# Patient Record
Sex: Female | Born: 1970 | Hispanic: No | Smoking: Current every day smoker
Health system: Southern US, Community
[De-identification: ages and names within clinical notes are randomized; demographics above are authoritative.]

## PROBLEM LIST (undated history)

## (undated) DIAGNOSIS — E119 Type 2 diabetes mellitus without complications: Secondary | ICD-10-CM

## (undated) DIAGNOSIS — J45909 Unspecified asthma, uncomplicated: Secondary | ICD-10-CM

## (undated) DIAGNOSIS — E785 Hyperlipidemia, unspecified: Secondary | ICD-10-CM

## (undated) HISTORY — PX: FEMUR SURGERY: SHX943

## (undated) HISTORY — PX: ARTHROSCOPIC REPAIR ACL: SUR80

---

## 2013-01-02 ENCOUNTER — Other Ambulatory Visit: Payer: Self-pay | Admitting: Family Medicine

## 2013-01-02 DIAGNOSIS — Z1231 Encounter for screening mammogram for malignant neoplasm of breast: Secondary | ICD-10-CM

## 2013-01-24 ENCOUNTER — Ambulatory Visit: Payer: Self-pay

## 2013-05-06 ENCOUNTER — Encounter (HOSPITAL_COMMUNITY): Payer: Self-pay | Admitting: *Deleted

## 2013-05-06 ENCOUNTER — Emergency Department (HOSPITAL_COMMUNITY)
Admission: EM | Admit: 2013-05-06 | Discharge: 2013-05-07 | Disposition: A | Discharge status: Home / Self Care | Payer: Medicare PPO | Attending: Emergency Medicine | Admitting: Emergency Medicine

## 2013-05-06 DIAGNOSIS — K0889 Other specified disorders of teeth and supporting structures: Secondary | ICD-10-CM

## 2013-05-06 DIAGNOSIS — Z880 Allergy status to penicillin: Secondary | ICD-10-CM | POA: Insufficient documentation

## 2013-05-06 DIAGNOSIS — F17200 Nicotine dependence, unspecified, uncomplicated: Secondary | ICD-10-CM | POA: Insufficient documentation

## 2013-05-06 DIAGNOSIS — J45909 Unspecified asthma, uncomplicated: Secondary | ICD-10-CM | POA: Insufficient documentation

## 2013-05-06 DIAGNOSIS — Z79899 Other long term (current) drug therapy: Secondary | ICD-10-CM | POA: Insufficient documentation

## 2013-05-06 DIAGNOSIS — K089 Disorder of teeth and supporting structures, unspecified: Secondary | ICD-10-CM | POA: Insufficient documentation

## 2013-05-06 HISTORY — DX: Unspecified asthma, uncomplicated: J45.909

## 2013-05-06 MED ORDER — OXYCODONE-ACETAMINOPHEN 5-325 MG PO TABS
2.0000 | ORAL_TABLET | Freq: Once | ORAL | Status: AC
  Administered 2013-05-06: 2 via ORAL
  Filled 2013-05-06: qty 2

## 2013-05-06 MED ORDER — AZITHROMYCIN 250 MG PO TABS: 250.0000 mg | ORAL_TABLET | Freq: Every day | ORAL | Status: DC

## 2013-05-06 MED ORDER — DIPHENHYDRAMINE HCL 25 MG PO TABS: 25.0000 mg | ORAL_TABLET | Freq: Four times a day (QID) | ORAL | Status: DC | PRN

## 2013-05-06 MED ORDER — ONDANSETRON 4 MG PO TBDP
8.0000 mg | ORAL_TABLET | Freq: Once | ORAL | Status: AC
  Administered 2013-05-06: 8 mg via ORAL
  Filled 2013-05-06: qty 2

## 2013-05-06 MED ORDER — OXYCODONE-ACETAMINOPHEN 5-325 MG PO TABS: 2.0000 | ORAL_TABLET | Freq: Four times a day (QID) | ORAL | Status: DC | PRN

## 2013-05-06 MED ORDER — PENICILLIN V POTASSIUM 500 MG PO TABS: 500.0000 mg | ORAL_TABLET | Freq: Four times a day (QID) | ORAL | Status: DC

## 2013-05-06 MED ORDER — PROMETHAZINE HCL 25 MG PO TABS: 25.0000 mg | ORAL_TABLET | Freq: Four times a day (QID) | ORAL | Status: DC | PRN

## 2013-05-06 NOTE — ED Notes (Signed)
Abscess tooth in the lower front. ? Need for dental extraction.

## 2013-05-06 NOTE — ED Provider Notes (Signed)
History    CSN: [id number] Arrival date & time 05/06/13  1931  First MD Initiated Contact with Patient 05/06/13 2208     Chief Complaint  Patient presents with  . Dental Pain   (Consider location/radiation/quality/duration/timing/severity/associated sxs/prior Treatment) HPI Comments: Patient is a 42 year old female with history of teeth extraction who presents today with lower front tooth pain since yesterday. It is a dull ache without radiation. She states there was draining pus earlier today. She has not done anything for the pain. She called a few dentist offices, but states she cannot afford the $200 they are asking for. No fevers, chills, nausea, vomiting, abdominal pain.  Patient is a 42 y.o. female presenting with tooth pain. The history is provided by the patient. No language interpreter was used.  Dental Pain Associated symptoms: no drooling and no fever    Past Medical History  Diagnosis Date  . Asthma    Past Surgical History  Procedure Laterality Date  . Arthroscopic repair acl    . Femur surgery     No family history on file. History  Substance Use Topics  . Smoking status: Current Every Day Smoker  . Smokeless tobacco: Not on file  . Alcohol Use: No   OB History   Grav Para Term Preterm Abortions TAB SAB Ect Mult Living                 Review of Systems  Constitutional: Negative for fever and chills.  HENT: Positive for dental problem. Negative for drooling.   Gastrointestinal: Negative for nausea, vomiting and abdominal pain.  All other systems reviewed and are negative.    Allergies  Contrast media; Penicillins; and Zithromax  Home Medications   Current Outpatient Rx  Name  Route  Sig  Dispense  Refill  . albuterol (PROVENTIL HFA;VENTOLIN HFA) 108 (90 BASE) MCG/ACT inhaler   Inhalation   Inhale 2 puffs into the lungs every 6 (six) hours as needed for wheezing. For wheezing         . ibuprofen (ADVIL,MOTRIN) 200 MG tablet   Oral   Take  1,000 mg by mouth 2 (two) times daily as needed for pain. For pain         . naproxen sodium (ANAPROX) 220 MG tablet   Oral   Take 220 mg by mouth daily as needed. For pain          BP 143/87  Pulse 97  Temp(Src) 98.2 F (36.8 C) (Oral)  Resp 16  SpO2 100%  LMP 04/09/2013 Physical Exam  Nursing note and vitals reviewed. Constitutional: She is oriented to person, place, and time. She appears well-developed and well-nourished. No distress.  HENT:  Head: Normocephalic and atraumatic.  Right Ear: External ear normal.  Left Ear: External ear normal.  Nose: Nose normal.  Mouth/Throat: Uvula is midline and oropharynx is clear and moist.    No trismus, submental edema, tongue elevation Gum erythema; no drainable abscess  Eyes: Conjunctivae are normal.  Neck: Normal range of motion.  Cardiovascular: Normal rate, regular rhythm and normal heart sounds.   Pulmonary/Chest: Effort normal and breath sounds normal. No stridor. No respiratory distress. She has no wheezes. She has no rales.  Abdominal: Soft. She exhibits no distension.  Musculoskeletal: Normal range of motion.  Neurological: She is alert and oriented to person, place, and time. She has normal strength.  Skin: Skin is warm and dry. She is not diaphoretic. No erythema.  Psychiatric: She has a normal mood  and affect. Her behavior is normal.    ED Course  Procedures (including critical care time) Labs Reviewed - No data to display  No results found.  1. Pain, dental     MDM  Patient with toothache.  No gross abscess.  Exam unconcerning for Ludwig's angina or spread of infection.  Will treat with azithromycin due to pcn allergy and pain medicine.  Urged patient to follow-up with dentist.     Mora Bellman, PA-C 05/07/13 0117

## 2013-05-08 NOTE — ED Provider Notes (Signed)
Medical screening examination/treatment/procedure(s) were performed by non-physician practitioner and as supervising physician I was immediately available for consultation/collaboration.  Taiquan Campanaro R. Marialena Wollen, MD 05/08/13 1552 

## 2013-07-29 ENCOUNTER — Emergency Department (HOSPITAL_COMMUNITY)
Admission: EM | Admit: 2013-07-29 | Discharge: 2013-07-30 | Disposition: A | Discharge status: Home / Self Care | Payer: Medicare PPO | Attending: Emergency Medicine | Admitting: Emergency Medicine

## 2013-07-29 ENCOUNTER — Encounter (HOSPITAL_COMMUNITY): Payer: Self-pay | Admitting: Emergency Medicine

## 2013-07-29 DIAGNOSIS — M25551 Pain in right hip: Secondary | ICD-10-CM

## 2013-07-29 DIAGNOSIS — M545 Low back pain, unspecified: Secondary | ICD-10-CM | POA: Insufficient documentation

## 2013-07-29 DIAGNOSIS — J45909 Unspecified asthma, uncomplicated: Secondary | ICD-10-CM | POA: Insufficient documentation

## 2013-07-29 DIAGNOSIS — M25559 Pain in unspecified hip: Secondary | ICD-10-CM | POA: Insufficient documentation

## 2013-07-29 DIAGNOSIS — Z888 Allergy status to other drugs, medicaments and biological substances status: Secondary | ICD-10-CM | POA: Insufficient documentation

## 2013-07-29 DIAGNOSIS — Z880 Allergy status to penicillin: Secondary | ICD-10-CM | POA: Insufficient documentation

## 2013-07-29 DIAGNOSIS — Z79899 Other long term (current) drug therapy: Secondary | ICD-10-CM | POA: Insufficient documentation

## 2013-07-29 DIAGNOSIS — Z881 Allergy status to other antibiotic agents status: Secondary | ICD-10-CM | POA: Insufficient documentation

## 2013-07-29 DIAGNOSIS — F411 Generalized anxiety disorder: Secondary | ICD-10-CM | POA: Insufficient documentation

## 2013-07-29 DIAGNOSIS — F17200 Nicotine dependence, unspecified, uncomplicated: Secondary | ICD-10-CM | POA: Insufficient documentation

## 2013-07-29 DIAGNOSIS — E119 Type 2 diabetes mellitus without complications: Secondary | ICD-10-CM | POA: Insufficient documentation

## 2013-07-29 DIAGNOSIS — M549 Dorsalgia, unspecified: Secondary | ICD-10-CM

## 2013-07-29 HISTORY — DX: Type 2 diabetes mellitus without complications: E11.9

## 2013-07-29 MED ORDER — OXYCODONE-ACETAMINOPHEN 5-325 MG PO TABS
2.0000 | ORAL_TABLET | Freq: Once | ORAL | Status: AC
  Administered 2013-07-29: 2 via ORAL
  Filled 2013-07-29: qty 2

## 2013-07-29 NOTE — ED Provider Notes (Signed)
CSN: [id number]     Arrival date & time 07/29/13  1853 History  This chart was scribed for non-physician practitioner, Coral Ceo, PA-C working with Raeford Razor, MD by Greggory Stallion, ED scribe. This patient was seen in room TR10C/TR10C and the patient's care was started at 10:55 PM.   Chief Complaint  Patient presents with  . Back Pain  . Hip Pain   The history is provided by the patient. No language interpreter was used.    HPI Comments: Kathleen Marks is a 42 y.o. female with a PMH of DM and asthma who presents to the Emergency Department complaining of back and hip pain.  Her pain has been present for several months after a work related injury.  She also makes some comment about a right hip problem prior to this injury, "I think I broke it" in the past but is unable to elaborate.  She states that she went to physical therapy today for her back pain. When she was getting out of the car after physical therapy she felt a "pop" in her right hip.  She did not fall.  She has been able to ambulate after this.  She is worried she "dislocated or fractured" her hip.  Her pain is located in the right lower back with radiation to her right hip.  Her pain is worse with movement and ambulation.  She has been taking hydrocodone and Meloxicam with no relief at home.  She took a hydrocodone prior to arrival.  Pt denies urinary or bowel incontinence, loss of sensation, numbness, tingling, or weakness. No fevers, chills, change in appetite/activity, chest pain, SOB, abdominal pain, nausea, emesis, dysuria, hematuria, or leg edema.     Past Medical History  Diagnosis Date  . Asthma   . Diabetes mellitus without complication    Past Surgical History  Procedure Laterality Date  . Arthroscopic repair acl    . Femur surgery     No family history on file. History  Substance Use Topics  . Smoking status: Current Every Day Smoker  . Smokeless tobacco: Not on file  . Alcohol Use: No   OB History    Grav Para Term Preterm Abortions TAB SAB Ect Mult Living                 Review of Systems  Constitutional: Negative for fever, chills, activity change, appetite change and fatigue.  HENT: Negative for congestion, sore throat, rhinorrhea, neck pain and neck stiffness.   Eyes: Negative for visual disturbance.  Respiratory: Negative for cough and shortness of breath.   Cardiovascular: Negative for chest pain and leg swelling.  Gastrointestinal: Negative for nausea, vomiting and abdominal pain.  Genitourinary: Negative for dysuria and difficulty urinating.       Denies urinary or bowel incontinence.   Musculoskeletal: Positive for back pain. Negative for myalgias, joint swelling and gait problem.  Skin: Negative for rash and wound.  Neurological: Negative for dizziness, syncope, weakness, light-headedness, numbness and headaches.  All other systems reviewed and are negative.    Allergies  Contrast media; Penicillins; and Zithromax  Home Medications   Current Outpatient Rx  Name  Route  Sig  Dispense  Refill  . albuterol (PROVENTIL HFA;VENTOLIN HFA) 108 (90 BASE) MCG/ACT inhaler   Inhalation   Inhale 2 puffs into the lungs every 6 (six) hours as needed for wheezing or shortness of breath.          . diphenhydrAMINE (BENADRYL) 25 MG tablet   Oral  Take 25 mg by mouth every 6 (six) hours as needed for itching.         Marland Kitchen ibuprofen (ADVIL,MOTRIN) 200 MG tablet   Oral   Take 1,000 mg by mouth 2 (two) times daily as needed for pain.          . naproxen sodium (ANAPROX) 220 MG tablet   Oral   Take 220 mg by mouth daily as needed (pain).           BP 129/67  Pulse 93  Temp(Src) 98.1 F (36.7 C) (Oral)  Resp 18  SpO2 100%  LMP 07/25/2013  Filed Vitals:   07/29/13 1921 07/29/13 2305 07/30/13 0129  BP: 129/67 150/98 134/86  Pulse: 93 101 83  Temp: 98.1 F (36.7 C)  97 F (36.1 C)  TempSrc: Oral Oral Oral  Resp: 18 20   SpO2: 100% 100%     Physical Exam   Nursing note and vitals reviewed. Constitutional: She is oriented to person, place, and time. She appears well-developed and well-nourished. No distress.  Patient is anxious and crying   HENT:  Head: Normocephalic and atraumatic.  Right Ear: External ear normal.  Left Ear: External ear normal.  Nose: Nose normal.  Eyes: Conjunctivae and EOM are normal. Right eye exhibits no discharge. Left eye exhibits no discharge.  Neck: Normal range of motion. Neck supple. No tracheal deviation present.  Cardiovascular: Normal rate, regular rhythm, normal heart sounds and intact distal pulses.  Exam reveals no gallop and no friction rub.   No murmur heard. Dorsalis pedis pulses present and equal bilaterally  Pulmonary/Chest: Effort normal and breath sounds normal. No respiratory distress. She has no wheezes. She has no rales. She exhibits no tenderness.  Abdominal: Soft. Bowel sounds are normal. She exhibits no distension. There is no tenderness.  Musculoskeletal: Normal range of motion. She exhibits tenderness. She exhibits no edema.  Diffuse tenderness to palpation to the lower lumbar right paraspinal muscles with no lumbar spinal tenderness.  Diffuse tenderness to palpation of the right lateral hip.  No thigh, knee, or ankle tenderness to palpation on the right.  Patient able to flex and extend right knee without difficulty or limitations.  Dorsiflexion and plantarflexion intact.  No pedal edema bilaterally  Neurological: She is alert and oriented to person, place, and time.  Gross sensation intact in the lower extremities bilaterally  Skin: Skin is warm and dry. She is not diaphoretic.  No ecchymosis, edema, erythema, or wounds to the lower back or lower extremities throughout  Psychiatric: She has a normal mood and affect. Her behavior is normal.    ED Course  Procedures (including critical care time)  DIAGNOSTIC STUDIES: Oxygen Saturation is 100% on RA, normal by my interpretation.     COORDINATION OF CARE: 10:57 PM-Discussed treatment plan which includes percocet with pt at bedside and pt agreed to plan.   Labs Review Labs Reviewed - No data to display Imaging Review No results found.  DG Hip Complete Right (Final result)  Result time: 07/30/13 00:51:47    Final result by Rad Results In Interface (07/30/13 00:51:47)    Narrative:   CLINICAL DATA: Right hip and low back pain. No acute injury. Patient reports right hip injury in 2007.  EXAM: RIGHT HIP - COMPLETE 2+ VIEW  COMPARISON: None.  FINDINGS: The mineralization and alignment are normal. There is no evidence of acute fracture or dislocation. The hip joint spaces are preserved. There is no evidence of femoral head avascular  necrosis. Mild osteitis pubis and mild sacroiliac degenerative changes are present bilaterally.  IMPRESSION: No acute osseous findings. Mild osteitis pubis.   Electronically Signed By: Roxy Horseman On: 07/30/2013 00:51             DG Lumbar Spine Complete (Final result)  Result time: 07/30/13 00:50:58    Final result by Rad Results In Interface (07/30/13 00:50:58)    Narrative:   CLINICAL DATA: Low back pain on the right.  EXAM: LUMBAR SPINE - COMPLETE 4+ VIEW  COMPARISON: None.  FINDINGS: No fracture or subluxation is identified. Intervertebral disc space height is maintained. There is some facet arthropathy at L4-5 and L5-S1.  IMPRESSION: No acute or focal abnormality. Facet degenerative change lower lumbar spine.   Electronically Signed By: Drusilla Kanner M.D. On: 07/30/2013 00:50   Results for orders placed during the hospital encounter of 07/29/13  GLUCOSE, CAPILLARY      Result Value Range   Glucose-Capillary 127 (*) 70 - 99 mg/dL    MDM   1. Back pain   2. Right hip pain    Merlean Bucio is a 42 y.o. female with a PMH of DM and asthma who presents to the Emergency Department complaining of back and hip pain.  X-rays of the lumbar  spine and right hip were ordered to further evaluate.  Percocet was ordered for symptomatic relief.     Rechecks  12:45 AM = Patient appears more comfortable.  States her pain is improved but she is now nauseated.  ODT Zofran ordered.  Patient had an episode of emesis while I was in the room.  Able to sit up with ease.   1:15 AM = Patient states nausea has improved.  She states "pain medications make me sick."  She is ambulating around the room without difficulty or ataxia.  Ready for discharge.     Etiology of back and hip pain is likely chronic in nature.  X-rays were negative for fracture or malalignment.  There is no mention of previous fx or surgical hardware on x-rays.  Patient was neurovascularly intact.  She was able to ambulate without difficulty or ataxia before discharge.  Patient was prescribed Zofran and Flexeril for outpatient management.  Patient states she has enough pain medication at home.  She was instructed not to drink or drive while taking Flexeril.  Patient was instructed to return to the ED if they experience any weakness, loss of bowel or bladder function, or other concerns.  Patient will call her case manager regarding her workman's comp plan of further care.  Patient was in agreement with discharge and plan.     Final impressions: 1. Back pain 2. Right hip pain     Thomasenia Sales   I personally performed the services described in this documentation, which was scribed in my presence. The recorded information has been reviewed and is accurate.   Jillyn Ledger, PA-C 07/30/13 1515

## 2013-07-29 NOTE — ED Notes (Signed)
Pt upset requesting something for pain.  This RN informed pt that she needed to be seen first by provider before medication can be administered.  Pt visibly upset stating "You don't seem to understand.  I was outside for 2 hours and you guys let me stay in pain out there.  Now I am back here and I am still in pain and haven't been seen by anybody."  This RN attempted to explain to pt that this area was busy and the provider was trying to work as fast as she could to see pts.  "You don't seem to care.  Please go get me your supervisor."  Charge nurse made aware of pt's request.

## 2013-07-29 NOTE — ED Notes (Addendum)
Pt. Reports low back pain for 2 months and right hip pain today after receiving physical therapy , currently taking Hydrocodone and Meloxicam with no relief . Denies urinary discomfort.

## 2013-07-30 ENCOUNTER — Emergency Department (HOSPITAL_COMMUNITY): Payer: Medicare PPO

## 2013-07-30 LAB — GLUCOSE, CAPILLARY: Glucose-Capillary: 127 mg/dL — ABNORMAL HIGH (ref 70–99)

## 2013-07-30 MED ORDER — ONDANSETRON 4 MG PO TBDP: 4.0000 mg | ORAL_TABLET | Freq: Three times a day (TID) | ORAL | Status: DC | PRN

## 2013-07-30 MED ORDER — CYCLOBENZAPRINE HCL 5 MG PO TABS: 5.0000 mg | ORAL_TABLET | Freq: Three times a day (TID) | ORAL | Status: DC | PRN

## 2013-07-30 MED ORDER — ONDANSETRON 4 MG PO TBDP
4.0000 mg | ORAL_TABLET | Freq: Once | ORAL | Status: AC
  Administered 2013-07-30: 4 mg via ORAL
  Filled 2013-07-30: qty 1

## 2013-07-30 NOTE — ED Notes (Signed)
Family at bedside. 

## 2013-08-01 NOTE — ED Provider Notes (Signed)
Medical screening examination/treatment/procedure(s) were performed by non-physician practitioner and as supervising physician I was immediately available for consultation/collaboration.  Tamico Mundo, MD 08/01/13 0612 

## 2014-05-21 ENCOUNTER — Encounter (HOSPITAL_COMMUNITY): Payer: Self-pay | Admitting: Emergency Medicine

## 2014-05-21 ENCOUNTER — Emergency Department (HOSPITAL_COMMUNITY): Payer: Medicare Other

## 2014-05-21 ENCOUNTER — Emergency Department (HOSPITAL_COMMUNITY)
Admission: EM | Admit: 2014-05-21 | Discharge: 2014-05-21 | Disposition: A | Discharge status: Home / Self Care | Payer: Medicare Other | Attending: Emergency Medicine | Admitting: Emergency Medicine

## 2014-05-21 DIAGNOSIS — Z880 Allergy status to penicillin: Secondary | ICD-10-CM | POA: Insufficient documentation

## 2014-05-21 DIAGNOSIS — J069 Acute upper respiratory infection, unspecified: Secondary | ICD-10-CM | POA: Insufficient documentation

## 2014-05-21 DIAGNOSIS — F17200 Nicotine dependence, unspecified, uncomplicated: Secondary | ICD-10-CM | POA: Insufficient documentation

## 2014-05-21 DIAGNOSIS — Z791 Long term (current) use of non-steroidal anti-inflammatories (NSAID): Secondary | ICD-10-CM | POA: Insufficient documentation

## 2014-05-21 DIAGNOSIS — Z79899 Other long term (current) drug therapy: Secondary | ICD-10-CM | POA: Insufficient documentation

## 2014-05-21 DIAGNOSIS — E119 Type 2 diabetes mellitus without complications: Secondary | ICD-10-CM | POA: Insufficient documentation

## 2014-05-21 DIAGNOSIS — J45901 Unspecified asthma with (acute) exacerbation: Secondary | ICD-10-CM | POA: Insufficient documentation

## 2014-05-21 MED ORDER — IPRATROPIUM-ALBUTEROL 0.5-2.5 (3) MG/3ML IN SOLN
3.0000 mL | Freq: Once | RESPIRATORY_TRACT | Status: AC
  Administered 2014-05-21: 3 mL via RESPIRATORY_TRACT
  Filled 2014-05-21: qty 3

## 2014-05-21 MED ORDER — PREDNISONE 20 MG PO TABS
60.0000 mg | ORAL_TABLET | Freq: Once | ORAL | Status: AC
  Administered 2014-05-21: 60 mg via ORAL
  Filled 2014-05-21: qty 3

## 2014-05-21 MED ORDER — ALBUTEROL SULFATE HFA 108 (90 BASE) MCG/ACT IN AERS: 2.0000 | INHALATION_SPRAY | RESPIRATORY_TRACT | Status: DC | PRN

## 2014-05-21 MED ORDER — GUAIFENESIN-CODEINE 100-10 MG/5ML PO SOLN
10.0000 mL | Freq: Once | ORAL | Status: AC
  Administered 2014-05-21: 10 mL via ORAL
  Filled 2014-05-21: qty 10

## 2014-05-21 MED ORDER — GUAIFENESIN-CODEINE 100-10 MG/5ML PO SOLN: 10.0000 mL | Freq: Four times a day (QID) | ORAL | Status: DC | PRN

## 2014-05-21 MED ORDER — PREDNISONE 20 MG PO TABS: 40.0000 mg | ORAL_TABLET | Freq: Every day | ORAL | Status: DC

## 2014-05-21 NOTE — Discharge Instructions (Signed)
Please follow up with your primary care physician in 1-2 days. If you do not have one please [facility] the [facility] listed above. Sick medications as prescribed. Please use her albuterol inhaler 1-2 puffs every 4-6 hours for the next few days to help with cough and respiratory symptoms. Please do not drive after taking Robitussin with codeine. Please read all discharge instructions and return precautions.   Upper Respiratory Infection, Adult An upper respiratory infection (URI) is also known as the common cold. It is often caused by a type of germ (virus). Colds are easily spread (contagious). You can pass it to others by kissing, coughing, sneezing, or drinking out of the same glass. Usually, you get better in 1 or 2 weeks.  HOME CARE   Only take medicine as told by your doctor.  Use a warm mist humidifier or breathe in steam from a hot shower.  Drink enough water and fluids to keep your pee (urine) clear or pale yellow.  Get plenty of rest.  Return to work when your temperature is back to normal or as told by your doctor. You may use a face mask and wash your hands to stop your cold from spreading. GET HELP RIGHT AWAY IF:   After the first few days, you feel you are getting worse.  You have questions about your medicine.  You have chills, shortness of breath, or brown or red spit (mucus).  You have yellow or brown snot (nasal discharge) or pain in the face, especially when you bend forward.  You have a fever, puffy (swollen) neck, pain when you swallow, or white spots in the back of your throat.  You have a bad headache, ear pain, sinus pain, or chest pain.  You have a high-pitched whistling sound when you breathe in and out (wheezing).  You have a lasting cough or cough up blood.  You have sore muscles or a stiff neck. MAKE SURE YOU:   Understand these instructions.  Will watch your condition.  Will get help right away if you are not doing well or  get worse. Document Released: 04/11/2008 Document Revised: 01/16/2012 Document Reviewed: 02/28/2011 [facility] Patient Information 2015 [place], [place]. This information is not intended to replace advice given to you by your health care provider. Make sure you discuss any questions you have with your health care provider.

## 2014-05-21 NOTE — ED Provider Notes (Signed)
Medical screening examination/treatment/procedure(s) were performed by non-physician practitioner and as supervising physician I was immediately available for consultation/collaboration.   EKG Interpretation None       Jerah Esty K Demarqus Jocson-Rasch, MD 05/21/14 0600

## 2014-05-21 NOTE — ED Provider Notes (Signed)
CSN: [id number]     Arrival date & time 05/21/14  0438 History   First MD Initiated Contact with Patient 05/21/14 [phone]     Chief Complaint  Patient presents with  . Shortness of Breath     (Consider location/radiation/quality/duration/timing/severity/associated sxs/prior Treatment) HPI Comments: Patient is a 43 year old female past medical history significant for asthma, DM, tobacco abuse presenting to the emergency department for 4-5 day history of nasal congestion, rhinorrhea, productive cough with yellow sputum, shortness of breath chest tightness. She states her symptoms have been gradually worsening. Alleviating factors: none. Aggravating factors: laying down at night, exertion. Medications tried prior to arrival: Robitussin, Tussionex, Mucinex. Patient states her daughter and husband are both sick at home with similar symptoms. Denies any fevers, chills, nausea, vomiting, diarrhea, abdominal pain. PERC negative.     Patient is a 43 y.o. female presenting with shortness of breath.  Shortness of Breath Associated symptoms: wheezing   Associated symptoms: no abdominal pain, no fever and no vomiting     Past Medical History  Diagnosis Date  . Asthma   . Diabetes mellitus without complication    Past Surgical History  Procedure Laterality Date  . Arthroscopic repair acl    . Femur surgery     No family history on file. History  Substance Use Topics  . Smoking status: Current Every Day Smoker -- 0.50 packs/day    Types: Cigarettes  . Smokeless tobacco: Not on file  . Alcohol Use: No   OB History   Grav Para Term Preterm Abortions TAB SAB Ect Mult Living                 Review of Systems  Constitutional: Negative for fever and chills.  Respiratory: Positive for chest tightness, shortness of breath and wheezing.   Gastrointestinal: Negative for nausea, vomiting and abdominal pain.  All other systems reviewed and are negative.     Allergies  Contrast media;  Penicillins; Tetanus toxoids; and Zithromax  Home Medications   Prior to Admission medications   Medication Sig Start Date End Date Taking? Authorizing Provider  acetaminophen (TYLENOL) 500 MG tablet Take 500-1,000 mg by mouth every 6 (six) hours as needed.   Yes Historical Provider, MD  diphenhydrAMINE (BENADRYL) 25 MG tablet Take 25 mg by mouth every 6 (six) hours as needed for itching.   Yes Historical Provider, MD  guaiFENesin (MUCINEX) 600 MG 12 hr tablet Take 600 mg by mouth 2 (two) times daily as needed. For cold symptoms   Yes Historical Provider, MD  guaifenesin (ROBITUSSIN) 100 MG/5ML syrup Take 200 mg by mouth 3 (three) times daily as needed for cough.   Yes Historical Provider, MD  ibuprofen (ADVIL,MOTRIN) 200 MG tablet Take 600-800 mg by mouth 2 (two) times daily as needed for pain.    Yes Historical Provider, MD  albuterol (PROVENTIL HFA;VENTOLIN HFA) 108 (90 BASE) MCG/ACT inhaler Inhale 2 puffs into the lungs every 6 (six) hours as needed for wheezing or shortness of breath.     Historical Provider, MD  albuterol (PROVENTIL HFA;VENTOLIN HFA) 108 (90 BASE) MCG/ACT inhaler Inhale 2 puffs into the lungs every 4 (four) hours as needed for wheezing or shortness of breath. 05/21/14   Altan Kraai L Canden Cieslinski, PA-C  guaiFENesin-codeine 100-10 MG/5ML syrup Take 10 mLs by mouth every 6 (six) hours as needed for cough. 05/21/14   Jhonatan Lomeli L Trevionne Advani, PA-C  predniSONE (DELTASONE) 20 MG tablet Take 2 tablets (40 mg total) by mouth daily. 05/21/14   Victorino Dike  L Velicia Dejager, PA-C   BP 136/82  Pulse 96  Temp(Src) 97.9 F (36.6 C) (Oral)  Resp 20  Ht 5\' 3"  (1.6 m)  Wt 270 lb (122.471 kg)  BMI 47.84 kg/m2  SpO2 98%  LMP 04/24/2014 Physical Exam  Nursing note and vitals reviewed. Constitutional: She is oriented to person, place, and time. She appears well-developed and well-nourished. No distress.  HENT:  Head: Normocephalic and atraumatic.  Right Ear: Hearing, tympanic membrane, external  ear and ear canal normal.  Left Ear: Hearing, tympanic membrane, external ear and ear canal normal.  Nose: Rhinorrhea present.  Mouth/Throat: Uvula is midline, oropharynx is clear and moist and mucous membranes are normal. No trismus in the jaw. No uvula swelling. No oropharyngeal exudate.  Eyes: Conjunctivae are normal.  Neck: Neck supple.  Cardiovascular: Normal rate, regular rhythm and normal heart sounds.   Pulmonary/Chest: Effort normal. No respiratory distress. She has wheezes (mild expiratory). She exhibits tenderness.  Abdominal: Soft. Bowel sounds are normal. There is no tenderness.  Musculoskeletal: She exhibits no edema.  MAE x 4  Lymphadenopathy:    She has no cervical adenopathy.  Neurological: She is alert and oriented to person, place, and time.  Skin: Skin is warm and dry. She is not diaphoretic.    ED Course  Procedures (including critical care time) Medications  ipratropium-albuterol (DUONEB) 0.5-2.5 (3) MG/3ML nebulizer solution 3 mL (3 mLs Nebulization Given 05/21/14 0524)  predniSONE (DELTASONE) tablet 60 mg (60 mg Oral Given 05/21/14 0524)  guaiFENesin-codeine 100-10 MG/5ML solution 10 mL (10 mLs Oral Given 05/21/14 0524)    Labs Review Labs Reviewed - No data to display  Imaging Review Dg Chest 2 View  05/21/2014   CLINICAL DATA:  Shortness of breath  EXAM: CHEST  2 VIEW  COMPARISON:  None.  FINDINGS: Normal heart size and mediastinal contours. No acute infiltrate or edema. No effusion or pneumothorax. No acute osseous findings.  IMPRESSION: No active cardiopulmonary disease.   Electronically Signed   By: Tiburcio PeaJonathan  Watts M.D.   On: 05/21/2014 05:31     EKG Interpretation None      MDM   Final diagnoses:  URI (upper respiratory infection)    Filed Vitals:   05/21/14 0444  BP: 136/82  Pulse: 96  Temp: 97.9 F (36.6 C)  Resp: 20     Afebrile, NAD, non-toxic appearing, AAOx4.  Pt CXR negative for acute infiltrate. Patients symptoms are consistent  with URI, likely viral etiology. Discussed that antibiotics are not indicated for viral infections. Mild expiratory wheeze improved with DuoNeb administration. Pt will be discharged with symptomatic treatment.  Verbalizes understanding and is agreeable with plan. Pt is hemodynamically stable & in NAD prior to dc. Patient d/w with Dr. Nicanor AlconPalumbo, agrees with plan.     Jeannetta EllisJennifer L Gladine Plude, PA-C 05/21/14 [phone]

## 2014-05-21 NOTE — ED Notes (Signed)
Pt states that she has had cold sx x 4-5 days; pt states that she has a history of asthma; pt states that she has become more short of breath over the last 4-5 days; pt reports productive cough with yellow colored sputum; no cough in triage; pt states the shortness of breath is worse with exertion

## 2014-09-08 IMAGING — CR DG CHEST 2V
2 series · 2 of 2 positions shown · non-contrast
Comparison: None.

CLINICAL DATA: Shortness of breath

EXAM:
CHEST  2 VIEW

[w chest pa]
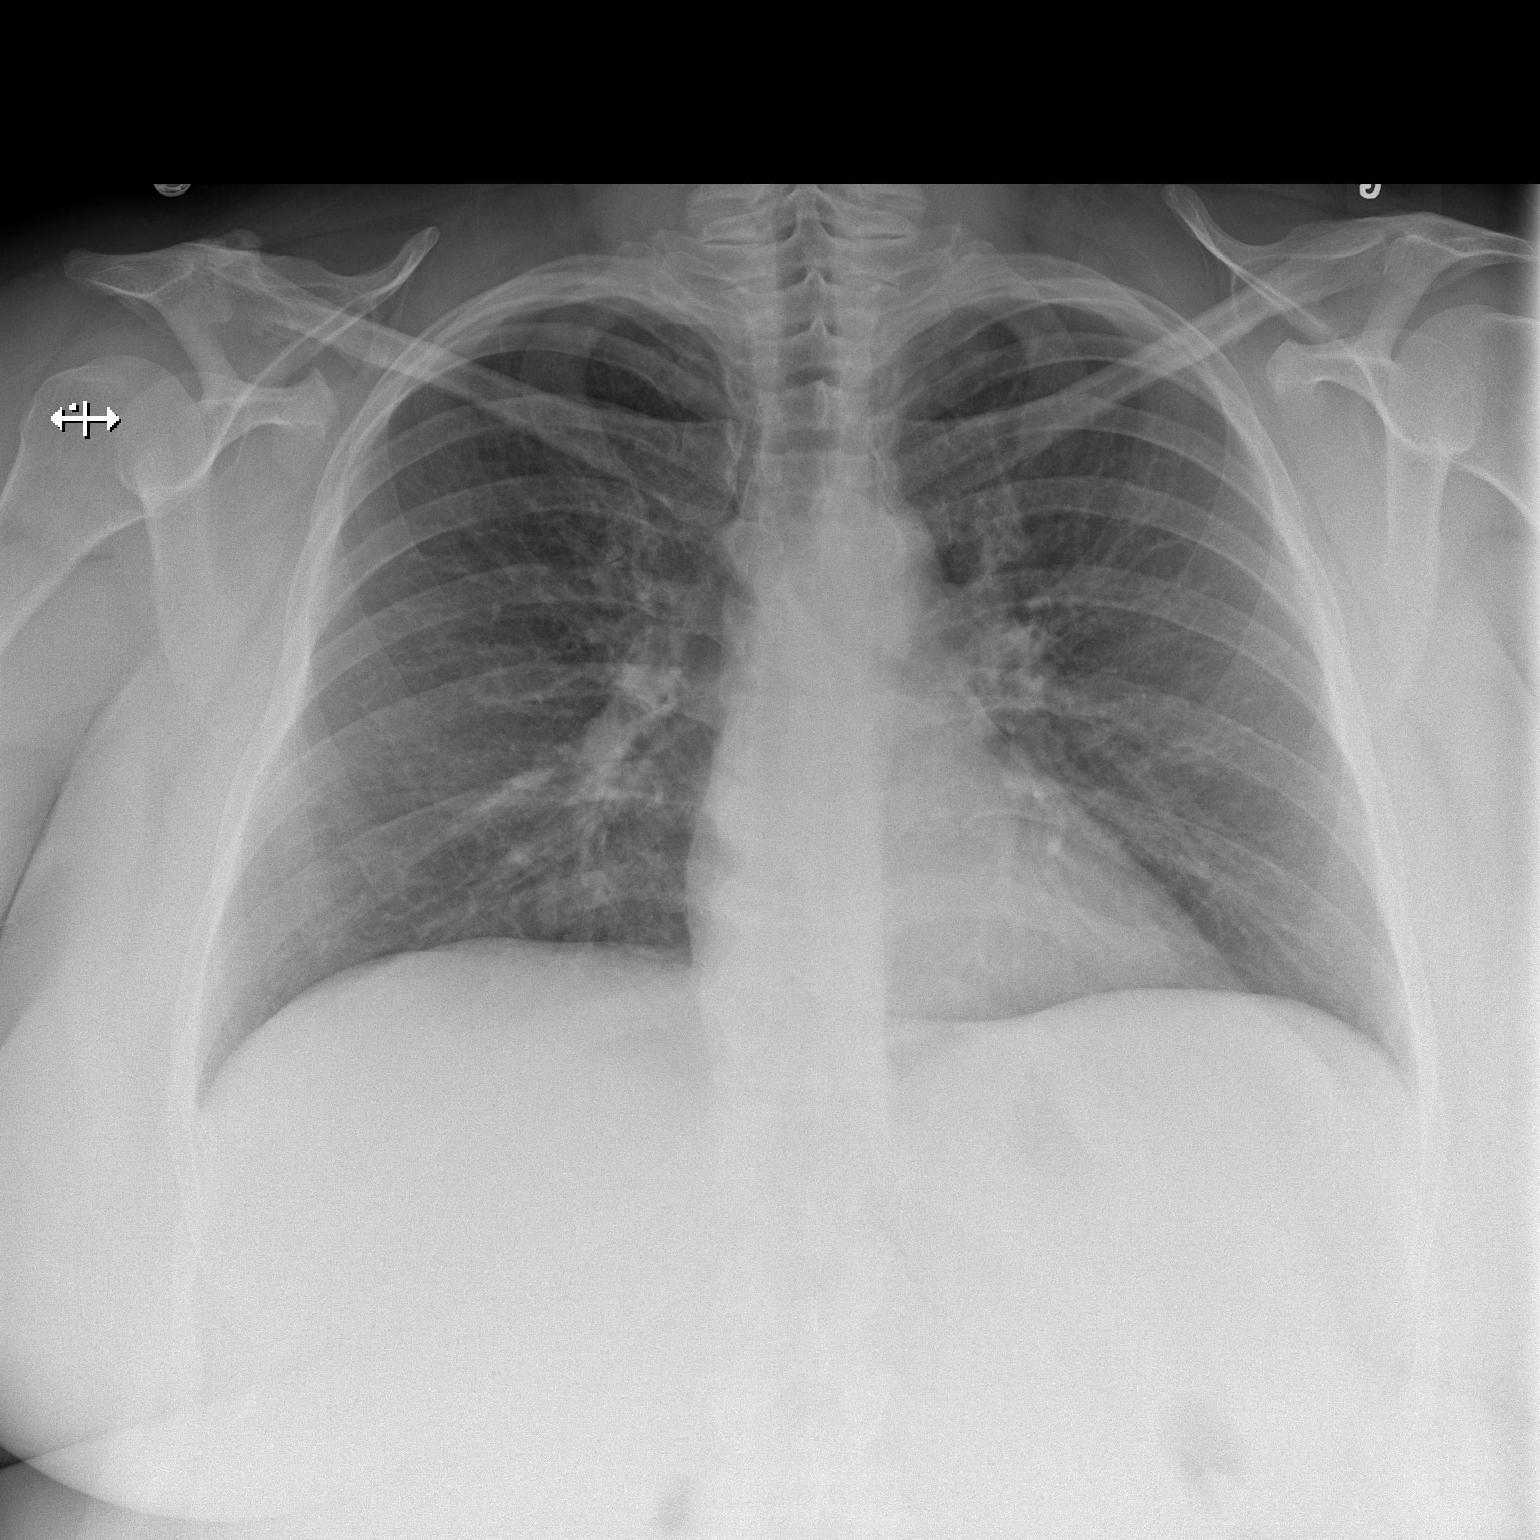

[w chest lat]
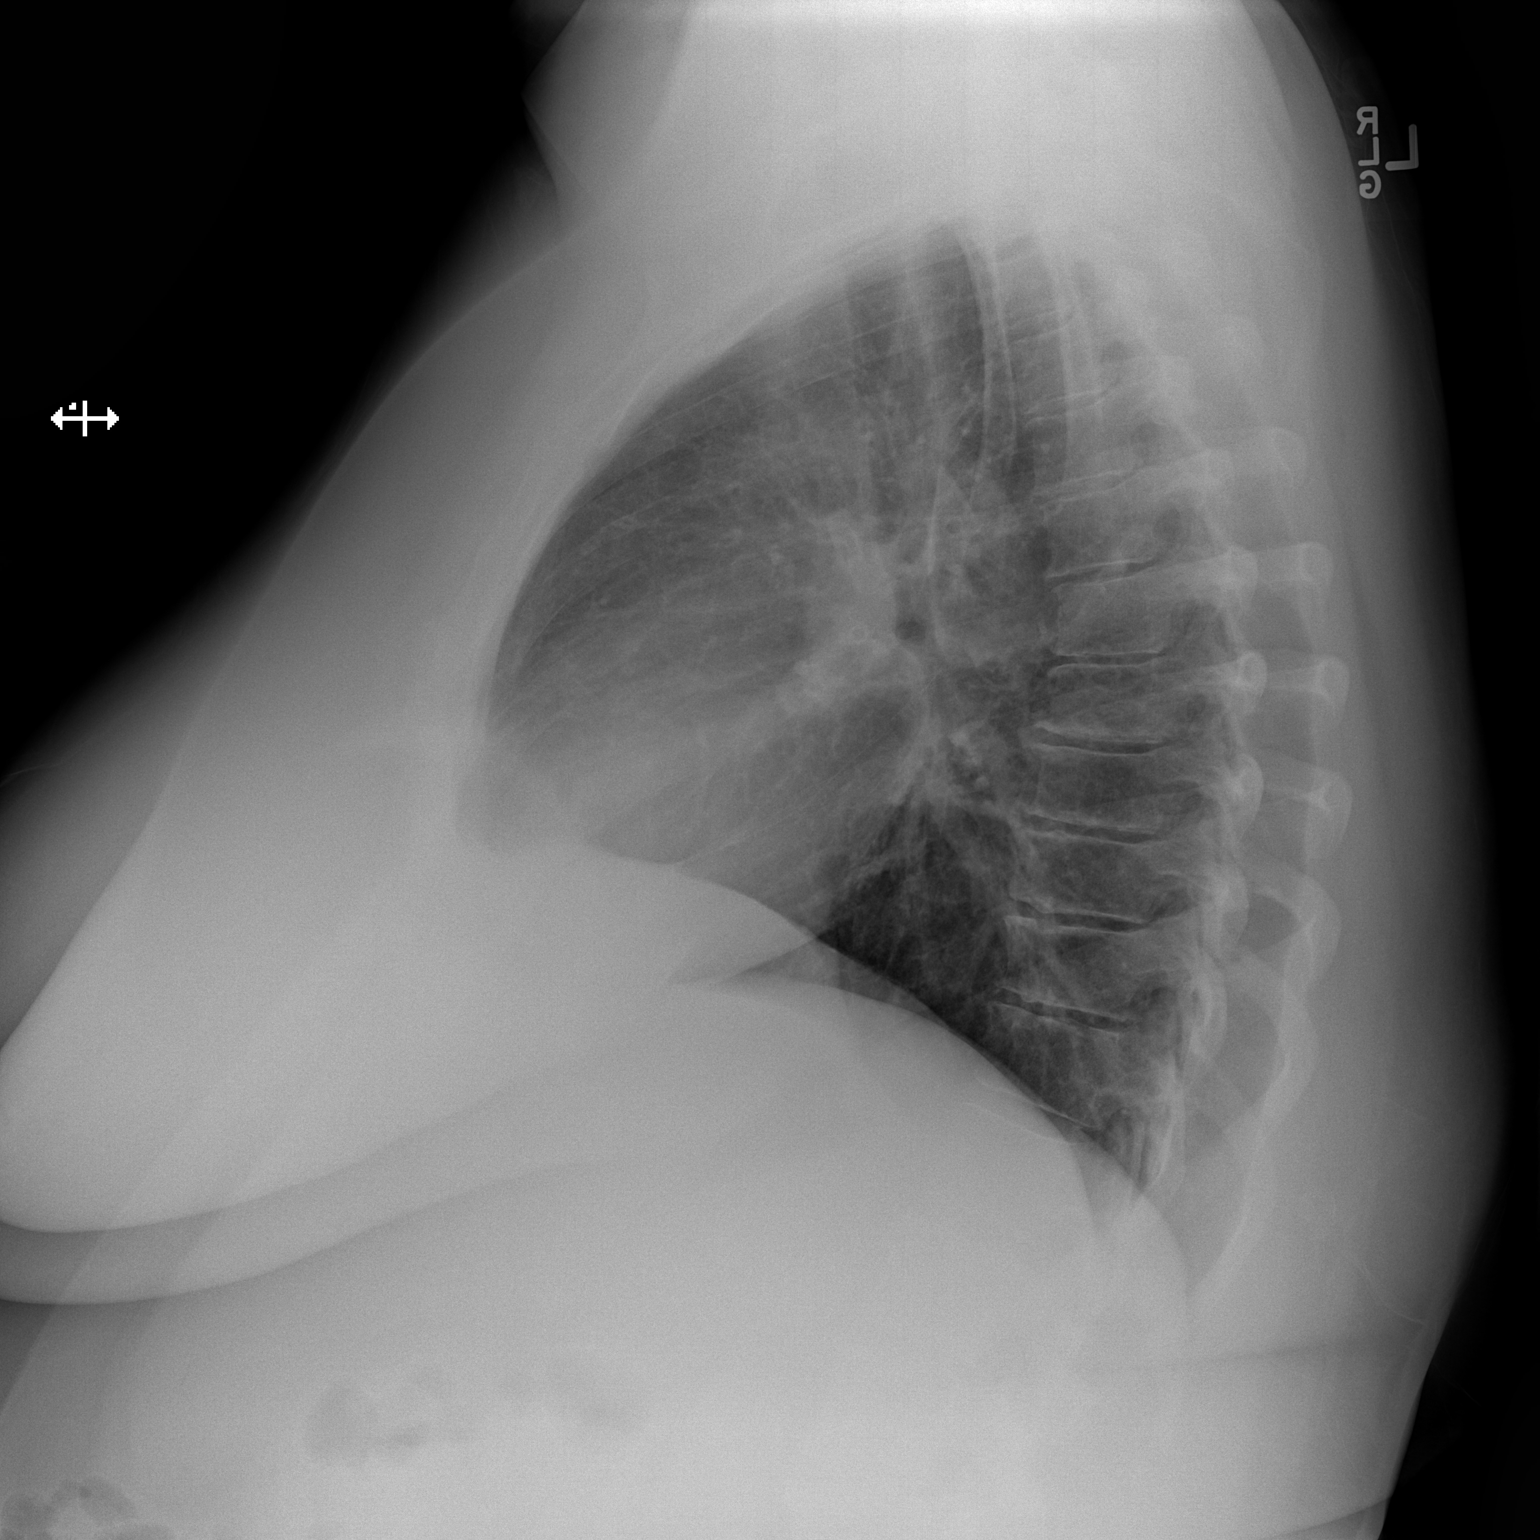

[2 of 2 positions shown; findings below may reference images not displayed]

FINDINGS: Normal heart size and mediastinal contours. No acute infiltrate or
edema. No effusion or pneumothorax. No acute osseous findings.
IMPRESSION: No active cardiopulmonary disease.

## 2014-10-25 ENCOUNTER — Emergency Department (HOSPITAL_COMMUNITY)
Admission: EM | Admit: 2014-10-25 | Discharge: 2014-10-25 | Disposition: A | Discharge status: Home / Self Care | Payer: Medicare Other | Attending: Emergency Medicine | Admitting: Emergency Medicine

## 2014-10-25 ENCOUNTER — Encounter (HOSPITAL_COMMUNITY): Payer: Self-pay | Admitting: *Deleted

## 2014-10-25 DIAGNOSIS — Z720 Tobacco use: Secondary | ICD-10-CM | POA: Diagnosis not present

## 2014-10-25 DIAGNOSIS — E1165 Type 2 diabetes mellitus with hyperglycemia: Secondary | ICD-10-CM | POA: Insufficient documentation

## 2014-10-25 DIAGNOSIS — J45909 Unspecified asthma, uncomplicated: Secondary | ICD-10-CM | POA: Diagnosis not present

## 2014-10-25 DIAGNOSIS — Z880 Allergy status to penicillin: Secondary | ICD-10-CM | POA: Insufficient documentation

## 2014-10-25 DIAGNOSIS — M549 Dorsalgia, unspecified: Secondary | ICD-10-CM | POA: Diagnosis not present

## 2014-10-25 DIAGNOSIS — Z79899 Other long term (current) drug therapy: Secondary | ICD-10-CM | POA: Diagnosis not present

## 2014-10-25 DIAGNOSIS — R200 Anesthesia of skin: Secondary | ICD-10-CM | POA: Insufficient documentation

## 2014-10-25 DIAGNOSIS — Z7952 Long term (current) use of systemic steroids: Secondary | ICD-10-CM | POA: Diagnosis not present

## 2014-10-25 DIAGNOSIS — R739 Hyperglycemia, unspecified: Secondary | ICD-10-CM

## 2014-10-25 DIAGNOSIS — R202 Paresthesia of skin: Secondary | ICD-10-CM

## 2014-10-25 LAB — CBC WITH DIFFERENTIAL/PLATELET
BASOS ABS: 0 10*3/uL (ref 0.0–0.1)
BASOS PCT: 0 % (ref 0–1)
EOS PCT: 2 % (ref 0–5)
Eosinophils Absolute: 0.2 10*3/uL (ref 0.0–0.7)
HCT: 40.2 % (ref 36.0–46.0)
Hemoglobin: 13.7 g/dL (ref 12.0–15.0)
LYMPHS PCT: 34 % (ref 12–46)
Lymphs Abs: 3.3 10*3/uL (ref 0.7–4.0)
MCH: 28.8 pg (ref 26.0–34.0)
MCHC: 34.1 g/dL (ref 30.0–36.0)
MCV: 84.5 fL (ref 78.0–100.0)
Monocytes Absolute: 0.5 10*3/uL (ref 0.1–1.0)
Monocytes Relative: 5 % (ref 3–12)
Neutro Abs: 5.7 10*3/uL (ref 1.7–7.7)
Neutrophils Relative %: 59 % (ref 43–77)
PLATELETS: 252 10*3/uL (ref 150–400)
RBC: 4.76 MIL/uL (ref 3.87–5.11)
RDW: 14.3 % (ref 11.5–15.5)
WBC: 9.7 10*3/uL (ref 4.0–10.5)

## 2014-10-25 LAB — BASIC METABOLIC PANEL
Anion gap: 14 (ref 5–15)
BUN: 10 mg/dL (ref 6–23)
CO2: 21 mEq/L (ref 19–32)
Calcium: 9.1 mg/dL (ref 8.4–10.5)
Chloride: 100 mEq/L (ref 96–112)
Creatinine, Ser: 0.56 mg/dL (ref 0.50–1.10)
GFR calc non Af Amer: >90 mL/min (ref >90)
Glucose, Bld: 274 mg/dL — ABNORMAL HIGH (ref 70–99)
Potassium: 4.8 mEq/L (ref 3.7–5.3)
SODIUM: 135 meq/L — AB (ref 137–147)

## 2014-10-25 NOTE — ED Notes (Addendum)
Pt also c/o intermittent numbness to L arm. No injury, sts numbness occurs randomly. Has been going on for a week.

## 2014-10-25 NOTE — ED Provider Notes (Signed)
CSN: [id number]     Arrival date & time 10/25/14  1620 History   First MD Initiated Contact with Patient 10/25/14 1741     Chief Complaint  Patient presents with  . Numbness     (Consider location/radiation/quality/duration/timing/severity/associated sxs/prior Treatment) HPI Comments: Patient with history of diabetes on metformin presents with complaint of bilateral thigh numbness 4 days and intermittent left arm numbness. Patient describes bilateral thigh numbness as though they are asleep. This has been constant. At times she will have a burning sensation in the same area. She does not have sensation problems in her feet but states they feel tight. She was going to wait to go see her PCP on Monday, but today the front of her abdomen had decreased sensation. No L arm numbness but this has been coming and going. She reports absolutely no extremity weakness or difficulty walking. She has right lower back pain which she states is chronic and unchanged in both character and severity. Patient denies warning symptoms of back pain including: fecal incontinence, urinary retention or overflow incontinence, night sweats, waking from sleep with back pain, unexplained fevers or weight loss, h/o cancer, IVDU, recent trauma. No eye pain or vision disturbance. No fevers or neck pain. No chest pain or shortness of breath. No abdominal pain. No diarrhea or urinary symptoms. Patient states that she does not check her sugars daily but has them checked 'every 3 months'. The onset of this condition was acute. The course is constant. Aggravating factors: none. Alleviating factors: none.     The history is provided by the patient.    Past Medical History  Diagnosis Date  . Asthma   . Diabetes mellitus without complication    Past Surgical History  Procedure Laterality Date  . Arthroscopic repair acl    . Femur surgery     No family history on file. History  Substance Use Topics  . Smoking status: Current  Every Day Smoker -- 0.50 packs/day    Types: Cigarettes  . Smokeless tobacco: Not on file  . Alcohol Use: No   OB History    No data available     Review of Systems  Constitutional: Negative for fever and unexpected weight change.  HENT: Negative for congestion, dental problem, rhinorrhea, sinus pressure and sore throat.   Eyes: Negative for photophobia, discharge, redness and visual disturbance.  Respiratory: Negative for cough and shortness of breath.   Cardiovascular: Negative for chest pain.  Gastrointestinal: Negative for nausea, vomiting, abdominal pain, diarrhea and constipation.       Negative for fecal incontinence.   Genitourinary: Negative for dysuria, hematuria, flank pain, vaginal bleeding, vaginal discharge and pelvic pain.       Negative for urinary incontinence or retention.  Musculoskeletal: Positive for back pain (chronic, unchanged, 'right quarter panel'). Negative for myalgias, gait problem, neck pain and neck stiffness.  Skin: Negative for rash.  Neurological: Positive for numbness. Negative for syncope, speech difficulty, weakness, light-headedness and headaches.       Denies saddle paresthesias.  Psychiatric/Behavioral: Negative for confusion.    Allergies  Contrast media; Penicillins; Tetanus toxoids; and Zithromax  Home Medications   Prior to Admission medications   Medication Sig Start Date End Date Taking? Authorizing Provider  acetaminophen (TYLENOL) 500 MG tablet Take 500-1,000 mg by mouth every 6 (six) hours as needed.    Historical Provider, MD  albuterol (PROVENTIL HFA;VENTOLIN HFA) 108 (90 BASE) MCG/ACT inhaler Inhale 2 puffs into the lungs every 6 (six) hours as  needed for wheezing or shortness of breath.     Historical Provider, MD  albuterol (PROVENTIL HFA;VENTOLIN HFA) 108 (90 BASE) MCG/ACT inhaler Inhale 2 puffs into the lungs every 4 (four) hours as needed for wheezing or shortness of breath. 05/21/14   Jennifer L Piepenbrink, PA-C   diphenhydrAMINE (BENADRYL) 25 MG tablet Take 25 mg by mouth every 6 (six) hours as needed for itching.    Historical Provider, MD  guaiFENesin (MUCINEX) 600 MG 12 hr tablet Take 600 mg by mouth 2 (two) times daily as needed. For cold symptoms    Historical Provider, MD  guaifenesin (ROBITUSSIN) 100 MG/5ML syrup Take 200 mg by mouth 3 (three) times daily as needed for cough.    Historical Provider, MD  guaiFENesin-codeine 100-10 MG/5ML syrup Take 10 mLs by mouth every 6 (six) hours as needed for cough. 05/21/14   Jennifer L Piepenbrink, PA-C  ibuprofen (ADVIL,MOTRIN) 200 MG tablet Take 600-800 mg by mouth 2 (two) times daily as needed for pain.     Historical Provider, MD  predniSONE (DELTASONE) 20 MG tablet Take 2 tablets (40 mg total) by mouth daily. 05/21/14   Jennifer L Piepenbrink, PA-C   BP 117/73 mmHg  Pulse 91  Temp(Src) 97.8 F (36.6 C) (Oral)  Resp 16  SpO2 95%  LMP 10/07/2014   Physical Exam  Constitutional: She appears well-developed and well-nourished.  HENT:  Head: Normocephalic and atraumatic.  Mouth/Throat: Oropharynx is clear and moist.  Eyes: Conjunctivae are normal. Right eye exhibits no discharge. Left eye exhibits no discharge.  Neck: Normal range of motion. Neck supple.  Cardiovascular: Normal rate, regular rhythm and normal heart sounds.   Pulmonary/Chest: Effort normal and breath sounds normal. No respiratory distress. She has no wheezes. She has no rales.  Abdominal: Soft. There is no tenderness. There is no rebound, no guarding and no CVA tenderness.  Musculoskeletal: Normal range of motion.  No step-off noted with palpation of spine.   Neurological: She is alert. She has normal strength and normal reflexes. She displays no atrophy and no tremor. A sensory deficit (states decreased sensation lateral thighs, intact sharp sensation) is present. No cranial nerve deficit. She exhibits normal muscle tone. She displays a negative Romberg sign. She displays no seizure  activity. Coordination and gait normal. GCS eye subscore is 4. GCS verbal subscore is 5. GCS motor subscore is 6.  Normal proprioception. No hyperreflexia. No sensation deficits bilateral hands, feet, shins/calves.   Skin: Skin is warm and dry. No rash noted.  Psychiatric: She has a normal mood and affect.  Nursing note and vitals reviewed.   ED Course  Procedures (including critical care time) Labs Review Labs Reviewed  BASIC METABOLIC PANEL - Abnormal; Notable for the following:    Sodium 135 (*)    Glucose, Bld 274 (*)    All other components within normal limits  CBC WITH DIFFERENTIAL    Imaging Review No results found.   EKG Interpretation None      6:05 PM Patient seen and examined. Work-up initiated.   Vital signs reviewed and are as follows: BP 117/73 mmHg  Pulse 91  Temp(Src) 97.8 F (36.6 C) (Oral)  Resp 16  SpO2 95%  LMP 10/07/2014  8:21 PM Patient discussed previously with Dr. Effie ShyWentz.   No indication for imaging at this time. No red flag s/s of spinal cord compromise. No dermatomal distribution. No motor involvement or reflex problems.   At this point feel that patient is appropriate for outpatient workup.  She has PCP. Neuro referral given.   We discussed that if her symptoms progress, continue to spread, if she develops weakness, urinary retention, fecal incontinence, or other concerns she is to return to the emergency department for reevaluation.      MDM   Final diagnoses:  Paresthesias  Hyperglycemia   Patient with numb patches on her thighs only, no numbness distally. She denies any tight fitting clothing which could cause lateral cutaneous nerve issue. She does not have any red flag signs and symptoms of back pain. Her current back pain is chronic and unchanged. Currently no upper extremity symptoms. No headaches. No signs of meningitis. No head injury. No hyperreflexia or hyporeflexia. Do not suspect Guillain-Barr syndrome. Do not suspect  spinal cord compromise. Doubt MS but cannot entirely rule out. No visual symptoms. At this time, pt is safe for discharge to home. We discussed appropriate signs which should cause her to seek reevaluation including development of any type of weakness. Patient seems reliable to return.    Renne Crigler, PA-C 10/25/14 2026  Flint Melter, MD 10/25/14 [phone]

## 2014-10-25 NOTE — Discharge Instructions (Signed)
Please read and follow all provided instructions.  Your diagnoses today include:  1. Paresthesias   2. Hyperglycemia     Tests performed today include:  Vital signs - see below for your results today  Blood counts and electrolytes - high blood sugar  Medications prescribed:   None  Take any prescribed medications only as directed.  Home care instructions:   Follow any educational materials contained in this packet  Please rest, use ice or heat on your back for the next several days  Do not lift, push, pull anything more than 10 pounds for the next week  Follow-up instructions: Please follow-up with your primary care provider and the neurology referral listed in the next 1 week for further evaluation of your symptoms.   Return instructions:  SEEK IMMEDIATE MEDICAL ATTENTION IF YOU HAVE:  New numbness, tingling, weakness, or problem with the use of your arms or legs  Severe back pain not relieved with medications  Loss control of your bowels or bladder  Increasing pain in any areas of the body (such as chest or abdominal pain)  Shortness of breath, dizziness, or fainting.   Worsening nausea (feeling sick to your stomach), vomiting, fever, or sweats  Any other emergent concerns regarding your health   Additional Information:  Your vital signs today were: BP 115/66 mmHg   Pulse 70   Temp(Src) 97.8 F (36.6 C) (Oral)   Resp 16   SpO2 98%   LMP 10/07/2014 If your blood pressure (BP) was elevated above 135/85 this visit, please have this repeated by your doctor within one month. --------------

## 2014-10-25 NOTE — ED Notes (Addendum)
Pt reports numbness to bilateral hips radiating down only the outside of her legs. Sts she feels pressure but not the actual touch. Sts her legs are starting to feel heavy. Denies loss of bowel or bladder control. Started 3 days ago, gradually worsening. Has burning in legs when walking and feet feel tight like they are swelling but no noticeable swelling.

## 2023-05-15 ENCOUNTER — Other Ambulatory Visit: Payer: Self-pay

## 2023-05-15 ENCOUNTER — Emergency Department (HOSPITAL_BASED_OUTPATIENT_CLINIC_OR_DEPARTMENT_OTHER): Payer: Medicare HMO

## 2023-05-15 ENCOUNTER — Encounter (HOSPITAL_BASED_OUTPATIENT_CLINIC_OR_DEPARTMENT_OTHER): Payer: Self-pay | Admitting: Emergency Medicine

## 2023-05-15 ENCOUNTER — Observation Stay (HOSPITAL_BASED_OUTPATIENT_CLINIC_OR_DEPARTMENT_OTHER)
Admission: EM | Admit: 2023-05-15 | Discharge: 2023-05-16 | Discharge status: Against Medical Advice | Payer: Medicare HMO | Source: Home / Self Care | Attending: Emergency Medicine | Admitting: Emergency Medicine

## 2023-05-15 DIAGNOSIS — I10 Essential (primary) hypertension: Secondary | ICD-10-CM | POA: Insufficient documentation

## 2023-05-15 DIAGNOSIS — Z91148 Patient's other noncompliance with medication regimen for other reason: Secondary | ICD-10-CM | POA: Diagnosis not present

## 2023-05-15 DIAGNOSIS — Z91041 Radiographic dye allergy status: Secondary | ICD-10-CM | POA: Diagnosis not present

## 2023-05-15 DIAGNOSIS — I5021 Acute systolic (congestive) heart failure: Secondary | ICD-10-CM | POA: Diagnosis not present

## 2023-05-15 DIAGNOSIS — I214 Non-ST elevation (NSTEMI) myocardial infarction: Secondary | ICD-10-CM | POA: Diagnosis not present

## 2023-05-15 DIAGNOSIS — I25119 Atherosclerotic heart disease of native coronary artery with unspecified angina pectoris: Secondary | ICD-10-CM | POA: Insufficient documentation

## 2023-05-15 DIAGNOSIS — Z7984 Long term (current) use of oral hypoglycemic drugs: Secondary | ICD-10-CM | POA: Insufficient documentation

## 2023-05-15 DIAGNOSIS — Z881 Allergy status to other antibiotic agents status: Secondary | ICD-10-CM | POA: Diagnosis not present

## 2023-05-15 DIAGNOSIS — E785 Hyperlipidemia, unspecified: Secondary | ICD-10-CM

## 2023-05-15 DIAGNOSIS — T383X6A Underdosing of insulin and oral hypoglycemic [antidiabetic] drugs, initial encounter: Secondary | ICD-10-CM | POA: Diagnosis not present

## 2023-05-15 DIAGNOSIS — L03211 Cellulitis of face: Secondary | ICD-10-CM | POA: Diagnosis present

## 2023-05-15 DIAGNOSIS — Z880 Allergy status to penicillin: Secondary | ICD-10-CM | POA: Diagnosis not present

## 2023-05-15 DIAGNOSIS — R079 Chest pain, unspecified: Principal | ICD-10-CM | POA: Insufficient documentation

## 2023-05-15 DIAGNOSIS — Z79899 Other long term (current) drug therapy: Secondary | ICD-10-CM | POA: Insufficient documentation

## 2023-05-15 DIAGNOSIS — I2510 Atherosclerotic heart disease of native coronary artery without angina pectoris: Secondary | ICD-10-CM

## 2023-05-15 DIAGNOSIS — J45909 Unspecified asthma, uncomplicated: Secondary | ICD-10-CM | POA: Insufficient documentation

## 2023-05-15 DIAGNOSIS — I4892 Unspecified atrial flutter: Secondary | ICD-10-CM | POA: Insufficient documentation

## 2023-05-15 DIAGNOSIS — I209 Angina pectoris, unspecified: Secondary | ICD-10-CM

## 2023-05-15 DIAGNOSIS — Z91128 Patient's intentional underdosing of medication regimen for other reason: Secondary | ICD-10-CM | POA: Diagnosis not present

## 2023-05-15 DIAGNOSIS — Z9102 Food additives allergy status: Secondary | ICD-10-CM | POA: Diagnosis not present

## 2023-05-15 DIAGNOSIS — E119 Type 2 diabetes mellitus without complications: Secondary | ICD-10-CM | POA: Insufficient documentation

## 2023-05-15 DIAGNOSIS — E1165 Type 2 diabetes mellitus with hyperglycemia: Secondary | ICD-10-CM | POA: Diagnosis not present

## 2023-05-15 DIAGNOSIS — I110 Hypertensive heart disease with heart failure: Secondary | ICD-10-CM | POA: Diagnosis not present

## 2023-05-15 DIAGNOSIS — I4891 Unspecified atrial fibrillation: Secondary | ICD-10-CM | POA: Insufficient documentation

## 2023-05-15 DIAGNOSIS — E871 Hypo-osmolality and hyponatremia: Secondary | ICD-10-CM | POA: Insufficient documentation

## 2023-05-15 DIAGNOSIS — F17210 Nicotine dependence, cigarettes, uncomplicated: Secondary | ICD-10-CM | POA: Insufficient documentation

## 2023-05-15 DIAGNOSIS — Z888 Allergy status to other drugs, medicaments and biological substances status: Secondary | ICD-10-CM | POA: Diagnosis not present

## 2023-05-15 DIAGNOSIS — I252 Old myocardial infarction: Secondary | ICD-10-CM | POA: Diagnosis not present

## 2023-05-15 DIAGNOSIS — E08649 Diabetes mellitus due to underlying condition with hypoglycemia without coma: Secondary | ICD-10-CM

## 2023-05-15 DIAGNOSIS — E669 Obesity, unspecified: Secondary | ICD-10-CM | POA: Diagnosis not present

## 2023-05-15 DIAGNOSIS — Z66 Do not resuscitate: Secondary | ICD-10-CM | POA: Diagnosis not present

## 2023-05-15 DIAGNOSIS — Z720 Tobacco use: Secondary | ICD-10-CM

## 2023-05-15 HISTORY — DX: Hyperlipidemia, unspecified: E78.5

## 2023-05-15 LAB — RAPID URINE DRUG SCREEN, HOSP PERFORMED
Amphetamines: NOT DETECTED
Barbiturates: NOT DETECTED
Benzodiazepines: NOT DETECTED
Cocaine: NOT DETECTED
Opiates: NOT DETECTED
Tetrahydrocannabinol: POSITIVE — AB

## 2023-05-15 LAB — CBC
HCT: 45.2 % (ref 36.0–46.0)
Hemoglobin: 15.8 g/dL — ABNORMAL HIGH (ref 12.0–15.0)
MCH: 28.9 pg (ref 26.0–34.0)
MCHC: 35 g/dL (ref 30.0–36.0)
MCV: 82.6 fL (ref 80.0–100.0)
Platelets: 280 10*3/uL (ref 150–400)
RBC: 5.47 MIL/uL — ABNORMAL HIGH (ref 3.87–5.11)
RDW: 13.2 % (ref 11.5–15.5)
WBC: 11.7 10*3/uL — ABNORMAL HIGH (ref 4.0–10.5)
nRBC: 0 % (ref 0.0–0.2)

## 2023-05-15 LAB — BASIC METABOLIC PANEL
Anion gap: 13 (ref 5–15)
BUN: 9 mg/dL (ref 6–20)
CO2: 21 mmol/L — ABNORMAL LOW (ref 22–32)
Calcium: 9 mg/dL (ref 8.9–10.3)
Chloride: 96 mmol/L — ABNORMAL LOW (ref 98–111)
Creatinine, Ser: 0.81 mg/dL (ref 0.44–1.00)
GFR, Estimated: >60 mL/min (ref >60)
Glucose, Bld: 357 mg/dL — ABNORMAL HIGH (ref 70–99)
Potassium: 3.6 mmol/L (ref 3.5–5.1)
Sodium: 130 mmol/L — ABNORMAL LOW (ref 135–145)

## 2023-05-15 LAB — PREGNANCY, URINE: Preg Test, Ur: NEGATIVE

## 2023-05-15 LAB — TROPONIN I (HIGH SENSITIVITY): Troponin I (High Sensitivity): 16 ng/L (ref <18)

## 2023-05-15 MED ORDER — NITROGLYCERIN 0.4 MG SL SUBL
0.4000 mg | SUBLINGUAL_TABLET | SUBLINGUAL | Status: DC | PRN
  Administered 2023-05-15: 0.4 mg via SUBLINGUAL
  Filled 2023-05-15: qty 1

## 2023-05-15 MED ORDER — HEPARIN (PORCINE) 25000 UT/250ML-% IV SOLN
1350.0000 [IU]/h | INTRAVENOUS | Status: DC
  Administered 2023-05-15: 1200 [IU]/h via INTRAVENOUS
  Filled 2023-05-15: qty 250

## 2023-05-15 MED ORDER — ACETAMINOPHEN 325 MG PO TABS
650.0000 mg | ORAL_TABLET | Freq: Once | ORAL | Status: AC
  Administered 2023-05-15: 650 mg via ORAL
  Filled 2023-05-15: qty 2

## 2023-05-15 MED ORDER — HEPARIN BOLUS VIA INFUSION
4000.0000 [IU] | Freq: Once | INTRAVENOUS | Status: AC
  Administered 2023-05-15: 4000 [IU] via INTRAVENOUS

## 2023-05-15 MED ORDER — NITROGLYCERIN 2 % TD OINT
1.0000 [in_us] | TOPICAL_OINTMENT | Freq: Once | TRANSDERMAL | Status: AC
  Administered 2023-05-15: 1 [in_us] via TOPICAL
  Filled 2023-05-15: qty 1

## 2023-05-15 NOTE — Progress Notes (Signed)
ANTICOAGULATION CONSULT NOTE - Initial Consult  Pharmacy Consult for heparin Indication: atrial fibrillation  Allergies  Allergen Reactions   Contrast Media [Iodinated Contrast Media] Shortness Of Breath   Penicillins Hives   Tetanus Toxoids Other (See Comments)    Pain/knots in arms   Zithromax [Azithromycin] Nausea And Vomiting    Patient Measurements: Height: 5\' 3"  (160 cm) Weight: 90.7 kg (200 lb) IBW/kg (Calculated) : 52.4 Heparin Dosing Weight: 75kg  Vital Signs: Temp: 97.8 F (36.6 C) (07/08 2040) Temp Source: Oral (07/08 2040) BP: 129/84 (07/08 2145) Pulse Rate: 105 (07/08 2200)  Labs: Recent Labs    05/15/23 2058  HGB 15.8*  HCT 45.2  PLT 280  CREATININE 0.81  TROPONINIHS 16    Estimated Creatinine Clearance: 88.8 mL/min (by C-G formula based on SCr of 0.81 mg/dL).   Medical History: Past Medical History:  Diagnosis Date   Asthma    Diabetes mellitus without complication (HCC)    Hyperlipemia     Assessment: 52yo female c/o left-sided CP radiating to LUE and jaw associated w/ SOB, nausea, lightheadedness, and dizziness, reports that it feels similar to previous cardiac events, initial troponin negative but found to be in Afib w/ RVR >> to start heparin.  Goal of Therapy:  Heparin level 0.3-0.7 units/ml Monitor platelets by anticoagulation protocol: Yes   Plan:  Heparin 4000 units IV bolus x1 followed by infusion at 1200 units/hr. Monitor heparin levels and CBC.  Vernard Gambles, PharmD, BCPS  05/15/2023,10:54 PM

## 2023-05-15 NOTE — ED Notes (Signed)
ED TO INPATIENT HANDOFF REPORT  ED Nurse Name and Phone #: Dominga Ferry NR-Paramedic [phone]  S Name/Age/Gender Kathleen Marks 52 y.o. female Room/Bed: MHT14/MHT14  Code Status   Code Status: Not on file  Home/SNF/Other Home Patient oriented to: self, place, time, and situation Is this baseline? 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, collapsed 2 years ago and had a new stent placed, reports it "feels the same as when the stent collapsed", c/o left arm and left facial numbness, reports left sided CP w radiation to jaw, ShoB, nausea, lightheadedness, and dizziness, started a week ago    Allergies Allergies  Allergen Reactions   Contrast Media [Iodinated Contrast Media] Shortness Of Breath   Penicillins Hives   Tetanus Toxoids Other (See Comments)    Pain/knots in arms   Zithromax [Azithromycin] Nausea And Vomiting    Level of Care/Admitting Diagnosis ED Disposition     ED Disposition  Admit   Condition  --   Comment  Hospital Area: [facility] [[id number]]  Level of Care: Telemetry Cardiac [103]  Interfacility transfer: Yes  May place patient in observation at [facility] or [facility] if equivalent level of care is available:: No  Covid Evaluation: Asymptomatic - no recent exposure (last 10 days) testing not required  Diagnosis: New onset a-fib [facility]) [[id number]]  Admitting Physician: Magnus Ivan [[id number]]  Attending Physician: Magnus Ivan [[id number]]          B Medical/Surgery History Past Medical History:  Diagnosis Date   Asthma    Diabetes mellitus without complication (HCC)    Hyperlipemia    Past Surgical History:  Procedure Laterality Date   ARTHROSCOPIC REPAIR ACL     FEMUR SURGERY       A IV Location/Drains/Wounds Patient Lines/Drains/Airways Status     Active Line/Drains/Airways     Name  Placement date Placement time Site Days   Peripheral IV 05/15/23 18 G 1.16" Right Antecubital 05/15/23  2054  Antecubital  less than 1   Peripheral IV 05/15/23 20 G 1.16" Anterior;Left Forearm 05/15/23  2050  Forearm  less than 1            Intake/Output Last 24 hours No intake or output data in the 24 hours ending 05/15/23 2320  Labs/Imaging Results for orders placed or performed during the hospital encounter of 05/15/23 (from the past 48 hour(s))  Basic metabolic panel     Status: Abnormal   Collection Time: 05/15/23  8:58 PM  Result Value Ref Range   Sodium 130 (L) 135 - 145 mmol/L   Potassium 3.6 3.5 - 5.1 mmol/L   Chloride 96 (L) 98 - 111 mmol/L   CO2 21 (L) 22 - 32 mmol/L   Glucose, Bld 357 (H) 70 - 99 mg/dL    Comment: Glucose reference range applies only to samples taken after fasting for at least 8 hours.   BUN 9 6 - 20 mg/dL   Creatinine, Ser 5.28 0.44 - 1.00 mg/dL   Calcium 9.0 8.9 - 41.3 mg/dL   GFR, Estimated >24 >40 mL/min    Comment: (NOTE) Calculated using the CKD-EPI Creatinine Equation (2021)    Anion gap 13 5 - 15    Comment: Performed at [facility], [address] [place], [place] [address]  CBC     Status: Abnormal   Collection Time: 05/15/23  8:58 PM  Result Value Ref Range   WBC 11.7 (H) 4.0 - 10.5 K/uL   RBC 5.47 (H) 3.87 - 5.11 MIL/uL   Hemoglobin 15.8 (H) 12.0 - 15.0 g/dL   HCT 09.8 11.9 - 14.7 %   MCV 82.6 80.0 - 100.0 fL   MCH 28.9 26.0 - 34.0 pg   MCHC 35.0 30.0 - 36.0 g/dL   RDW 82.9 56.2 - 13.0 %   Platelets 280 150 - 400 K/uL   nRBC 0.0 0.0 - 0.2 %    Comment: Performed at [facility], [address] [place], [place] [address]  Troponin I (High Sensitivity)     Status: None   Collection Time: 05/15/23  8:58 PM  Result Value Ref Range   Troponin I (High Sensitivity) 16 <18 ng/L    Comment: (NOTE) Elevated high sensitivity troponin I (hsTnI) values and significant  changes across serial measurements may  suggest ACS but many other  chronic and acute conditions are known to elevate hsTnI results.  Refer to the "Links" section for chest pain algorithms and additional  guidance. Performed at [facility], [address] [place], [place] [address]   Pregnancy, urine     Status: None   Collection Time: 05/15/23  9:57 PM  Result Value Ref Range   Preg Test, Ur NEGATIVE NEGATIVE    Comment:        THE SENSITIVITY OF THIS METHODOLOGY IS >25 mIU/mL. Performed at [facility], [address], [place], [place] [address]    DG Chest [place] 1 View  Result Date: 05/15/2023 CLINICAL DATA:  Shortness of breath. EXAM: PORTABLE CHEST 1 VIEW COMPARISON:  March 01, 2019 FINDINGS: The heart size and mediastinal contours are within normal limits. A coronary artery stent is noted. Both lungs are clear. The visualized skeletal structures are unremarkable. IMPRESSION: No active disease. Electronically Signed   By: Aram Candela M.D.   On: 05/15/2023 21:13    Pending Labs Unresulted Labs (From admission, onward)     Start     Ordered   05/15/23 2313  Urine rapid drug screen (hosp performed)  Once,   STAT        05/15/23 2312            Vitals/Pain Today's Vitals   05/15/23 2230 05/15/23 2245 05/15/23 2300 05/15/23 2315  BP: 103/73 107/77 117/70 114/74  Pulse: (!) 110 (!) 102 (!) 102 100  Resp: 17 17 (!) 21 20  Temp:      TempSrc:      SpO2: 98% 98% 98% 99%  Weight:      Height:      PainSc:        Isolation Precautions No active isolations  Medications Medications  nitroGLYCERIN (NITROSTAT) SL tablet 0.4 mg (0.4 mg Sublingual Given 05/15/23 2218)  heparin ADULT infusion 100 units/mL (25000 units/221mL) (1,200 Units/hr Intravenous New Bag/Given 05/15/23 2309)  nitroGLYCERIN (NITROGLYN) 2 % ointment 1 inch (1 inch Topical Given 05/15/23 2218)  heparin bolus via infusion 4,000 Units (4,000 Units Intravenous Bolus from Bag 05/15/23 2309)    Mobility walks     Focused  Assessments Cardiac Assessment Handoff:  Cardiac Rhythm: Sinus tachycardia No results found for: "CKTOTAL", "CKMB", "CKMBINDEX", "TROPONINI" No results found for: "DDIMER" Does the Patient currently have chest pain? Yes    R Recommendations: See Admitting Provider Note  Report given to:   Additional Notes:

## 2023-05-15 NOTE — ED Notes (Signed)
Called [facility] for transport and spoke with Marijean Niemann

## 2023-05-15 NOTE — ED Provider Notes (Signed)
[facility] Provider Note   CSN: [id number] Arrival date & time: 05/15/23  2026     History  Chief Complaint  Patient presents with   Chest Pain    [place] Screen is a 52 y.o. female.  HPI     52 year old female comes in with chief complaint of chest pain. Patient has history of CAD, hypertension, diabetes, dyslipidemia.  Patient has history of STEMI and subsequently has had failed stent.  She continues to smoke 1 pack a day, and used to use cocaine which she stopped 2 years back.  Patient comes in with chief complaint of chest discomfort that has been present for the last week.  Chest pain is intermittent, unprovoked.  She has noted associated left arm, left neck pain and on occasion has had tingling sensation in her extremity as well.  Her symptoms are described as squeezing pain, similar to her previous failed stent pain. Patient also admits to associated shortness of breath, nausea and diaphoresis.  When she arrived to the ER, she was noted to have heart rate in the 170s.  EKG shows A-fib.  Patient denies any history of A-fib.  States that she has not experienced any palpitations recently.  On exam, patient noted to have unequal radial pulse, right side is palpable, left side is 2+.  Patient denies any right upper extremity pain or skin discoloration.  Home Medications Prior to Admission medications   Medication Sig Start Date End Date Taking? Authorizing Provider  acetaminophen (TYLENOL) 500 MG tablet Take 500-1,000 mg by mouth every 6 (six) hours as needed.    [provider]  albuterol (PROVENTIL HFA;VENTOLIN HFA) 108 (90 BASE) MCG/ACT inhaler Inhale 2 puffs into the lungs every 4 (four) hours as needed for wheezing or shortness of breath. 05/21/14   Piepenbrink, Victorino Dike, PA-C  diphenhydrAMINE (BENADRYL) 25 MG tablet Take 25 mg by mouth every 6 (six) hours as needed for itching.    [provider]   guaiFENesin (MUCINEX) 600 MG 12 hr tablet Take 600 mg by mouth 2 (two) times daily as needed (cold symptoms). For cold symptoms    [provider]  guaifenesin (ROBITUSSIN) 100 MG/5ML syrup Take 200 mg by mouth 3 (three) times daily as needed for cough.    [provider]  guaiFENesin-codeine 100-10 MG/5ML syrup Take 10 mLs by mouth every 6 (six) hours as needed for cough. 05/21/14   Piepenbrink, Victorino Dike, PA-C  ibuprofen (ADVIL,MOTRIN) 200 MG tablet Take 800-1,200 mg by mouth 2 (two) times daily as needed for moderate pain (pain).     [provider]  metFORMIN (GLUCOPHAGE) 500 MG tablet Take 500 mg by mouth 2 (two) times daily with a meal.    [provider]  predniSONE (DELTASONE) 20 MG tablet Take 2 tablets (40 mg total) by mouth daily. Patient not taking: Reported on 10/25/2014 05/21/14   Piepenbrink, Victorino Dike, PA-C  pseudoephedrine-guaifenesin (TUSSIN PE) 30-100 MG/5ML SYRP Take 5 mLs by mouth every 4 (four) hours as needed (cough).    [provider]      Allergies    Contrast media [iodinated contrast media], Penicillins, Tetanus toxoids, and Zithromax [azithromycin]    Review of Systems   Review of Systems  All other systems reviewed and are negative.   Physical Exam Updated Vital Signs BP 114/74   Pulse 100   Temp 97.8 F (36.6 C) (Oral)   Resp 20   Ht 5\' 3"  (1.6 m)   Wt  90.7 kg   LMP 05/08/2023 (Approximate)   SpO2 99%   BMI 35.43 kg/m  Physical Exam Vitals and nursing note reviewed.  Constitutional:      Appearance: She is well-developed.  HENT:     Head: Atraumatic.  Cardiovascular:     Rate and Rhythm: Tachycardia present.     Pulses:          Radial pulses are 1+ on the right side and 2+ on the left side.       Dorsalis pedis pulses are 2+ on the right side and 2+ on the left side.       Posterior tibial pulses are 2+ on the right side and 2+ on the left side.     Heart sounds: Normal heart sounds.  Pulmonary:      Effort: Pulmonary effort is normal.     Breath sounds: Normal breath sounds.  Musculoskeletal:     Cervical back: Neck supple.  Skin:    General: Skin is warm and dry.  Neurological:     Mental Status: She is alert and oriented to person, place, and time.     ED Results / Procedures / Treatments   Labs (all labs ordered are listed, but only abnormal results are displayed) Labs Reviewed  BASIC METABOLIC PANEL - Abnormal; Notable for the following components:      Result Value   Sodium 130 (*)    Chloride 96 (*)    CO2 21 (*)    Glucose, Bld 357 (*)    All other components within normal limits  CBC - Abnormal; Notable for the following components:   WBC 11.7 (*)    RBC 5.47 (*)    Hemoglobin 15.8 (*)    All other components within normal limits  PREGNANCY, URINE  RAPID URINE DRUG SCREEN, HOSP PERFORMED  TROPONIN I (HIGH SENSITIVITY)  TROPONIN I (HIGH SENSITIVITY)    EKG EKG Interpretation Date/Time:  Monday May 15 2023 20:44:53 EDT Ventricular Rate:  184 PR Interval:    QRS Duration:  79 QT Interval:  278 QTC Calculation: 487 R Axis:   6  Text Interpretation: Atrial fibrillation with rapid V-rate Ventricular tachycardia, unsustained Inferior infarct, old No old tracing to compare Confirmed by Derwood Kaplan [id number]) on 05/15/2023 8:52:43 PM  ED ECG REPORT   Date: 05/15/2023  Rate: 105  Rhythm: sinus tachycardia  QRS Axis: normal  Intervals: normal  ST/T Wave abnormalities: nonspecific ST changes  Conduction Disutrbances:none  Narrative Interpretation:   Old EKG Reviewed: changes noted  I have personally reviewed the EKG tracing and agree with the computerized printout as noted.    Radiology DG Chest Port 1 View  Result Date: 05/15/2023 CLINICAL DATA:  Shortness of breath. EXAM: PORTABLE CHEST 1 VIEW COMPARISON:  March 01, 2019 FINDINGS: The heart size and mediastinal contours are within normal limits. A coronary artery stent is noted. Both lungs are clear.  The visualized skeletal structures are unremarkable. IMPRESSION: No active disease. Electronically Signed   By: Aram Candela M.D.   On: 05/15/2023 21:13    Procedures Procedures    Medications Ordered in ED Medications  nitroGLYCERIN (NITROSTAT) SL tablet 0.4 mg (0.4 mg Sublingual Given 05/15/23 2218)  heparin ADULT infusion 100 units/mL (25000 units/256mL) (1,200 Units/hr Intravenous New Bag/Given 05/15/23 2309)  nitroGLYCERIN (NITROGLYN) 2 % ointment 1 inch (1 inch Topical Given 05/15/23 2218)  heparin bolus via infusion 4,000 Units (4,000 Units Intravenous Bolus from Bag 05/15/23 2309)    ED Course/  Medical Decision Making/ A&P                             Medical Decision Making Amount and/or Complexity of Data Reviewed Labs: ordered. Radiology: ordered.  Risk Prescription drug management. Decision regarding hospitalization.   This patient presents to the ED with chief complaint(s) of chest pain with pertinent past medical history of CAD, diabetes, tobacco use disorder, cocaine use disorder .The complaint involves an extensive differential diagnosis and also carries with it a high risk of complications and morbidity.    Patient arrived with tachycardia and what appears to be new onset A-fib.  Subsequently she converted to sinus tachycardia. She does have nonspecific ST elevations. She also has unequal radial pulse, left is better than right, but she has no evidence of critical limb ischemia and denies any back pain and patient is not hypertensive.  Additionally she has severe allergic reaction to contrast.  The differential diagnosis includes :  ACS syndrome Demand ischemia secondary to A-fib Symptomatic A-fib Aortic dissection CHF exacerbation Valvular disorder Myocarditis Pericarditis Endocarditis Pericardial effusion / tamponade Pneumonia Pleural effusion / Pulmonary edema PE Pneumothorax Musculoskeletal pain PUD / Gastritis / Esophagitis Esophageal  spasm  Overall, lower suspicion for dissection. Patient might have thrombosis of her right upper extremity, but she has no critical limb ischemia and she has severe allergic reaction to contrast, therefore we will defer the management of that to admitting service or ultimately vascular surgery.  This finding has been discussed with the patient as well.  The initial plan is to get basic labs, chest x-ray.  Additional history obtained: Records reviewed Care Everywhere/External Records  Independent labs interpretation:  The following labs were independently interpreted: Patient's initial high sensitive troponin and basic labs are normal.  Creatinine is also normal.  Independent visualization and interpretation of imaging: - I independently visualized the following imaging with scope of interpretation limited to determining acute life threatening conditions related to emergency care: X-ray of the chest, which revealed no evidence of pneumothorax  Treatment and Reassessment: On reassessment, patient's symptoms continue to be mild in terms of chest pain. She remains in sinus tachycardia.  Consultation: - Consulted or discussed management/test interpretation with external professional: Cardiology team.  Cardiology fellow indicated that they will see the patient.   Final Clinical Impression(s) / ED Diagnoses Final diagnoses:  Atrial fib/flutter, transient (HCC)  Angina pectoris [facility])    Rx / DC Orders ED Discharge Orders     None         Derwood Kaplan, MD 05/15/23 2321

## 2023-05-15 NOTE — ED Notes (Addendum)
Patient states she feels like her stent is collapsing and her left arm is hurting. She feels like something is squeezing her heart, which is how her last STEMI felt. EDP made aware.

## 2023-05-15 NOTE — ED Notes (Signed)
Patient stated she was having a difficult time breathing. O2 sats registering at 94% on room air. Patient states she feels like its an asthma attack without the asthma. Patient placed on 2lpm via [place].

## 2023-05-15 NOTE — ED Triage Notes (Signed)
Pt reports hx of MI w stent placement in 2019, collapsed 2 years ago and had a new stent placed, reports it "feels the same as when the stent collapsed", c/o left arm and left facial numbness, reports left sided CP w radiation to jaw, ShoB, nausea, lightheadedness, and dizziness, started a week ago

## 2023-05-15 NOTE — ED Notes (Signed)
Patient transported to X-ray 

## 2023-05-16 ENCOUNTER — Other Ambulatory Visit: Payer: Self-pay

## 2023-05-16 ENCOUNTER — Observation Stay (HOSPITAL_BASED_OUTPATIENT_CLINIC_OR_DEPARTMENT_OTHER): Payer: Medicare HMO

## 2023-05-16 ENCOUNTER — Encounter (HOSPITAL_COMMUNITY): Payer: Self-pay | Admitting: Internal Medicine

## 2023-05-16 ENCOUNTER — Other Ambulatory Visit (HOSPITAL_COMMUNITY): Payer: Self-pay

## 2023-05-16 DIAGNOSIS — I4891 Unspecified atrial fibrillation: Secondary | ICD-10-CM | POA: Diagnosis not present

## 2023-05-16 DIAGNOSIS — Z91148 Patient's other noncompliance with medication regimen for other reason: Secondary | ICD-10-CM

## 2023-05-16 DIAGNOSIS — E08649 Diabetes mellitus due to underlying condition with hypoglycemia without coma: Secondary | ICD-10-CM

## 2023-05-16 DIAGNOSIS — I498 Other specified cardiac arrhythmias: Secondary | ICD-10-CM

## 2023-05-16 DIAGNOSIS — E782 Mixed hyperlipidemia: Secondary | ICD-10-CM

## 2023-05-16 DIAGNOSIS — Z720 Tobacco use: Secondary | ICD-10-CM | POA: Diagnosis not present

## 2023-05-16 DIAGNOSIS — I214 Non-ST elevation (NSTEMI) myocardial infarction: Secondary | ICD-10-CM

## 2023-05-16 DIAGNOSIS — I10 Essential (primary) hypertension: Secondary | ICD-10-CM | POA: Diagnosis not present

## 2023-05-16 DIAGNOSIS — I2510 Atherosclerotic heart disease of native coronary artery without angina pectoris: Secondary | ICD-10-CM | POA: Diagnosis not present

## 2023-05-16 DIAGNOSIS — E119 Type 2 diabetes mellitus without complications: Secondary | ICD-10-CM

## 2023-05-16 DIAGNOSIS — Z9861 Coronary angioplasty status: Secondary | ICD-10-CM

## 2023-05-16 DIAGNOSIS — I259 Chronic ischemic heart disease, unspecified: Secondary | ICD-10-CM | POA: Diagnosis not present

## 2023-05-16 DIAGNOSIS — R079 Chest pain, unspecified: Secondary | ICD-10-CM | POA: Diagnosis not present

## 2023-05-16 DIAGNOSIS — E785 Hyperlipidemia, unspecified: Secondary | ICD-10-CM

## 2023-05-16 DIAGNOSIS — E871 Hypo-osmolality and hyponatremia: Secondary | ICD-10-CM

## 2023-05-16 DIAGNOSIS — J45909 Unspecified asthma, uncomplicated: Secondary | ICD-10-CM

## 2023-05-16 LAB — CBC
HCT: 44.3 % (ref 36.0–46.0)
Hemoglobin: 15.2 g/dL — ABNORMAL HIGH (ref 12.0–15.0)
MCH: 28.7 pg (ref 26.0–34.0)
MCHC: 34.3 g/dL (ref 30.0–36.0)
MCV: 83.7 fL (ref 80.0–100.0)
Platelets: 255 10*3/uL (ref 150–400)
RBC: 5.29 MIL/uL — ABNORMAL HIGH (ref 3.87–5.11)
RDW: 13.4 % (ref 11.5–15.5)
WBC: 13.4 10*3/uL — ABNORMAL HIGH (ref 4.0–10.5)
nRBC: 0 % (ref 0.0–0.2)

## 2023-05-16 LAB — BASIC METABOLIC PANEL
Anion gap: 11 (ref 5–15)
BUN: 11 mg/dL (ref 6–20)
CO2: 20 mmol/L — ABNORMAL LOW (ref 22–32)
Calcium: 8.6 mg/dL — ABNORMAL LOW (ref 8.9–10.3)
Chloride: 101 mmol/L (ref 98–111)
Creatinine, Ser: 0.78 mg/dL (ref 0.44–1.00)
GFR, Estimated: >60 mL/min (ref >60)
Glucose, Bld: 485 mg/dL — ABNORMAL HIGH (ref 70–99)
Potassium: 4.7 mmol/L (ref 3.5–5.1)
Sodium: 132 mmol/L — ABNORMAL LOW (ref 135–145)

## 2023-05-16 LAB — HIV ANTIBODY (ROUTINE TESTING W REFLEX): HIV Screen 4th Generation wRfx: NONREACTIVE

## 2023-05-16 LAB — ECHOCARDIOGRAM COMPLETE
AR max vel: 2.19 cm2
AV Area VTI: 2.28 cm2
AV Area mean vel: 2.09 cm2
AV Mean grad: 4 mmHg
AV Peak grad: 6 mmHg
Ao pk vel: 1.22 m/s
Area-P 1/2: 5.42 cm2
Height: 63 in
S' Lateral: 3.6 cm
Weight: 3156.99 oz

## 2023-05-16 LAB — LIPID PANEL
Cholesterol: 273 mg/dL — ABNORMAL HIGH (ref 0–200)
HDL: 32 mg/dL — ABNORMAL LOW (ref >40)
LDL Cholesterol: UNDETERMINED mg/dL (ref 0–99)
Total CHOL/HDL Ratio: 8.5 RATIO
Triglycerides: 645 mg/dL — ABNORMAL HIGH (ref <150)
VLDL: UNDETERMINED mg/dL (ref 0–40)

## 2023-05-16 LAB — LDL CHOLESTEROL, DIRECT: Direct LDL: 165 mg/dL — ABNORMAL HIGH (ref 0–99)

## 2023-05-16 LAB — TROPONIN I (HIGH SENSITIVITY)
Troponin I (High Sensitivity): 80 ng/L — ABNORMAL HIGH (ref <18)
Troponin I (High Sensitivity): 766 ng/L (ref <18)

## 2023-05-16 LAB — HEPARIN LEVEL (UNFRACTIONATED): Heparin Unfractionated: 0.21 IU/mL — ABNORMAL LOW (ref 0.30–0.70)

## 2023-05-16 LAB — GLUCOSE, CAPILLARY
Glucose-Capillary: 434 mg/dL — ABNORMAL HIGH (ref 70–99)
Glucose-Capillary: 512 mg/dL (ref 70–99)

## 2023-05-16 LAB — TSH: TSH: 0.774 u[IU]/mL (ref 0.350–4.500)

## 2023-05-16 LAB — MRSA NEXT GEN BY PCR, NASAL: MRSA by PCR Next Gen: DETECTED — AB

## 2023-05-16 MED ORDER — ROSUVASTATIN CALCIUM 20 MG PO TABS
40.0000 mg | ORAL_TABLET | Freq: Every day | ORAL | Status: DC
  Administered 2023-05-16: 40 mg via ORAL
  Filled 2023-05-16: qty 2

## 2023-05-16 MED ORDER — PREDNISONE 20 MG PO TABS
50.0000 mg | ORAL_TABLET | Freq: Four times a day (QID) | ORAL | Status: DC
  Administered 2023-05-16 (×2): 50 mg via ORAL
  Filled 2023-05-16 (×2): qty 1

## 2023-05-16 MED ORDER — DIPHENHYDRAMINE HCL 25 MG PO CAPS: 50.0000 mg | ORAL_CAPSULE | Freq: Once | ORAL | Status: DC

## 2023-05-16 MED ORDER — METOPROLOL TARTRATE 25 MG PO TABS
25.0000 mg | ORAL_TABLET | Freq: Two times a day (BID) | ORAL | Status: DC
  Administered 2023-05-16: 25 mg via ORAL
  Filled 2023-05-16: qty 1

## 2023-05-16 MED ORDER — DIPHENHYDRAMINE HCL 50 MG/ML IJ SOLN: 50.0000 mg | Freq: Once | INTRAMUSCULAR | Status: DC

## 2023-05-16 MED ORDER — LEVALBUTEROL HCL 0.63 MG/3ML IN NEBU: 0.6300 mg | INHALATION_SOLUTION | Freq: Four times a day (QID) | RESPIRATORY_TRACT | Status: DC | PRN

## 2023-05-16 MED ORDER — SODIUM CHLORIDE 0.9 % IV SOLN: INTRAVENOUS | Status: DC

## 2023-05-16 MED ORDER — ACETAMINOPHEN 325 MG PO TABS: 650.0000 mg | ORAL_TABLET | Freq: Four times a day (QID) | ORAL | Status: DC | PRN

## 2023-05-16 MED ORDER — ASPIRIN 81 MG PO TBEC
81.0000 mg | DELAYED_RELEASE_TABLET | Freq: Every day | ORAL | Status: DC
  Filled 2023-05-16: qty 1

## 2023-05-16 MED ORDER — INSULIN ASPART 100 UNIT/ML IJ SOLN: 0.0000 [IU] | INTRAMUSCULAR | Status: DC

## 2023-05-16 MED ORDER — CHLORHEXIDINE GLUCONATE CLOTH 2 % EX PADS
6.0000 | MEDICATED_PAD | Freq: Every day | CUTANEOUS | Status: DC
  Administered 2023-05-16: 6 via TOPICAL

## 2023-05-16 MED ORDER — HEPARIN BOLUS VIA INFUSION
1000.0000 [IU] | Freq: Once | INTRAVENOUS | Status: AC
  Administered 2023-05-16: 1000 [IU] via INTRAVENOUS
  Filled 2023-05-16: qty 1000

## 2023-05-16 MED ORDER — ASPIRIN 81 MG PO CHEW
CHEWABLE_TABLET | ORAL | Status: AC
  Filled 2023-05-16: qty 4

## 2023-05-16 MED ORDER — MUPIROCIN 2 % EX OINT
1.0000 | TOPICAL_OINTMENT | Freq: Two times a day (BID) | CUTANEOUS | Status: DC
  Administered 2023-05-16 (×2): 1 via NASAL
  Filled 2023-05-16: qty 22

## 2023-05-16 MED ORDER — ACETAMINOPHEN 650 MG RE SUPP: 650.0000 mg | Freq: Four times a day (QID) | RECTAL | Status: DC | PRN

## 2023-05-16 MED ORDER — ASPIRIN 81 MG PO CHEW
324.0000 mg | CHEWABLE_TABLET | Freq: Once | ORAL | Status: AC
  Administered 2023-05-16: 324 mg via ORAL

## 2023-05-16 MED ORDER — PERFLUTREN LIPID MICROSPHERE
1.0000 mL | INTRAVENOUS | Status: AC | PRN
  Administered 2023-05-16: 2 mL via INTRAVENOUS

## 2023-05-16 MED ORDER — NICOTINE 14 MG/24HR TD PT24
14.0000 mg | MEDICATED_PATCH | Freq: Every day | TRANSDERMAL | Status: DC
  Filled 2023-05-16 (×2): qty 1

## 2023-05-16 NOTE — Progress Notes (Signed)
ANTICOAGULATION CONSULT NOTE - Follow Up Consult  Pharmacy Consult for heparin Indication: atrial fibrillation  Allergies  Allergen Reactions   Contrast Media [Iodinated Contrast Media] Shortness Of Breath   Penicillins Hives   Tetanus Toxoids Other (See Comments)    Pain/knots in arms   Zithromax [Azithromycin] Nausea And Vomiting    Patient Measurements: Height: 5\' 3"  (160 cm) Weight: 89.5 kg (197 lb 5 oz) IBW/kg (Calculated) : 52.4 Heparin Dosing Weight: 72.7 kg   Vital Signs: Temp: 97.9 F (36.6 C) (07/09 0407) Temp Source: Oral (07/09 0407) BP: 114/77 (07/09 0407) Pulse Rate: 101 (07/09 0407)  Labs: Recent Labs    05/15/23 2058 05/15/23 2313 05/16/23 0622  HGB 15.8*  --  15.2*  HCT 45.2  --  44.3  PLT 280  --  255  HEPARINUNFRC  --   --  0.21*  CREATININE 0.81  --  0.78  TROPONINIHS 16 80*  --     Estimated Creatinine Clearance: 89.3 mL/min (by C-G formula based on SCr of 0.78 mg/dL).   Medications:  Scheduled:   aspirin EC  81 mg Oral Daily   Chlorhexidine Gluconate Cloth  6 each Topical Q0600   diphenhydrAMINE  50 mg Oral Once   Or   diphenhydrAMINE  50 mg Intravenous Once   insulin aspart  0-9 Units Subcutaneous Q4H   metoprolol tartrate  25 mg Oral BID   mupirocin ointment  1 Application Nasal BID   nicotine  14 mg Transdermal Daily   predniSONE  50 mg Oral Q6H   rosuvastatin  40 mg Oral Daily   Infusions:   sodium chloride 125 mL/hr at 05/16/23 0521   heparin 1,200 Units/hr (05/16/23 0521)    Assessment: 52 yof c/o left-sided CP radiating to LUE and jaw associated w/ SOB, nausea, lightheadedness, and dizziness. Patient reports that it feels similar to previous cardiac events. Initial troponin negative, but did peak at 80. Was found to be in Afib with RVR, which is a new diagnosis for this patient. Pharmacy consulted to dose heparin for Afib.  Heparin level was subtherapeutic at 0.21. Hgb and platelets stable. No bleeding noted.   Goal of  Therapy:  Heparin level 0.3-0.7 units/ml Monitor platelets by anticoagulation protocol: Yes  Plan:  Give 1000 units bolus x 1 Increase heparin infusion to 1350 units/hr Obtain heparin level at 1500. Follow-up plans for cath lab procedure. Continue to monitor H&H and platelets  Lennie Muckle, PharmD PGY1 Acute Care Resident 05/16/2023 7:59 AM

## 2023-05-16 NOTE — Progress Notes (Signed)
DAILY PROGRESS NOTE   Patient Name: Kathleen Marks Date of Encounter: 05/16/2023 Cardiologist: None  Chief Complaint   No chest pain  Patient Profile   Kathleen Marks is a 52 y.o. female with a hx of CAD s/p PCI (OM PCI 2019 in the setting of NSTEMI, re-stented due to ISR in 2021), HTN, HLD, DM, obesity active smoker and marihuana use who is being seen 05/16/2023 for the evaluation of chest pain at the request of IM.   Subjective   Was reported to be in afib with RVR, but now sinus rhythm. I personally reviewed her admission EKG -this appears to be a sinus tachycardia or possible atrial tachycardia, but is very regular and unlikely to be afib. Troponin bumped to 80 - I have ordered additional troponins. Poorly controlled diabetes with glucose of 512 mg/dL. She refuses insulin and is paranoid about medications. Lipids are abysmal with TG 645, TC 373, HDL 32. Direct LDL is pending.  Echo performed today shows a significant LAD territory wall motion abnormality - LVEF 30-35% - there is sluggish flow at the apex, but no thrombus.  This is new compared to an echo at [facility] in 2021, which showed LVEF 53%. She had a myoview in 02/2022 which showed inferior wall attenuation and normal LVEF 64%.     Objective   Vitals:   05/15/23 2315 05/16/23 0023 05/16/23 0407 05/16/23 0827  BP: 114/74 117/76 114/77 119/88  Pulse: 100 94 (!) 101 88  Resp: 20 20 20 14   Temp:  98.5 F (36.9 C) 97.9 F (36.6 C) 98 F (36.7 C)  TempSrc:  Oral Oral Oral  SpO2: 99%  98% 94%  Weight:  89.5 kg    Height:  5\' 3"  (1.6 m)      Intake/Output Summary (Last 24 hours) at 05/16/2023 0944 Last data filed at 05/16/2023 0831 Gross per 24 hour  Intake 492.46 ml  Output 1200 ml  Net -707.54 ml   Filed Weights   05/15/23 2042 05/16/23 0023  Weight: 90.7 kg 89.5 kg    Physical Exam   General appearance: alert, no distress, and moderately obese Neck: no carotid bruit, no JVD, and thyroid not enlarged,  symmetric, no tenderness/mass/nodules Lungs: clear to auscultation bilaterally Heart: regular rate and rhythm Abdomen: soft, non-tender; bowel sounds normal; no masses,  no organomegaly Extremities: extremities normal, atraumatic, no cyanosis or edema Pulses: 2+ and symmetric Skin: Skin color, texture, turgor normal. No rashes or lesions Neurologic: Mental status: Awake and oriented Psych: Tearful, paranoid, delusional  Inpatient Medications    Scheduled Meds:  aspirin EC  81 mg Oral Daily   Chlorhexidine Gluconate Cloth  6 each Topical Q0600   diphenhydrAMINE  50 mg Oral Once   Or   diphenhydrAMINE  50 mg Intravenous Once   insulin aspart  0-9 Units Subcutaneous Q4H   metoprolol tartrate  25 mg Oral BID   mupirocin ointment  1 Application Nasal BID   nicotine  14 mg Transdermal Daily   predniSONE  50 mg Oral Q6H   rosuvastatin  40 mg Oral Daily    Continuous Infusions:  sodium chloride 125 mL/hr at 05/16/23 0521   heparin 1,350 Units/hr (05/16/23 0808)    PRN Meds: acetaminophen **OR** acetaminophen, levalbuterol, nitroGLYCERIN, perflutren lipid microspheres (DEFINITY) IV suspension   Labs   Results for orders placed or performed during the hospital encounter of 05/15/23 (from the past 48 hour(s))  Basic metabolic panel     Status: Abnormal  Collection Time: 05/15/23  8:58 PM  Result Value Ref Range   Sodium 130 (L) 135 - 145 mmol/L   Potassium 3.6 3.5 - 5.1 mmol/L   Chloride 96 (L) 98 - 111 mmol/L   CO2 21 (L) 22 - 32 mmol/L   Glucose, Bld 357 (H) 70 - 99 mg/dL    Comment: Glucose reference range applies only to samples taken after fasting for at least 8 hours.   BUN 9 6 - 20 mg/dL   Creatinine, Ser 1.61 0.44 - 1.00 mg/dL   Calcium 9.0 8.9 - 09.6 mg/dL   GFR, Estimated >04 >54 mL/min    Comment: (NOTE) Calculated using the CKD-EPI Creatinine Equation (2021)    Anion gap 13 5 - 15    Comment: Performed at [facility], [address] [place], [place] [address]  CBC     Status: Abnormal   Collection Time: 05/15/23  8:58 PM  Result Value Ref Range   WBC 11.7 (H) 4.0 - 10.5 K/uL   RBC 5.47 (H) 3.87 - 5.11 MIL/uL   Hemoglobin 15.8 (H) 12.0 - 15.0 g/dL   HCT 91.4 78.2 - 95.6 %   MCV 82.6 80.0 - 100.0 fL   MCH 28.9 26.0 - 34.0 pg   MCHC 35.0 30.0 - 36.0 g/dL   RDW 21.3 08.6 - 57.8 %   Platelets 280 150 - 400 K/uL   nRBC 0.0 0.0 - 0.2 %    Comment: Performed at [facility], [address] [place], [place] [address]  Troponin I (High Sensitivity)     Status: None   Collection Time: 05/15/23  8:58 PM  Result Value Ref Range   Troponin I (High Sensitivity) 16 <18 ng/L    Comment: (NOTE) Elevated high sensitivity troponin I (hsTnI) values and significant  changes across serial measurements may suggest ACS but many other  chronic and acute conditions are known to elevate hsTnI results.  Refer to the "Links" section for chest pain algorithms and additional  guidance. Performed at [facility], [address] [place], [place] [address]   Pregnancy, urine     Status: None   Collection Time: 05/15/23  9:57 PM  Result Value Ref Range   Preg Test, Ur NEGATIVE NEGATIVE    Comment:        THE SENSITIVITY OF THIS METHODOLOGY IS >25 mIU/mL. Performed at [facility], [address] [place], [place] [address]   Troponin I (High Sensitivity)     Status: Abnormal   Collection Time: 05/15/23 11:13 PM  Result Value Ref Range   Troponin I (High Sensitivity) 80 (H) <18 ng/L    Comment: DELTA CHECK NOTED READ BACK AND VERIFIED WITH EMILEE SHAFFER RN AT 0011 ON 05/16/23 BY [email or web address] (NOTE) Elevated high sensitivity troponin I (hsTnI) values and significant  changes across serial measurements may suggest ACS but many other  chronic and acute conditions are known to elevate hsTnI results.  Refer to the "Links" section for chest pain algorithms and additional  guidance. Performed at [facility],  [address] [place], [place] [address]   Urine rapid drug screen (hosp performed)     Status: Abnormal   Collection Time: 05/15/23 11:15 PM  Result Value Ref Range   Opiates NONE DETECTED NONE DETECTED   Cocaine NONE DETECTED NONE DETECTED   Benzodiazepines NONE DETECTED NONE DETECTED   Amphetamines NONE DETECTED NONE DETECTED  Tetrahydrocannabinol POSITIVE (A) NONE DETECTED   Barbiturates NONE DETECTED NONE DETECTED    Comment: (NOTE) DRUG SCREEN FOR MEDICAL PURPOSES ONLY.  IF CONFIRMATION IS NEEDED FOR ANY PURPOSE, NOTIFY LAB WITHIN 5 DAYS.  LOWEST DETECTABLE LIMITS FOR URINE DRUG SCREEN Drug Class                     Cutoff (ng/mL) Amphetamine and metabolites    1000 Barbiturate and metabolites    200 Benzodiazepine                 200 Opiates and metabolites        300 Cocaine and metabolites        300 THC                            50 Performed at [facility], [address] [place], [place] [address]   MRSA Next Gen by PCR, Nasal     Status: Abnormal   Collection Time: 05/16/23 12:28 AM   Specimen: Nasal Mucosa; Nasal Swab  Result Value Ref Range   MRSA by PCR Next Gen DETECTED (A) NOT DETECTED    Comment: RESULT CALLED TO, READ BACK BY AND VERIFIED WITH: A COOPER,RN@[id number]  05/16/23 MK (NOTE) The GeneXpert MRSA Assay (FDA approved for NASAL specimens only), is one component of a comprehensive MRSA colonization surveillance program. It is not intended to diagnose MRSA infection nor to guide or monitor treatment for MRSA infections. Test performance is not FDA approved in patients less than 34 years old. Performed at [facility], [address], [place], [place] [address]   Glucose, capillary     Status: Abnormal   Collection Time: 05/16/23  4:08 AM  Result Value Ref Range   Glucose-Capillary 434 (H) 70 - 99 mg/dL    Comment: Glucose reference range applies only to samples taken after fasting for at least 8 hours.   Comment 1 Notify RN     Comment 2 Document in Chart   Heparin level (unfractionated)     Status: Abnormal   Collection Time: 05/16/23  6:22 AM  Result Value Ref Range   Heparin Unfractionated 0.21 (L) 0.30 - 0.70 IU/mL    Comment: (NOTE) The clinical reportable range upper limit is being lowered to >1.10 to align with the FDA approved guidance for the current laboratory assay.  If heparin results are below expected values, and patient dosage has  been confirmed, suggest follow up testing of antithrombin III levels. Performed at [facility], [address] [place], [place] [address]   CBC     Status: Abnormal   Collection Time: 05/16/23  6:22 AM  Result Value Ref Range   WBC 13.4 (H) 4.0 - 10.5 K/uL   RBC 5.29 (H) 3.87 - 5.11 MIL/uL   Hemoglobin 15.2 (H) 12.0 - 15.0 g/dL   HCT 01.6 01.0 - 93.2 %   MCV 83.7 80.0 - 100.0 fL   MCH 28.7 26.0 - 34.0 pg   MCHC 34.3 30.0 - 36.0 g/dL   RDW 35.5 73.2 - 20.2 %   Platelets 255 150 - 400 K/uL   nRBC 0.0 0.0 - 0.2 %    Comment: Performed at [facility], [address], [place], [place] [address]  Basic metabolic panel     Status: Abnormal   Collection Time: 05/16/23  6:22 AM  Result Value Ref Range  Sodium 132 (L) 135 - 145 mmol/L   Potassium 4.7 3.5 - 5.1 mmol/L   Chloride 101 98 - 111 mmol/L   CO2 20 (L) 22 - 32 mmol/L   Glucose, Bld 485 (H) 70 - 99 mg/dL    Comment: Glucose reference range applies only to samples taken after fasting for at least 8 hours.   BUN 11 6 - 20 mg/dL   Creatinine, Ser 4.09 0.44 - 1.00 mg/dL   Calcium 8.6 (L) 8.9 - 10.3 mg/dL   GFR, Estimated >81 >19 mL/min    Comment: (NOTE) Calculated using the CKD-EPI Creatinine Equation (2021)    Anion gap 11 5 - 15    Comment: Performed at [facility], [address] [place], [place] [address]  Lipid panel     Status: Abnormal   Collection Time: 05/16/23  6:22 AM  Result Value Ref Range   Cholesterol 273 (H) 0 - 200 mg/dL   Triglycerides 956 (H) <150 mg/dL    HDL 32 (L) >21 mg/dL   Total CHOL/HDL Ratio 8.5 RATIO   VLDL UNABLE TO CALCULATE IF TRIGLYCERIDE OVER 400 mg/dL 0 - 40 mg/dL   LDL Cholesterol UNABLE TO CALCULATE IF TRIGLYCERIDE OVER 400 mg/dL 0 - 99 mg/dL    Comment:        Total Cholesterol/HDL:CHD Risk Coronary Heart Disease Risk Table                     Men   Women  1/2 Average Risk   3.4   3.3  Average Risk       5.0   4.4  2 X Average Risk   9.6   7.1  3 X Average Risk  23.4   11.0        Use the calculated Patient Ratio above and the CHD Risk Table to determine the patient's CHD Risk.        ATP III CLASSIFICATION (LDL):  <100     mg/dL   Optimal  308-657  mg/dL   Near or Above                    Optimal  130-159  mg/dL   Borderline  846-962  mg/dL   High  >952     mg/dL   Very High Performed at [facility], [address], [place], [place] [address]   TSH     Status: None   Collection Time: 05/16/23  6:22 AM  Result Value Ref Range   TSH 0.774 0.350 - 4.500 uIU/mL    Comment: Performed by a 3rd Generation assay with a functional sensitivity of <=0.01 uIU/mL. Performed at [facility], [address], [place], [place] [address]   LDL cholesterol, direct     Status: None (Preliminary result)   Collection Time: 05/16/23  6:22 AM  Result Value Ref Range   Direct LDL PENDING 0 - 99 mg/dL  Glucose, capillary     Status: Abnormal   Collection Time: 05/16/23  8:24 AM  Result Value Ref Range   Glucose-Capillary 512 (HH) 70 - 99 mg/dL    Comment: Glucose reference range applies only to samples taken after fasting for at least 8 hours.   Comment 1 Notify RN     ECG   Sinus tachycardia with anteroseptal ST elevation - Personally Reviewed  Telemetry   Sinus tachycardia - Personally Reviewed  Radiology    DG Chest Port 1  View  Result Date: 05/15/2023 CLINICAL DATA:  Shortness of breath. EXAM: PORTABLE CHEST 1 VIEW COMPARISON:  March 01, 2019 FINDINGS: The heart size and mediastinal contours are within  normal limits. A coronary artery stent is noted. Both lungs are clear. The visualized skeletal structures are unremarkable. IMPRESSION: No active disease. Electronically Signed   By: Aram Candela M.D.   On: 05/15/2023 21:13    Cardiac Studies   N/A  Assessment   Principal Problem:   Chest pain Active Problems:   New onset a-fib (HCC)   CAD S/P percutaneous coronary angioplasty   Hyponatremia   Asthma, chronic   Type 2 diabetes mellitus (HCC)   Hyperlipidemia   Tobacco use   Plan   Echo shows new LAD territory WMA which is suggestive of acute infarct. She is not having chest pain. EKG review does not suggest afib, rather sinus tach - repeat EKG shows 1-2 mm anterior ST elevation - she is on heparin. She is currently refusing cath - says she has to be discharged to take her son who is home by himself to the dentist tomorrow - she refuses insulin. She has paranoid ideas about stents and is really a poor candidate from a compliance standpoint for cath, however, it is unlikely her LV will recover without intervention. There is apical swirling which is a nidus for development of LV thrombus. She may ultimately wish to leave, but that would be AGAINST MEDICAL ADVICE. D/w hospital medicine. If medical therapy is pursued, would use combination of ASA/Plavix, but not Eliquis as I don't believe she had afib.   Time Spent Directly with Patient:  I have spent a total of 45 minutes with the patient reviewing hospital notes, telemetry, EKGs, labs and examining the patient as well as establishing an assessment and plan that was discussed personally with the patient.  > 50% of time was spent in direct patient care.  Length of Stay:  LOS: 0 days   [name], MD, [facility], FACP  [place]  [facility]  Medical Director of the Advanced Lipid Disorders &  Cardiovascular Risk Reduction Clinic Diplomate of the American Board of Clinical Lipidology Attending Cardiologist  Direct Dial:  [phone]  Fax: [phone]  Website:  [email or web address] 05/16/2023, 9:44 AM

## 2023-05-16 NOTE — Progress Notes (Signed)
Pt left AMA.  ? ?AMA form signed.  ?

## 2023-05-16 NOTE — TOC Benefit Eligibility Note (Signed)
Pharmacy Patient Advocate Encounter  Insurance verification completed.    The patient is insured through [employer] Medicare Part D  Ran test claim for Eliquis 5 mg and the current 30 day co-pay is $0.00.   This test claim was processed through [facility]- copay amounts may vary at other pharmacies due to pharmacy/plan contracts, or as the patient moves through the different stages of their insurance plan.    Nai Dasch, CPHT Pharmacy Patient Advocate Specialist [facility] Patient Advocate Team Direct Number: [phone]  Fax: [phone] 

## 2023-05-16 NOTE — Discharge Summary (Signed)
Physician Discharge Summary  Sharonn Kilmer ZOX:[id number] DOB: May 30, 1971 DOA: 05/15/2023  PCP: Default, Provider, MD  Admit date: 05/15/2023 Discharge date: 05/16/2023  Admitted From: Home Disposition:  unkown  Discharge Condition: Guarded  CODE STATUS:Full     Brief/Interim Summary: Kathleen Marks is a 52 y.o. female with medical history significant of CAD status post PCI/history of STEMI, asthma, type 2 diabetes, hyperlipidemia, tobacco use presented to ED with left-sided chest pain.  On arrival to the ED, she was noted to be in A-fib with RVR with rate in the 180s.  Subsequently converted to sinus tachycardia without any interventions and repeat EKG showing ST elevations in inferior and anterior leads.  Patient had unequal radial pulses (left greater than right) but no evidence of critical limb ischemia and no back pain.  She was not hypertensive.  Due to lower suspicion for dissection and severe contrast allergy, CTA was not done.  Labs showing WBC 11.7, hemoglobin 15.8, sodium 130, bicarb 21, anion gap 13, glucose 357, creatinine 0.8, troponin 16> 80, UDS positive for THC but no stimulant drugs.  Chest x-ray showing no active disease.  Medications administered in the ED include Tylenol, Nitropaste, and sublingual nitroglycerin.  She was also started on heparin drip.  Cardiology consulted by ED physician.  Hospitalist called for admission.  Unfortunately despite elevated troponin, chest pain consistent with cardiac etiology and profoundly elevated hyperglycemia greater than 500 this morning patient continues to refuse further care, insulin, imaging or procedure.  Cardiology recommended further inpatient hospitalization given abnormal echocardiogram suggestive of acute infarct given wall motion abnormality as well as abnormal EKG with anterior ST elevations.  Multiple members of the team attempted to explain to patient the risks of leaving the hospital AGAINST MEDICAL ADVICE however the patient  continued to state she needed to take care of her son who has MR.  She was currently on the phone with her significant other who was apparently unable to take care of the child while patient remained in the hospital.  Regardless patient left AMA we recommended return to the hospital as well as resolving possible for ongoing medical care given her emergency condition and profoundly uncontrolled symptoms.  Discharge Diagnoses:  Principal Problem:   Chest pain Active Problems:   CAD S/P percutaneous coronary angioplasty   Hyponatremia   Asthma, chronic   Type 2 diabetes mellitus (HCC)   Hyperlipidemia   Tobacco use   Non-ST elevation (NSTEMI) myocardial infarction (HCC)   Diabetes mellitus due to underlying condition, uncontrolled, with hypoglycemia without coma (HCC)   Non compliance w medication regimen   Consultations: Cardiology, Dr. Rennis Golden  Procedures/Studies: ECHOCARDIOGRAM COMPLETE  Result Date: 05/16/2023    ECHOCARDIOGRAM REPORT   Patient Name:   Kathleen Marks Date of Exam: 05/16/2023 Medical Rec #:  [id number]         Height:       63.0 in Accession #:    [id number]        Weight:       197.3 lb Date of Birth:  1971/04/21         BSA:          1.923 m Patient Age:    52 years          BP:           119/88 mmHg Patient Gender: F                 HR:  89 bpm. Exam Location:  Inpatient Procedure: 2D Echo, Cardiac Doppler, Color Doppler and Intracardiac            Opacification Agent Indications:    Arrhythmia  History:        Patient has no prior history of Echocardiogram examinations.                 Previous Myocardial Infarction, Arrythmias:Atrial Fibrillation;                 Risk Factors:Diabetes.  Sonographer:    Darlys Gales Referring Phys: [id number] VASUNDHRA RATHORE IMPRESSIONS  1. There is no left ventricular thrombus (Definity contrast was used), but there is apical swirling/slow flow. Left ventricular ejection fraction, by estimation, is 30 to 35%. The left ventricle has  moderately decreased function. The left ventricle demonstrates regional wall motion abnormalities (see scoring diagram/findings for description). Left ventricular diastolic parameters are consistent with Grade I diastolic dysfunction (impaired relaxation). There is mild dyskinesis of the left ventricular, apical anteroseptal wall and anterior wall. There is severe hypokinesis of the left ventricular, mid-apical anteroseptal wall and anterior wall.  2. Right ventricular systolic function is normal. The right ventricular size is normal. Tricuspid regurgitation signal is inadequate for assessing PA pressure.  3. The mitral valve is normal in structure. No evidence of mitral valve regurgitation.  4. The aortic valve is tricuspid. Aortic valve regurgitation is not visualized.  5. The inferior vena cava is normal in size with greater than 50% respiratory variability, suggesting right atrial pressure of 3 mmHg. FINDINGS  Left Ventricle: There is no left ventricular thrombus (Definity contrast was used), but there is apical swirling/slow flow. Left ventricular ejection fraction, by estimation, is 30 to 35%. The left ventricle has moderately decreased function. The left ventricle demonstrates regional wall motion abnormalities. Mild dyskinesis of the left ventricular, apical anteroseptal wall and anterior wall. Severe hypokinesis of the left ventricular, mid-apical anteroseptal wall and anterior wall. Definity contrast agent was given IV to delineate the left ventricular endocardial borders. The left ventricular internal cavity size was normal in size. There is no left ventricular hypertrophy. Left ventricular diastolic parameters are consistent with Grade I diastolic dysfunction (impaired relaxation). Normal left ventricular filling pressure.  LV Wall Scoring: The apical septal segment, apical anterior segment, and apex are dyskinetic. The mid anteroseptal segment, apical lateral segment, mid anterior segment, and apical  inferior segment are hypokinetic. The antero-lateral wall, inferior wall, posterior wall, basal anteroseptal segment, mid inferoseptal segment, basal anterior segment, and basal inferoseptal segment are normal. Right Ventricle: The right ventricular size is normal. No increase in right ventricular wall thickness. Right ventricular systolic function is normal. Tricuspid regurgitation signal is inadequate for assessing PA pressure. Left Atrium: Left atrial size was normal in size. Right Atrium: Right atrial size was normal in size. Pericardium: There is no evidence of pericardial effusion. Mitral Valve: The mitral valve is normal in structure. No evidence of mitral valve regurgitation. Tricuspid Valve: The tricuspid valve is normal in structure. Tricuspid valve regurgitation is not demonstrated. Aortic Valve: The aortic valve is tricuspid. Aortic valve regurgitation is not visualized. Aortic valve mean gradient measures 4.0 mmHg. Aortic valve peak gradient measures 6.0 mmHg. Aortic valve area, by VTI measures 2.28 cm. Pulmonic Valve: The pulmonic valve was not well visualized. Pulmonic valve regurgitation is not visualized. No evidence of pulmonic stenosis. Aorta: The aortic root is normal in size and structure. Venous: The inferior vena cava is normal in size with greater than 50%  respiratory variability, suggesting right atrial pressure of 3 mmHg. IAS/Shunts: No atrial level shunt detected by color flow Doppler.  LEFT VENTRICLE PLAX 2D LVIDd:         4.80 cm LVIDs:         3.60 cm LV PW:         0.90 cm LV IVS:        1.20 cm LVOT diam:     1.80 cm LV SV:         61 LV SV Index:   32 LVOT Area:     2.54 cm  LEFT ATRIUM             Index        RIGHT ATRIUM          Index LA Vol (A2C):   26.0 ml 13.52 ml/m  RA Area:     9.85 cm LA Vol (A4C):   50.8 ml 26.42 ml/m  RA Volume:   21.30 ml 11.08 ml/m LA Biplane Vol: 36.9 ml 19.19 ml/m  AORTIC VALVE AV Area (Vmax):    2.19 cm AV Area (Vmean):   2.09 cm AV Area  (VTI):     2.28 cm AV Vmax:           122.00 cm/s AV Vmean:          96.100 cm/s AV VTI:            0.266 m AV Peak Grad:      6.0 mmHg AV Mean Grad:      4.0 mmHg LVOT Vmax:         105.00 cm/s LVOT Vmean:        79.000 cm/s LVOT VTI:          0.238 m LVOT/AV VTI ratio: 0.89  AORTA Ao Root diam: 2.70 cm MITRAL VALVE MV Area (PHT): 5.42 cm     SHUNTS MV Decel Time: 140 msec     Systemic VTI:  0.24 m MV E velocity: 60.80 cm/s   Systemic Diam: 1.80 cm MV A velocity: 116.00 cm/s MV E/A ratio:  0.52 Mihai Croitoru MD Electronically signed by Thurmon Fair MD Signature Date/Time: 05/16/2023/10:00:44 AM    Final    DG Chest Port 1 View  Result Date: 05/15/2023 CLINICAL DATA:  Shortness of breath. EXAM: PORTABLE CHEST 1 VIEW COMPARISON:  March 01, 2019 FINDINGS: The heart size and mediastinal contours are within normal limits. A coronary artery stent is noted. Both lungs are clear. The visualized skeletal structures are unremarkable. IMPRESSION: No active disease. Electronically Signed   By: Aram Candela M.D.   On: 05/15/2023 21:13     Subjective: No acute issues or events overnight patient states her chest pain is not resolved but improving otherwise denies nausea vomiting headache fevers or chills   Discharge Exam: Vitals:   05/16/23 1057 05/16/23 1100  BP: 124/77 124/77  Pulse: 80 78  Resp: 18 12  Temp: 97.9 F (36.6 C)   SpO2: 97% 96%   Vitals:   05/16/23 0407 05/16/23 0827 05/16/23 1057 05/16/23 1100  BP: 114/77 119/88 124/77 124/77  Pulse: (!) 101 88 80 78  Resp: 20 14 18 12   Temp: 97.9 F (36.6 C) 98 F (36.7 C) 97.9 F (36.6 C)   TempSrc: Oral Oral Oral   SpO2: 98% 94% 97% 96%  Weight:      Height:        General: Pt is alert, awake, not in acute distress  Cardiovascular: RRR, S1/S2 +, no rubs, no gallops Respiratory: CTA bilaterally, no wheezing, no rhonchi Abdominal: Soft, NT, ND, bowel sounds + Extremities: no edema, no cyanosis    The results of significant  diagnostics from this hospitalization (including imaging, microbiology, ancillary and laboratory) are listed below for reference.     Microbiology: Recent Results (from the past 240 hour(s))  MRSA Next Gen by PCR, Nasal     Status: Abnormal   Collection Time: 05/16/23 12:28 AM   Specimen: Nasal Mucosa; Nasal Swab  Result Value Ref Range Status   MRSA by PCR Next Gen DETECTED (A) NOT DETECTED Final    Comment: RESULT CALLED TO, READ BACK BY AND VERIFIED WITH: A COOPER,RN@0231  05/16/23 MK (NOTE) The GeneXpert MRSA Assay (FDA approved for NASAL specimens only), is one component of a comprehensive MRSA colonization surveillance program. It is not intended to diagnose MRSA infection nor to guide or monitor treatment for MRSA infections. Test performance is not FDA approved in patients less than 47 years old. Performed at [facility], [address] [place], [place] [address]      Labs: BNP (last 3 results) No results for input(s): "BNP" in the last 8760 hours. Basic Metabolic Panel: Recent Labs  Lab 05/15/23 2058 05/16/23 0622  NA 130* 132*  K 3.6 4.7  CL 96* 101  CO2 21* 20*  GLUCOSE 357* 485*  BUN 9 11  CREATININE 0.81 0.78  CALCIUM 9.0 8.6*   CBC: Recent Labs  Lab 05/15/23 2058 05/16/23 0622  WBC 11.7* 13.4*  HGB 15.8* 15.2*  HCT 45.2 44.3  MCV 82.6 83.7  PLT 280 255   CBG: Recent Labs  Lab 05/16/23 0408 05/16/23 0824  GLUCAP 434* 512*   D-Dimer No results for input(s): "DDIMER" in the last 72 hours. Hgb A1c No results for input(s): "HGBA1C" in the last 72 hours. Lipid Profile Recent Labs    05/16/23 0622  CHOL 273*  HDL 32*  LDLCALC UNABLE TO CALCULATE IF TRIGLYCERIDE OVER 400 mg/dL  TRIG 478*  CHOLHDL 8.5  LDLDIRECT 165*   Thyroid function studies Recent Labs    05/16/23 0622  TSH 0.774   Sepsis Labs Recent Labs  Lab 05/15/23 2058 05/16/23 0622  WBC 11.7* 13.4*   Microbiology Recent Results (from the past 240 hour(s))   MRSA Next Gen by PCR, Nasal     Status: Abnormal   Collection Time: 05/16/23 12:28 AM   Specimen: Nasal Mucosa; Nasal Swab  Result Value Ref Range Status   MRSA by PCR Next Gen DETECTED (A) NOT DETECTED Final    Comment: RESULT CALLED TO, READ BACK BY AND VERIFIED WITH: A COOPER,RN@0231  05/16/23 MK (NOTE) The GeneXpert MRSA Assay (FDA approved for NASAL specimens only), is one component of a comprehensive MRSA colonization surveillance program. It is not intended to diagnose MRSA infection nor to guide or monitor treatment for MRSA infections. Test performance is not FDA approved in patients less than 6 years old. Performed at [facility], [address], [place], [place] [address]      Time coordinating discharge: Over 30 minutes  SIGNED:   Azucena Fallen, DO [facility] 05/16/2023, 3:57 PM Pager   If 7PM-7AM, please contact night-coverage [email or web address]

## 2023-05-16 NOTE — H&P (Signed)
History and Physical    Kathleen Marks ZOX:[id number] DOB: [birth year] DOA: 05/15/2023  PCP: Default, Provider, MD  Patient coming from: [facility] ED  Chief Complaint: Chest pain  HPI: Kathleen Marks is a 52 y.o. female with medical history significant of CAD status post PCI/history of STEMI, asthma, type 2 diabetes, hyperlipidemia, tobacco use presented to ED with left-sided chest pain.  On arrival to the ED, she was noted to be in A-fib with RVR with rate in the 180s.  Subsequently converted to sinus tachycardia without any interventions and repeat EKG showing ST elevations in inferior and anterior leads.  Patient had unequal radial pulses (left greater than right) but no evidence of critical limb ischemia and no back pain.  She was not hypertensive.  Due to lower suspicion for dissection and severe contrast allergy, CTA was not done.  Labs showing WBC 11.7, hemoglobin 15.8, sodium 130, bicarb 21, anion gap 13, glucose 357, creatinine 0.8, troponin 16> 80, UDS positive for THC but no stimulant drugs.  Chest x-ray showing no active disease.  Medications administered in the ED include Tylenol, Nitropaste, and sublingual nitroglycerin.  She was also started on heparin drip.  Cardiology consulted by ED physician.  Patient states she had a heart attack 5 years ago and had a stent placed.  Then 2.5 years ago she had chest pain and went to the hospital and was told that the stent had collapsed and they put a new stent.  She took antiplatelet agents for a year and currently does not take any medications other than an inhaler for asthma.  States this past week she has had episodes of left-sided chest pain and shortness of breath with minimal exertion.  Today while at [facility], she again had left-sided squeezing chest pain associated with shortness of breath and numbness of her jaw and left arm.  Also had palpitations.  This time symptoms lasted much longer, about 20 minutes.  After she felt better, she returned  home and then soon after started having symptoms again so her roommate drove her to the emergency room.  Chest pain has now resolved after she received nitroglycerin in the ED.  Denies history of blood clots.  Review of Systems:  Review of Systems  All other systems reviewed and are negative.   Past Medical History:  Diagnosis Date   Asthma    Diabetes mellitus without complication (HCC)    Hyperlipemia     Past Surgical History:  Procedure Laterality Date   ARTHROSCOPIC REPAIR ACL     FEMUR SURGERY       reports that she has been smoking cigarettes. She has been smoking an average of .5 packs per day. She does not have any smokeless tobacco history on file. She reports that she does not drink alcohol and does not use drugs.  Allergies  Allergen Reactions   Contrast Media [Iodinated Contrast Media] Shortness Of Breath   Penicillins Hives   Tetanus Toxoids Other (See Comments)    Pain/knots in arms   Zithromax [Azithromycin] Nausea And Vomiting    History reviewed. No pertinent family history.  Prior to Admission medications   Medication Sig Start Date End Date Taking? Authorizing Provider  acetaminophen (TYLENOL) 500 MG tablet Take 500-1,000 mg by mouth every 6 (six) hours as needed.    [provider]  albuterol (PROVENTIL HFA;VENTOLIN HFA) 108 (90 BASE) MCG/ACT inhaler Inhale 2 puffs into the lungs every 4 (four) hours as needed for wheezing or shortness of breath. 05/21/14  Piepenbrink, Jennifer, PA-C  diphenhydrAMINE (BENADRYL) 25 MG tablet Take 25 mg by mouth every 6 (six) hours as needed for itching.    [provider]  guaiFENesin (MUCINEX) 600 MG 12 hr tablet Take 600 mg by mouth 2 (two) times daily as needed (cold symptoms). For cold symptoms    [provider]  guaifenesin (ROBITUSSIN) 100 MG/5ML syrup Take 200 mg by mouth 3 (three) times daily as needed for cough.    [provider]  guaiFENesin-codeine 100-10 MG/5ML syrup Take 10  mLs by mouth every 6 (six) hours as needed for cough. 05/21/14   Piepenbrink, Victorino Dike, PA-C  ibuprofen (ADVIL,MOTRIN) 200 MG tablet Take 800-1,200 mg by mouth 2 (two) times daily as needed for moderate pain (pain).     [provider]  metFORMIN (GLUCOPHAGE) 500 MG tablet Take 500 mg by mouth 2 (two) times daily with a meal.    [provider]  predniSONE (DELTASONE) 20 MG tablet Take 2 tablets (40 mg total) by mouth daily. Patient not taking: Reported on 10/25/2014 05/21/14   Piepenbrink, Victorino Dike, PA-C  pseudoephedrine-guaifenesin (TUSSIN PE) 30-100 MG/5ML SYRP Take 5 mLs by mouth every 4 (four) hours as needed (cough).    [provider]    Physical Exam: Vitals:   05/15/23 2245 05/15/23 2300 05/15/23 2315 05/16/23 0023  BP: 107/77 117/70 114/74 117/76  Pulse: (!) 102 (!) 102 100 94  Resp: 17 (!) 21 20 20   Temp:    98.5 F (36.9 C)  TempSrc:    Oral  SpO2: 98% 98% 99%   Weight:    89.5 kg  Height:    5\' 3"  (1.6 m)    Physical Exam Vitals reviewed.  Constitutional:      General: She is not in acute distress. HENT:     Head: Normocephalic and atraumatic.  Eyes:     Extraocular Movements: Extraocular movements intact.  Cardiovascular:     Rate and Rhythm: Normal rate and regular rhythm.     Comments: Unequal pulses (radial pulse 2+ on the left and diminished on the right).  Right hand warm and no signs of acute limb ischemia. Pulmonary:     Effort: Pulmonary effort is normal. No respiratory distress.     Breath sounds: Normal breath sounds. No wheezing or rales.  Abdominal:     General: Bowel sounds are normal. There is no distension.     Palpations: Abdomen is soft.     Tenderness: There is no abdominal tenderness.  Musculoskeletal:     Cervical back: Normal range of motion.     Right lower leg: No edema.     Left lower leg: No edema.  Skin:    General: Skin is warm and dry.  Neurological:     General: No focal deficit present.     Mental  Status: She is alert and oriented to person, place, and time.     Labs on Admission: I have personally reviewed following labs and imaging studies  CBC: Recent Labs  Lab 05/15/23 2058  WBC 11.7*  HGB 15.8*  HCT 45.2  MCV 82.6  PLT 280   Basic Metabolic Panel: Recent Labs  Lab 05/15/23 2058  NA 130*  K 3.6  CL 96*  CO2 21*  GLUCOSE 357*  BUN 9  CREATININE 0.81  CALCIUM 9.0   GFR: Estimated Creatinine Clearance: 88.1 mL/min (by C-G formula based on SCr of 0.81 mg/dL). Liver Function Tests: No results for input(s): "AST", "ALT", "ALKPHOS", "BILITOT", "  PROT", "ALBUMIN" in the last 168 hours. No results for input(s): "LIPASE", "AMYLASE" in the last 168 hours. No results for input(s): "AMMONIA" in the last 168 hours. Coagulation Profile: No results for input(s): "INR", "PROTIME" in the last 168 hours. Cardiac Enzymes: No results for input(s): "CKTOTAL", "CKMB", "CKMBINDEX", "TROPONINI" in the last 168 hours. BNP (last 3 results) No results for input(s): "PROBNP" in the last 8760 hours. HbA1C: No results for input(s): "HGBA1C" in the last 72 hours. CBG: No results for input(s): "GLUCAP" in the last 168 hours. Lipid Profile: No results for input(s): "CHOL", "HDL", "LDLCALC", "TRIG", "CHOLHDL", "LDLDIRECT" in the last 72 hours. Thyroid Function Tests: No results for input(s): "TSH", "T4TOTAL", "FREET4", "T3FREE", "THYROIDAB" in the last 72 hours. Anemia Panel: No results for input(s): "VITAMINB12", "FOLATE", "FERRITIN", "TIBC", "IRON", "RETICCTPCT" in the last 72 hours. Urine analysis: No results found for: "COLORURINE", "APPEARANCEUR", "LABSPEC", "PHURINE", "GLUCOSEU", "HGBUR", "BILIRUBINUR", "KETONESUR", "PROTEINUR", "UROBILINOGEN", "NITRITE", "LEUKOCYTESUR"  Radiological Exams on Admission: DG Chest Port 1 View  Result Date: 05/15/2023 CLINICAL DATA:  Shortness of breath. EXAM: PORTABLE CHEST 1 VIEW COMPARISON:  March 01, 2019 FINDINGS: The heart size and mediastinal  contours are within normal limits. A coronary artery stent is noted. Both lungs are clear. The visualized skeletal structures are unremarkable. IMPRESSION: No active disease. Electronically Signed   By: Aram Candela M.D.   On: 05/15/2023 21:13    Assessment and Plan  Chest pain New onset A-fib with RVR History of CAD status post PCI/prior STEMI Patient presenting with 1 week history of progressively worsening episodes of left-sided chest pain with minimal exertion.  Found to be in new onset A-fib with RVR on arrival to the ED with rate in the 180s.  Subsequently converted to sinus tachycardia in the ED without any interventions and repeat EKG showing ST elevations in inferior and anterior leads (no previous EKG available for comparison).  Troponin 16> 80.  UDS positive for THC but no stimulant drugs.  Chest x-ray showing no active disease.  Patient has unequal radial pulses (left greater than right) but no evidence of critical limb ischemia.  No back pain and she is not hypertensive.  Given lower suspicion for dissection and severe contrast allergy, CTA was not done in the ED.  PE also less likely given no hypoxia.  She was satting 98-100% on room air on arrival to the ED, later placed on 2 L [place] for comfort due to complaint of dyspnea.  No clinical signs of DVT on exam.  Continue cardiac monitoring and IV heparin.  Check TSH and echocardiogram ordered.  Cardiology consulted and currently at bedside talking to the patient.  Will keep n.p.o. until further recommendations from cardiology.  Also patient will benefit from vascular surgery evaluation during daytime given unequal upper extremity pulses.  She smokes half a pack of cigarettes daily, continue to counsel her to quit smoking.  Mild hyponatremia IV fluid hydration and monitor labs.  Asthma Stable, no signs of acute exacerbation.  Xopenex as needed.  Poorly controlled type 2 diabetes A1c 11.7 on 01/04/2023 on labs done at [facility].  Glucose in  the 300s without signs of DKA.  She is not taking any medications for diabetes at home.  Will repeat A1c and order sensitive sliding scale insulin every 4 hours for now as patient has been made n.p.o.  Hyperlipidemia Not on a statin.  Lipid panel ordered.  Cigarette smoking NicoDerm patch and continue to counsel to quit.  DVT prophylaxis: IV heparin gtt Code  Status: Full Code (discussed with the patient) Family Communication: No family available at this time. Consults called: Cardiology Level of care: Telemetry bed Admission status: It is my clinical opinion that referral for OBSERVATION is reasonable and necessary in this patient based on the above information provided. The aforementioned taken together are felt to place the patient at high risk for further clinical deterioration. However, it is anticipated that the patient may be medically stable for discharge from the hospital within 24 to 48 hours.   John Giovanni MD [facility]  If 7PM-7AM, please contact night-coverage [email or web address]  05/16/2023, 12:38 AM

## 2023-05-16 NOTE — Consult Note (Addendum)
Cardiology Consultation   Patient ID: Kathleen Marks MRN: [id number]; DOB: 04-12-1971  Admit date: 05/15/2023 Date of Consult: 05/16/2023  PCP:  Default, Provider, MD   [facility] Providers Cardiologist:  None        Patient Profile:   Kathleen Marks is a 52 y.o. female with a hx of CAD s/p PCI (OM PCI 2019 in the setting of NSTEMI, re-stented due to ISR in 2021), HTN, HLD, DM, obesity active smoker and marihuana use who is being seen 05/16/2023 for the evaluation of chest pain at the request of IM.  History of Present Illness:   [name] started having symptoms a week ago. She states that she had sudden onset of shortness of breath initially lasting 30 seconds and resolving. The frequency of the symptoms increased without particular triggers and the next day she started feeling associated left sided chest pain. Initial duration was seconds as well but since the past 3 days the pain became more constant. Yesterday, she had an episode of chest pain lasting 20 min while doing groceries at [employer]. This was associated with diaphoresis and nausea. She had another episode at rest during the night which resembled her prior MI and decided to come in.  In the ED BP of 145/94, HR 121 in afib. New diagnosis for her.  Initial labs showed trops of 16 which increased to 80. Elevated glucose as well.    Past Medical History:  Diagnosis Date   Asthma    Diabetes mellitus without complication (HCC)    Hyperlipemia     Past Surgical History:  Procedure Laterality Date   ARTHROSCOPIC REPAIR ACL     FEMUR SURGERY       Home Medications: albuterol  Inpatient Medications: Scheduled Meds:  Continuous Infusions:  heparin 1,200 Units/hr (05/15/23 2309)   PRN Meds: nitroGLYCERIN  Allergies:    Allergies  Allergen Reactions   Contrast Media [Iodinated Contrast Media] Shortness Of Breath   Penicillins Hives   Tetanus Toxoids Other (See Comments)    Pain/knots in arms    Zithromax [Azithromycin] Nausea And Vomiting    Social History:   Social History   Socioeconomic History   Marital status: Married    Spouse name: Not on file   Number of children: Not on file   Years of education: Not on file   Highest education level: Not on file  Occupational History   Not on file  Tobacco Use   Smoking status: Every Day    Packs/day: .5    Types: Cigarettes   Smokeless tobacco: Not on file  Substance and Sexual Activity   Alcohol use: No   Drug use: No   Sexual activity: Not on file  Other Topics Concern   Not on file  Social History Narrative   Not on file   Social Determinants of Health   Financial Resource Strain: Not on file  Food Insecurity: Not on file  Transportation Needs: Not on file  Physical Activity: Not on file  Stress: Not on file  Social Connections: Not on file  Intimate Partner Violence: Not on file    Family History:   History reviewed. No pertinent family history.   ROS:  Please see the history of present illness.   All other ROS reviewed and negative.     Physical Exam/Data:   Vitals:   05/15/23 2245 05/15/23 2300 05/15/23 2315 05/16/23 0023  BP: 107/77 117/70 114/74 117/76  Pulse: (!) 102 (!) 102 100  94  Resp: 17 (!) 21 20 20   Temp:    98.5 F (36.9 C)  TempSrc:    Oral  SpO2: 98% 98% 99%   Weight:    89.5 kg  Height:    5\' 3"  (1.6 m)   No intake or output data in the 24 hours ending 05/16/23 0027    05/16/2023   12:23 AM 05/15/2023    8:42 PM 05/21/2014    4:44 AM  Last 3 Weights  Weight (lbs) 197 lb 5 oz 200 lb 270 lb  Weight (kg) 89.5 kg 90.719 kg 122.471 kg     Body mass index is 34.95 kg/m.  General:  Well nourished, well developed, in no acute distress HEENT: normal Neck: no JVD Vascular: absent right radial pulse Cardiac:  normal S1, S2; RRR; no murmur  Lungs:  clear to auscultation bilaterally, no wheezing, rhonchi or rales  Abd: soft, nontender, no hepatomegaly  Ext: no  edema Musculoskeletal:  No deformities, BUE and BLE strength normal and equal Skin: warm and dry  Neuro:  CNs 2-12 intact, no focal abnormalities noted Psych:  Normal affect   EKG:  The EKG was personally reviewed and demonstrates:  NSR  Relevant CV Studies:  Laboratory Data:  High Sensitivity Troponin:   Recent Labs  Lab 05/15/23 2058 05/15/23 2313  TROPONINIHS 16 80*     Chemistry Recent Labs  Lab 05/15/23 2058  NA 130*  K 3.6  CL 96*  CO2 21*  GLUCOSE 357*  BUN 9  CREATININE 0.81  CALCIUM 9.0  GFRNONAA >60  ANIONGAP 13    No results for input(s): "PROT", "ALBUMIN", "AST", "ALT", "ALKPHOS", "BILITOT" in the last 168 hours. Lipids No results for input(s): "CHOL", "TRIG", "HDL", "LABVLDL", "LDLCALC", "CHOLHDL" in the last 168 hours.  Hematology Recent Labs  Lab 05/15/23 2058  WBC 11.7*  RBC 5.47*  HGB 15.8*  HCT 45.2  MCV 82.6  MCH 28.9  MCHC 35.0  RDW 13.2  PLT 280   Thyroid No results for input(s): "TSH", "FREET4" in the last 168 hours.  BNPNo results for input(s): "BNP", "PROBNP" in the last 168 hours.  DDimer No results for input(s): "DDIMER" in the last 168 hours.   Radiology/Studies:  DG Chest Port 1 View  Result Date: 05/15/2023 CLINICAL DATA:  Shortness of breath. EXAM: PORTABLE CHEST 1 VIEW COMPARISON:  March 01, 2019 FINDINGS: The heart size and mediastinal contours are within normal limits. A coronary artery stent is noted. Both lungs are clear. The visualized skeletal structures are unremarkable. IMPRESSION: No active disease. Electronically Signed   By: Aram Candela M.D.   On: 05/15/2023 21:13     Assessment and Plan:   # New onset atrial fibrillation with RVR now back in sinus rhythm # Type 2 MI # History of CAD with OM PCI # Reported allergy to contrast # Medication non compliance - 1 week of intermittent SOB and chest pain described as left sided, radiating to the neck and left arm. - Hx of NSTEMI in 07/12/2018 resulting in PCI  of the OM2 (2.25 x28 Synergy DES). In 07/13/2020 she required restenting due to ISR of the previosly placed OM stent (received a 2.5 x 24 Synergy DES, postdil with a 2.5 Dix). Small non dominant RCA was reported to have mid 80-90% diffuse disease. - Last ischemic evaluation: pharm MPI on 02/23/2022 showing fixed inferior wall defect thought to be attenuaiton artifact. EF 64%, TID 0.8 - Last echo in 2021 showed LVEF of  53% and no significant valvular disease - Trop 16 -> 80 - UDS +ve for cannabinoids, -ve for cocaine - ECG  PLAN: - Patient with health literally issues. She states that she can "feel her stent if she touches her skin". I explained to her that this is not possible but patient is convinced she does, and that her body is "rejecting it". She stopped all her medications because she stated that she did not trust she needed them. She is not even on insulin because she thinks is not necessary. She is clearly not a good candidate for invasive coronary assessment let alone for re-stenting. I believe that her clinical presentation is consistent with symptomatic atrial fibrillation with RVR manifested as angina as a consequence of her untreated RCA lesion. The troponin elevation can be explained by a type 2 MI. I explained this to her to which she replied "I will keep coming back until my stent is looked at because I know is closed". If invasive coronary assessment is pursued I would have a high threshold for intervention as medication compliance is an issue. She will receive pre treatment with steroids in the event that LHC is pursued in the morning but, as I stated to her, I would favor medical management. - Of note, she had no palpable right radial pulse. Her radial likely occluded after her first cath in 2019. Last cath done via femoral acces.  - Load with ASA 324 and continue ASA 81 mg daily - Start Crestor 40 mg daily - Start lopressor 25 mg BID - We spoke about transitioning to eliquis on  discharge which she agreed with.  - Hold second P2Y12. - Continue heparin drip. - Lipid panel, Lp(a) - Need iodinated contrast pretreatment. Start protocol with prednisone - Echocardiogram in the morning - Telemetry monitoring - NPO  # Hypertension - BP on arrival 145/94 mmHg - Lopressor as above  # Active smoker  # Active cannabinoid use # Former cocaine use - States that she has been smoking since the of 52 years old.  - Usin marihuana between 3-5 times per week  # Uncontrolled Type 2 DM - BG on arrival of 357 - Last HbA1c on 07/2022 was 12.5 - Self discontinued insulin. Needs counseling by IM team  # Obesity  Risk Assessment/Risk Scores:                For questions or updates, please contact [facility] Please consult [email or web address] for contact info under    Signed, Regan Rakers, MD  05/16/2023 12:27 AM

## 2023-05-16 NOTE — Inpatient Diabetes Management (Signed)
Inpatient Diabetes Program Recommendations  AACE/ADA: New Consensus Statement on Inpatient Glycemic Control (2015)  Target Ranges:  Prepandial:   less than 140 mg/dL      Peak postprandial:   less than 180 mg/dL (1-2 hours)      Critically ill patients:  140 - 180 mg/dL   Lab Results  Component Value Date   GLUCAP 512 (HH) 05/16/2023    Latest Reference Range & Units 05/16/23 04:08 05/16/23 08:24  Glucose-Capillary 70 - 99 mg/dL 914 (H) 782 (HH)  (HH): Data is critically high (H): Data is abnormally high  Diabetes history: DM2 Outpatient Diabetes medications: Metformin 500 mg bid (states not taking) Current orders for Inpatient glycemic control: Novolog 0-9 units q 6 hrs., Prednisone 50 mg q 6 hrs.  Inpatient Diabetes Program Recommendations:   Spoke with patient @ bedside to discuss current CBG 512. Patient is concerned about her special needs child @ home and is concerned that insulin will be long term. Explained to patient that her CBGs are elevated more due to steroids @ this time, but patient states she does not want to take insulin regardless if long term or not, nor regardless of how high her CBGs are. Discussed risks of elevated CBGs with pt. States her CBGs are 700-800 sometimes @ home and she feels fine. Reviewed risks of elevated blood sugars even though she may be asymptomatic.  Thank you, Billy Fischer. Sinaya Minogue, RN, MSN, CDE  Diabetes Coordinator Inpatient Glycemic Control Team Team Pager [phone] (8am-5pm) 05/16/2023 11:00 AM

## 2023-05-16 NOTE — Progress Notes (Signed)
Patient Kathleen Marks, Dr. Julian Reil notified.  Ok to give 9 units per sliding scale.  Took insulin to patient, patient refused.  Explained to patient importance of managing glucose.  Pt states she doesn't care what her sugar is she is not taking insulin.  Dr. Julian Reil made aware.

## 2023-05-16 NOTE — ED Notes (Signed)
Notified Dr. Julian Reil that patients delta trop resulted at 80 from 16.

## 2023-05-16 NOTE — Plan of Care (Signed)

## 2023-05-17 ENCOUNTER — Inpatient Hospital Stay (HOSPITAL_COMMUNITY)
Admission: EM | Admit: 2023-05-17 | Discharge: 2023-05-21 | DRG: 321 | Disposition: A | Discharge status: Home / Self Care | Payer: Medicare HMO | Attending: Internal Medicine | Admitting: Internal Medicine

## 2023-05-17 ENCOUNTER — Other Ambulatory Visit: Payer: Self-pay

## 2023-05-17 DIAGNOSIS — I110 Hypertensive heart disease with heart failure: Secondary | ICD-10-CM | POA: Diagnosis present

## 2023-05-17 DIAGNOSIS — L03211 Cellulitis of face: Secondary | ICD-10-CM | POA: Diagnosis present

## 2023-05-17 DIAGNOSIS — E669 Obesity, unspecified: Secondary | ICD-10-CM | POA: Diagnosis present

## 2023-05-17 DIAGNOSIS — I214 Non-ST elevation (NSTEMI) myocardial infarction: Principal | ICD-10-CM | POA: Diagnosis present

## 2023-05-17 DIAGNOSIS — E1165 Type 2 diabetes mellitus with hyperglycemia: Secondary | ICD-10-CM | POA: Diagnosis present

## 2023-05-17 DIAGNOSIS — Z9102 Food additives allergy status: Secondary | ICD-10-CM

## 2023-05-17 DIAGNOSIS — Z880 Allergy status to penicillin: Secondary | ICD-10-CM

## 2023-05-17 DIAGNOSIS — I4891 Unspecified atrial fibrillation: Secondary | ICD-10-CM | POA: Diagnosis present

## 2023-05-17 DIAGNOSIS — I4892 Unspecified atrial flutter: Secondary | ICD-10-CM | POA: Diagnosis present

## 2023-05-17 DIAGNOSIS — E785 Hyperlipidemia, unspecified: Secondary | ICD-10-CM | POA: Diagnosis present

## 2023-05-17 DIAGNOSIS — Z79899 Other long term (current) drug therapy: Secondary | ICD-10-CM

## 2023-05-17 DIAGNOSIS — Z888 Allergy status to other drugs, medicaments and biological substances status: Secondary | ICD-10-CM

## 2023-05-17 DIAGNOSIS — Z955 Presence of coronary angioplasty implant and graft: Secondary | ICD-10-CM

## 2023-05-17 DIAGNOSIS — I5022 Chronic systolic (congestive) heart failure: Secondary | ICD-10-CM

## 2023-05-17 DIAGNOSIS — I5043 Acute on chronic combined systolic (congestive) and diastolic (congestive) heart failure: Secondary | ICD-10-CM

## 2023-05-17 DIAGNOSIS — I5021 Acute systolic (congestive) heart failure: Secondary | ICD-10-CM | POA: Diagnosis present

## 2023-05-17 DIAGNOSIS — I249 Acute ischemic heart disease, unspecified: Principal | ICD-10-CM

## 2023-05-17 DIAGNOSIS — Z881 Allergy status to other antibiotic agents status: Secondary | ICD-10-CM

## 2023-05-17 DIAGNOSIS — E119 Type 2 diabetes mellitus without complications: Secondary | ICD-10-CM

## 2023-05-17 DIAGNOSIS — J45909 Unspecified asthma, uncomplicated: Secondary | ICD-10-CM | POA: Diagnosis present

## 2023-05-17 DIAGNOSIS — F17210 Nicotine dependence, cigarettes, uncomplicated: Secondary | ICD-10-CM | POA: Diagnosis present

## 2023-05-17 DIAGNOSIS — Z7984 Long term (current) use of oral hypoglycemic drugs: Secondary | ICD-10-CM

## 2023-05-17 DIAGNOSIS — Z91041 Radiographic dye allergy status: Secondary | ICD-10-CM

## 2023-05-17 DIAGNOSIS — I252 Old myocardial infarction: Secondary | ICD-10-CM

## 2023-05-17 DIAGNOSIS — E871 Hypo-osmolality and hyponatremia: Secondary | ICD-10-CM | POA: Diagnosis present

## 2023-05-17 DIAGNOSIS — Z66 Do not resuscitate: Secondary | ICD-10-CM | POA: Diagnosis present

## 2023-05-17 DIAGNOSIS — Z91128 Patient's intentional underdosing of medication regimen for other reason: Secondary | ICD-10-CM

## 2023-05-17 DIAGNOSIS — Z7951 Long term (current) use of inhaled steroids: Secondary | ICD-10-CM

## 2023-05-17 DIAGNOSIS — Z91148 Patient's other noncompliance with medication regimen for other reason: Secondary | ICD-10-CM

## 2023-05-17 DIAGNOSIS — T383X6A Underdosing of insulin and oral hypoglycemic [antidiabetic] drugs, initial encounter: Secondary | ICD-10-CM | POA: Diagnosis present

## 2023-05-17 DIAGNOSIS — Z6834 Body mass index (BMI) 34.0-34.9, adult: Secondary | ICD-10-CM

## 2023-05-17 DIAGNOSIS — I2510 Atherosclerotic heart disease of native coronary artery without angina pectoris: Secondary | ICD-10-CM | POA: Diagnosis present

## 2023-05-17 LAB — CBC
HCT: 43.1 % (ref 36.0–46.0)
Hemoglobin: 14.7 g/dL (ref 12.0–15.0)
MCH: 28.6 pg (ref 26.0–34.0)
MCHC: 34.1 g/dL (ref 30.0–36.0)
MCV: 83.9 fL (ref 80.0–100.0)
Platelets: 273 10*3/uL (ref 150–400)
RBC: 5.14 MIL/uL — ABNORMAL HIGH (ref 3.87–5.11)
RDW: 13.3 % (ref 11.5–15.5)
WBC: 18.8 10*3/uL — ABNORMAL HIGH (ref 4.0–10.5)
nRBC: 0 % (ref 0.0–0.2)

## 2023-05-17 LAB — HEMOGLOBIN A1C
Hgb A1c MFr Bld: 11.2 % — ABNORMAL HIGH (ref 4.8–5.6)
Mean Plasma Glucose: 275 mg/dL

## 2023-05-17 LAB — BASIC METABOLIC PANEL
Anion gap: 16 — ABNORMAL HIGH (ref 5–15)
BUN: 16 mg/dL (ref 6–20)
CO2: 21 mmol/L — ABNORMAL LOW (ref 22–32)
Calcium: 9.1 mg/dL (ref 8.9–10.3)
Chloride: 97 mmol/L — ABNORMAL LOW (ref 98–111)
Creatinine, Ser: 0.87 mg/dL (ref 0.44–1.00)
GFR, Estimated: >60 mL/min (ref >60)
Glucose, Bld: 384 mg/dL — ABNORMAL HIGH (ref 70–99)
Potassium: 3.7 mmol/L (ref 3.5–5.1)
Sodium: 134 mmol/L — ABNORMAL LOW (ref 135–145)

## 2023-05-17 LAB — TROPONIN I (HIGH SENSITIVITY)
Troponin I (High Sensitivity): 573 ng/L (ref <18)
Troponin I (High Sensitivity): 620 ng/L (ref <18)

## 2023-05-17 LAB — HCG, SERUM, QUALITATIVE: Preg, Serum: NEGATIVE

## 2023-05-17 NOTE — ED Triage Notes (Signed)
Pt arrives to ED reporting that she had an appointment for cardiac stent placement but left AMA for family issues. Pt is checking back in for same reason. Pt also endorses swelling and sore to left side of face from solution that was used pre-op. Pt is unsure of what solution was. Pt w/ hx of a-fib

## 2023-05-18 ENCOUNTER — Inpatient Hospital Stay (HOSPITAL_COMMUNITY): Payer: Medicare HMO

## 2023-05-18 ENCOUNTER — Emergency Department (HOSPITAL_COMMUNITY): Payer: Medicare HMO

## 2023-05-18 ENCOUNTER — Encounter (HOSPITAL_COMMUNITY): Payer: Self-pay | Admitting: Family Medicine

## 2023-05-18 DIAGNOSIS — L03211 Cellulitis of face: Secondary | ICD-10-CM | POA: Diagnosis not present

## 2023-05-18 DIAGNOSIS — Z888 Allergy status to other drugs, medicaments and biological substances status: Secondary | ICD-10-CM | POA: Diagnosis not present

## 2023-05-18 DIAGNOSIS — Z881 Allergy status to other antibiotic agents status: Secondary | ICD-10-CM | POA: Diagnosis not present

## 2023-05-18 DIAGNOSIS — Z91148 Patient's other noncompliance with medication regimen for other reason: Secondary | ICD-10-CM | POA: Diagnosis not present

## 2023-05-18 DIAGNOSIS — I5022 Chronic systolic (congestive) heart failure: Secondary | ICD-10-CM | POA: Diagnosis not present

## 2023-05-18 DIAGNOSIS — Z7984 Long term (current) use of oral hypoglycemic drugs: Secondary | ICD-10-CM | POA: Diagnosis not present

## 2023-05-18 DIAGNOSIS — I2510 Atherosclerotic heart disease of native coronary artery without angina pectoris: Secondary | ICD-10-CM | POA: Diagnosis not present

## 2023-05-18 DIAGNOSIS — I5043 Acute on chronic combined systolic (congestive) and diastolic (congestive) heart failure: Secondary | ICD-10-CM | POA: Diagnosis not present

## 2023-05-18 DIAGNOSIS — T383X6A Underdosing of insulin and oral hypoglycemic [antidiabetic] drugs, initial encounter: Secondary | ICD-10-CM | POA: Diagnosis present

## 2023-05-18 DIAGNOSIS — Z955 Presence of coronary angioplasty implant and graft: Secondary | ICD-10-CM | POA: Diagnosis not present

## 2023-05-18 DIAGNOSIS — E871 Hypo-osmolality and hyponatremia: Secondary | ICD-10-CM | POA: Diagnosis present

## 2023-05-18 DIAGNOSIS — J45909 Unspecified asthma, uncomplicated: Secondary | ICD-10-CM | POA: Diagnosis present

## 2023-05-18 DIAGNOSIS — Z9861 Coronary angioplasty status: Secondary | ICD-10-CM

## 2023-05-18 DIAGNOSIS — I5021 Acute systolic (congestive) heart failure: Secondary | ICD-10-CM | POA: Diagnosis present

## 2023-05-18 DIAGNOSIS — E119 Type 2 diabetes mellitus without complications: Secondary | ICD-10-CM | POA: Diagnosis not present

## 2023-05-18 DIAGNOSIS — I4891 Unspecified atrial fibrillation: Secondary | ICD-10-CM | POA: Diagnosis present

## 2023-05-18 DIAGNOSIS — I4892 Unspecified atrial flutter: Secondary | ICD-10-CM | POA: Diagnosis present

## 2023-05-18 DIAGNOSIS — E669 Obesity, unspecified: Secondary | ICD-10-CM | POA: Diagnosis present

## 2023-05-18 DIAGNOSIS — J45998 Other asthma: Secondary | ICD-10-CM | POA: Diagnosis not present

## 2023-05-18 DIAGNOSIS — Z91128 Patient's intentional underdosing of medication regimen for other reason: Secondary | ICD-10-CM | POA: Diagnosis not present

## 2023-05-18 DIAGNOSIS — Z79899 Other long term (current) drug therapy: Secondary | ICD-10-CM | POA: Diagnosis not present

## 2023-05-18 DIAGNOSIS — Z91041 Radiographic dye allergy status: Secondary | ICD-10-CM | POA: Diagnosis not present

## 2023-05-18 DIAGNOSIS — I214 Non-ST elevation (NSTEMI) myocardial infarction: Secondary | ICD-10-CM | POA: Diagnosis present

## 2023-05-18 DIAGNOSIS — E1165 Type 2 diabetes mellitus with hyperglycemia: Secondary | ICD-10-CM | POA: Diagnosis present

## 2023-05-18 DIAGNOSIS — Z66 Do not resuscitate: Secondary | ICD-10-CM | POA: Diagnosis present

## 2023-05-18 DIAGNOSIS — F17210 Nicotine dependence, cigarettes, uncomplicated: Secondary | ICD-10-CM | POA: Diagnosis not present

## 2023-05-18 DIAGNOSIS — Z9102 Food additives allergy status: Secondary | ICD-10-CM | POA: Diagnosis not present

## 2023-05-18 DIAGNOSIS — I110 Hypertensive heart disease with heart failure: Secondary | ICD-10-CM | POA: Diagnosis present

## 2023-05-18 DIAGNOSIS — E785 Hyperlipidemia, unspecified: Secondary | ICD-10-CM | POA: Diagnosis present

## 2023-05-18 DIAGNOSIS — R220 Localized swelling, mass and lump, head: Secondary | ICD-10-CM | POA: Diagnosis present

## 2023-05-18 DIAGNOSIS — Z880 Allergy status to penicillin: Secondary | ICD-10-CM | POA: Diagnosis not present

## 2023-05-18 DIAGNOSIS — E1169 Type 2 diabetes mellitus with other specified complication: Secondary | ICD-10-CM | POA: Diagnosis not present

## 2023-05-18 DIAGNOSIS — I252 Old myocardial infarction: Secondary | ICD-10-CM | POA: Diagnosis not present

## 2023-05-18 DIAGNOSIS — Z7982 Long term (current) use of aspirin: Secondary | ICD-10-CM | POA: Diagnosis not present

## 2023-05-18 LAB — MAGNESIUM: Magnesium: 1.6 mg/dL — ABNORMAL LOW (ref 1.7–2.4)

## 2023-05-18 LAB — CBG MONITORING, ED
Glucose-Capillary: 238 mg/dL — ABNORMAL HIGH (ref 70–99)
Glucose-Capillary: 226 mg/dL — ABNORMAL HIGH (ref 70–99)

## 2023-05-18 LAB — GLUCOSE, CAPILLARY
Glucose-Capillary: 266 mg/dL — ABNORMAL HIGH (ref 70–99)
Glucose-Capillary: 403 mg/dL — ABNORMAL HIGH (ref 70–99)

## 2023-05-18 LAB — HEPARIN LEVEL (UNFRACTIONATED)
Heparin Unfractionated: 0.24 IU/mL — ABNORMAL LOW (ref 0.30–0.70)
Heparin Unfractionated: 0.1 IU/mL — ABNORMAL LOW (ref 0.30–0.70)

## 2023-05-18 LAB — CBC
HCT: 44.1 % (ref 36.0–46.0)
Hemoglobin: 15.1 g/dL — ABNORMAL HIGH (ref 12.0–15.0)
MCH: 28.4 pg (ref 26.0–34.0)
MCHC: 34.2 g/dL (ref 30.0–36.0)
MCV: 82.9 fL (ref 80.0–100.0)
Platelets: 282 10*3/uL (ref 150–400)
RBC: 5.32 MIL/uL — ABNORMAL HIGH (ref 3.87–5.11)
RDW: 13.4 % (ref 11.5–15.5)
WBC: 18.1 10*3/uL — ABNORMAL HIGH (ref 4.0–10.5)
nRBC: 0 % (ref 0.0–0.2)

## 2023-05-18 LAB — BASIC METABOLIC PANEL
Anion gap: 11 (ref 5–15)
BUN: 10 mg/dL (ref 6–20)
CO2: 20 mmol/L — ABNORMAL LOW (ref 22–32)
Calcium: 8.6 mg/dL — ABNORMAL LOW (ref 8.9–10.3)
Chloride: 101 mmol/L (ref 98–111)
Creatinine, Ser: 0.69 mg/dL (ref 0.44–1.00)
GFR, Estimated: >60 mL/min (ref >60)
Glucose, Bld: 300 mg/dL — ABNORMAL HIGH (ref 70–99)
Potassium: 3.5 mmol/L (ref 3.5–5.1)
Sodium: 132 mmol/L — ABNORMAL LOW (ref 135–145)

## 2023-05-18 LAB — TROPONIN I (HIGH SENSITIVITY): Troponin I (High Sensitivity): 585 ng/L (ref <18)

## 2023-05-18 MED ORDER — VANCOMYCIN HCL 1.25 G IV SOLR: 1250.0000 mg | INTRAVENOUS | Status: DC

## 2023-05-18 MED ORDER — SODIUM CHLORIDE 0.9% FLUSH
3.0000 mL | Freq: Two times a day (BID) | INTRAVENOUS | Status: DC
  Administered 2023-05-18 – 2023-05-21 (×6): 3 mL via INTRAVENOUS

## 2023-05-18 MED ORDER — SODIUM CHLORIDE 0.9 % IV SOLN: INTRAVENOUS | Status: DC

## 2023-05-18 MED ORDER — DIPHENHYDRAMINE HCL 25 MG PO CAPS: 50.0000 mg | ORAL_CAPSULE | Freq: Once | ORAL | Status: DC

## 2023-05-18 MED ORDER — INSULIN ASPART 100 UNIT/ML IJ SOLN
0.0000 [IU] | INTRAMUSCULAR | Status: DC
  Administered 2023-05-18: 3 [IU] via SUBCUTANEOUS

## 2023-05-18 MED ORDER — ONDANSETRON HCL 4 MG/2ML IJ SOLN
4.0000 mg | Freq: Four times a day (QID) | INTRAMUSCULAR | Status: DC | PRN
  Administered 2023-05-18 – 2023-05-20 (×3): 4 mg via INTRAVENOUS
  Filled 2023-05-18 (×3): qty 2

## 2023-05-18 MED ORDER — OXYCODONE HCL 5 MG PO TABS
5.0000 mg | ORAL_TABLET | ORAL | Status: DC | PRN
  Administered 2023-05-18 – 2023-05-20 (×4): 5 mg via ORAL
  Filled 2023-05-18 (×5): qty 1

## 2023-05-18 MED ORDER — METOPROLOL TARTRATE 25 MG PO TABS
25.0000 mg | ORAL_TABLET | Freq: Two times a day (BID) | ORAL | Status: DC
  Administered 2023-05-18 – 2023-05-21 (×7): 25 mg via ORAL
  Filled 2023-05-18 (×7): qty 1

## 2023-05-18 MED ORDER — SODIUM CHLORIDE 0.9 % IV SOLN
1.0000 g | Freq: Once | INTRAVENOUS | Status: AC
  Administered 2023-05-18: 1 g via INTRAVENOUS
  Filled 2023-05-18: qty 10

## 2023-05-18 MED ORDER — ASPIRIN 81 MG PO CHEW
324.0000 mg | CHEWABLE_TABLET | ORAL | Status: AC
  Administered 2023-05-18: 324 mg via ORAL
  Filled 2023-05-18: qty 4

## 2023-05-18 MED ORDER — SODIUM CHLORIDE 0.9 % IV SOLN: 250.0000 mL | INTRAVENOUS | Status: DC | PRN

## 2023-05-18 MED ORDER — ATORVASTATIN CALCIUM 80 MG PO TABS
80.0000 mg | ORAL_TABLET | Freq: Every day | ORAL | Status: DC
  Administered 2023-05-18 – 2023-05-21 (×4): 80 mg via ORAL
  Filled 2023-05-18 (×2): qty 1
  Filled 2023-05-18: qty 2
  Filled 2023-05-18: qty 1

## 2023-05-18 MED ORDER — HYDROMORPHONE HCL 1 MG/ML IJ SOLN
0.5000 mg | INTRAMUSCULAR | Status: DC | PRN
  Administered 2023-05-18 – 2023-05-20 (×3): 0.5 mg via INTRAVENOUS
  Filled 2023-05-18 (×3): qty 1

## 2023-05-18 MED ORDER — HEPARIN (PORCINE) 25000 UT/250ML-% IV SOLN
1700.0000 [IU]/h | INTRAVENOUS | Status: DC
  Administered 2023-05-19: 1700 [IU]/h via INTRAVENOUS
  Filled 2023-05-18: qty 250

## 2023-05-18 MED ORDER — MOMETASONE FURO-FORMOTEROL FUM 200-5 MCG/ACT IN AERO
2.0000 | INHALATION_SPRAY | Freq: Two times a day (BID) | RESPIRATORY_TRACT | Status: DC
  Administered 2023-05-18 – 2023-05-21 (×5): 2 via RESPIRATORY_TRACT
  Filled 2023-05-18 (×2): qty 8.8

## 2023-05-18 MED ORDER — ASPIRIN 300 MG RE SUPP: 300.0000 mg | RECTAL | Status: AC

## 2023-05-18 MED ORDER — ACETAMINOPHEN 500 MG PO TABS
1000.0000 mg | ORAL_TABLET | Freq: Four times a day (QID) | ORAL | Status: DC | PRN
  Administered 2023-05-20: 1000 mg via ORAL
  Filled 2023-05-18 (×2): qty 2

## 2023-05-18 MED ORDER — INSULIN ASPART 100 UNIT/ML IJ SOLN
0.0000 [IU] | Freq: Three times a day (TID) | INTRAMUSCULAR | Status: DC
  Administered 2023-05-18 – 2023-05-19 (×3): 11 [IU] via SUBCUTANEOUS
  Administered 2023-05-19 – 2023-05-20 (×2): 15 [IU] via SUBCUTANEOUS
  Administered 2023-05-20: 20 [IU] via SUBCUTANEOUS
  Administered 2023-05-20 – 2023-05-21 (×2): 11 [IU] via SUBCUTANEOUS
  Administered 2023-05-21: 7 [IU] via SUBCUTANEOUS

## 2023-05-18 MED ORDER — DIPHENHYDRAMINE HCL 25 MG PO CAPS
50.0000 mg | ORAL_CAPSULE | Freq: Once | ORAL | Status: AC
  Administered 2023-05-18: 50 mg via ORAL
  Filled 2023-05-18: qty 2

## 2023-05-18 MED ORDER — ACETAMINOPHEN 325 MG PO TABS: 650.0000 mg | ORAL_TABLET | ORAL | Status: DC | PRN

## 2023-05-18 MED ORDER — VANCOMYCIN HCL 1500 MG/300ML IV SOLN
1500.0000 mg | Freq: Once | INTRAVENOUS | Status: AC
  Administered 2023-05-18: 1500 mg via INTRAVENOUS
  Filled 2023-05-18: qty 300

## 2023-05-18 MED ORDER — INSULIN GLARGINE-YFGN 100 UNIT/ML ~~LOC~~ SOLN
20.0000 [IU] | Freq: Every day | SUBCUTANEOUS | Status: DC
  Administered 2023-05-18 – 2023-05-19 (×2): 20 [IU] via SUBCUTANEOUS
  Filled 2023-05-18 (×2): qty 0.2

## 2023-05-18 MED ORDER — HEPARIN BOLUS VIA INFUSION
4000.0000 [IU] | Freq: Once | INTRAVENOUS | Status: AC
  Filled 2023-05-18: qty 4000

## 2023-05-18 MED ORDER — DIPHENHYDRAMINE HCL 25 MG PO CAPS
50.0000 mg | ORAL_CAPSULE | Freq: Once | ORAL | Status: DC
  Administered 2023-05-19: 50 mg via ORAL
  Filled 2023-05-18: qty 2

## 2023-05-18 MED ORDER — SODIUM CHLORIDE 0.9% FLUSH: 3.0000 mL | INTRAVENOUS | Status: DC | PRN

## 2023-05-18 MED ORDER — HEPARIN BOLUS VIA INFUSION
2000.0000 [IU] | Freq: Once | INTRAVENOUS | Status: AC
  Administered 2023-05-18: 2000 [IU] via INTRAVENOUS
  Filled 2023-05-18: qty 2000

## 2023-05-18 MED ORDER — HEPARIN (PORCINE) 25000 UT/250ML-% IV SOLN
1500.0000 [IU]/h | INTRAVENOUS | Status: DC
  Administered 2023-05-18: 1350 [IU]/h via INTRAVENOUS
  Filled 2023-05-18: qty 250

## 2023-05-18 MED ORDER — VANCOMYCIN HCL 1250 MG/250ML IV SOLN
1250.0000 mg | INTRAVENOUS | Status: DC
  Administered 2023-05-18 – 2023-05-20 (×3): 1250 mg via INTRAVENOUS
  Filled 2023-05-18 (×5): qty 250

## 2023-05-18 MED ORDER — NITROGLYCERIN 0.4 MG SL SUBL: 0.4000 mg | SUBLINGUAL_TABLET | SUBLINGUAL | Status: DC | PRN

## 2023-05-18 MED ORDER — ACETAMINOPHEN 325 MG PO TABS: 650.0000 mg | ORAL_TABLET | Freq: Four times a day (QID) | ORAL | Status: DC | PRN

## 2023-05-18 MED ORDER — ALBUTEROL SULFATE (2.5 MG/3ML) 0.083% IN NEBU: 3.0000 mL | INHALATION_SOLUTION | RESPIRATORY_TRACT | Status: DC | PRN

## 2023-05-18 MED ORDER — ONDANSETRON HCL 4 MG/2ML IJ SOLN: 4.0000 mg | Freq: Four times a day (QID) | INTRAMUSCULAR | Status: DC | PRN

## 2023-05-18 MED ORDER — VANCOMYCIN HCL IN DEXTROSE 1-5 GM/200ML-% IV SOLN: 1000.0000 mg | Freq: Once | INTRAVENOUS | Status: DC

## 2023-05-18 MED ORDER — ASPIRIN 81 MG PO TBEC
81.0000 mg | DELAYED_RELEASE_TABLET | Freq: Every day | ORAL | Status: DC
  Administered 2023-05-19 – 2023-05-21 (×3): 81 mg via ORAL
  Filled 2023-05-18 (×4): qty 1

## 2023-05-18 MED ORDER — HEPARIN (PORCINE) 25000 UT/250ML-% IV SOLN
INTRAVENOUS | Status: AC
  Administered 2023-05-18: 4000 [IU] via INTRAVENOUS
  Filled 2023-05-18: qty 250

## 2023-05-18 MED ORDER — PREDNISONE 20 MG PO TABS
50.0000 mg | ORAL_TABLET | Freq: Four times a day (QID) | ORAL | Status: AC
  Administered 2023-05-18 – 2023-05-19 (×3): 50 mg via ORAL
  Filled 2023-05-18 (×3): qty 1

## 2023-05-18 MED ORDER — NITROGLYCERIN IN D5W 200-5 MCG/ML-% IV SOLN
0.0000 ug/min | INTRAVENOUS | Status: DC
  Administered 2023-05-18: 5 ug/min via INTRAVENOUS
  Filled 2023-05-18: qty 250

## 2023-05-18 MED ORDER — INSULIN ASPART 100 UNIT/ML IJ SOLN
0.0000 [IU] | Freq: Every day | INTRAMUSCULAR | Status: DC
  Administered 2023-05-18: 5 [IU] via SUBCUTANEOUS

## 2023-05-18 NOTE — Progress Notes (Signed)
Pharmacy Antibiotic Note  Kathleen Marks is a 52 y.o. female admitted on 05/17/2023 with cellulitis.  Pharmacy has been consulted for Vancomycin dosing. WBC is elevated. Renal function ok. Facial cellulitis.   Plan: Vancomycin 1250 mg IV q24h >>>Estimated AUC: 479 Trend WBC, temp, renal function  F/U infectious work-up Drug levels as indicated   Height: 5\' 3"  (160 cm) Weight: 90.7 kg (200 lb) IBW/kg (Calculated) : 52.4  Temp (24hrs), Avg:98.6 F (37 C), Min:98 F (36.7 C), Max:99.2 F (37.3 C)  Recent Labs  Lab 05/15/23 2058 05/16/23 0622 05/17/23 1954 05/18/23 0236  WBC 11.7* 13.4* 18.8* 18.1*  CREATININE 0.81 0.78 0.87  --     Estimated Creatinine Clearance: 82.7 mL/min (by C-G formula based on SCr of 0.87 mg/dL).    Allergies  Allergen Reactions   Iodinated Contrast Media Shortness Of Breath and Anaphylaxis   Penicillins Hives   Bupropion Itching    Other Reaction(s): Other  Pt states she has psychological reaction to generic Wellbutrin   Pt states she has psychological reaction to generic Wellbutrin   Clindamycin/Lincomycin Nausea And Vomiting   Tetanus Toxoids Other (See Comments) and Itching    Pain/knots in arms  Other Reaction(s): Other (See Comments)   Azithromycin Nausea And Vomiting and Nausea Only   Blue Dyes (Parenteral) Rash    Abran Duke, PharmD, BCPS Clinical Pharmacist Phone: [phone]

## 2023-05-18 NOTE — Progress Notes (Addendum)
1915 patient alert x4 able to make all needs known washing self up in bath room ambulates in room  2000 prn pain medication given steroids given as ordered 2125 GLUCOSE 403 new orders for insulin  05/19/23 0215 called CT waiting til 0344 to give last dosage of steroids and benadryl for patient to have CT first dosage was given late and not re timed so times are not accurate

## 2023-05-18 NOTE — H&P (Signed)
History and Physical    Kathleen Marks WUJ:[id number] DOB: 07/12/1971 DOA: 05/17/2023  PCP: Glenna Durand, PA-C   Patient coming from: Home   Chief Complaint: Facial pain and swelling, chest pain   HPI: Kathleen Marks is a pleasant 52 y.o. female with medical history significant for poorly controlled diabetes mellitus, asthma, hyperlipidemia, CAD with stent, and recent admission with suspected acute MI who left AMA and now returns complaining of left lower facial swelling with severe pain.  Patient was admitted to the hospital on 05/15/2023 with chest pain, initially suspected to be in rapid atrial fibrillation but cardiology felt this was more likely sinus tachycardia.  She had elevated troponins and echocardiogram with EF 30-35% (64% on Myoview in April 2024) and severe hypokinesis involving mid apical anterioseptal and anterior wall.  She was planned for cardiac catheterization but had to leave to care for one of her children and left AMA on 05/16/2023.   She reports having a "pimple" on her face during the recent hospitalization and has since developed increasing swelling with severe pain at that site.  She denies any fevers or chills.  She continues to have some chest discomfort intermittently but not as severe as at the time of her recent presentation.  ED Course: Upon arrival to the ED, patient is found to be afebrile and saturating well on room air with mild tachycardia and elevated blood pressure.  EKG demonstrates sinus tachycardia with rate 107.  Chest x-ray is negative acute cardiopulmonary disease.  Troponin was 573 and then 620.  Chemistry panel notable for glucose 394.  CBC features a leukocytosis to 18,800.  Patient was given 1 g IV Rocephin and started on IV heparin and IV nitroglycerin infusions in the ED.  Review of Systems:  All other systems reviewed and apart from HPI, are negative.  Past Medical History:  Diagnosis Date   Asthma    Diabetes mellitus  without complication (HCC)    Hyperlipemia     Past Surgical History:  Procedure Laterality Date   ARTHROSCOPIC REPAIR ACL     FEMUR SURGERY      Social History:   reports that she has been smoking cigarettes. She does not have any smokeless tobacco history on file. She reports that she does not drink alcohol and does not use drugs.  Allergies  Allergen Reactions   Iodinated Contrast Media Shortness Of Breath and Anaphylaxis   Penicillins Hives   Bupropion Itching    Other Reaction(s): Other  Pt states she has psychological reaction to generic Wellbutrin   Pt states she has psychological reaction to generic Wellbutrin   Clindamycin/Lincomycin Nausea And Vomiting   Tetanus Toxoids Other (See Comments) and Itching    Pain/knots in arms  Other Reaction(s): Other (See Comments)   Azithromycin Nausea And Vomiting and Nausea Only   Blue Dyes (Parenteral) Rash    History reviewed. No pertinent family history.   Prior to Admission medications   Medication Sig Start Date End Date Taking? Authorizing Provider  acetaminophen (TYLENOL) 500 MG tablet Take 500-1,000 mg by mouth every 6 (six) hours as needed.    [provider]  albuterol (PROVENTIL HFA;VENTOLIN HFA) 108 (90 BASE) MCG/ACT inhaler Inhale 2 puffs into the lungs every 4 (four) hours as needed for wheezing or shortness of breath. 05/21/14   Piepenbrink, Victorino Dike, PA-C  diphenhydrAMINE (BENADRYL) 25 MG tablet Take 25 mg by mouth every 6 (six) hours as needed for itching.    [provider]  fluticasone-salmeterol (ADVAIR HFA) 115-21 MCG/ACT inhaler Inhale 2 puffs into the lungs 2 (two) times daily. 01/26/23   [provider]  guaiFENesin (MUCINEX) 600 MG 12 hr tablet Take 600 mg by mouth 2 (two) times daily as needed (cold symptoms). For cold symptoms Patient not taking: Reported on 05/16/2023    [provider]  guaifenesin (ROBITUSSIN) 100 MG/5ML syrup Take 200 mg by mouth 3 (three) times  daily as needed for cough. Patient not taking: Reported on 05/16/2023    [provider]  guaiFENesin-codeine 100-10 MG/5ML syrup Take 10 mLs by mouth every 6 (six) hours as needed for cough. Patient not taking: Reported on 05/16/2023 05/21/14   Piepenbrink, Victorino Dike, PA-C  ibuprofen (ADVIL,MOTRIN) 200 MG tablet Take 800-1,200 mg by mouth 2 (two) times daily as needed for moderate pain (pain).     [provider]  metFORMIN (GLUCOPHAGE) 500 MG tablet Take 500 mg by mouth 2 (two) times daily with a meal. Patient not taking: Reported on 05/16/2023    [provider]  predniSONE (DELTASONE) 20 MG tablet Take 2 tablets (40 mg total) by mouth daily. Patient not taking: Reported on 10/25/2014 05/21/14   Piepenbrink, Victorino Dike, PA-C  pseudoephedrine-guaifenesin (TUSSIN PE) 30-100 MG/5ML SYRP Take 5 mLs by mouth every 4 (four) hours as needed (cough). Patient not taking: Reported on 05/16/2023    [provider]  tobramycin-dexamethasone [facility]) ophthalmic solution Place 1 drop into both eyes 4 (four) times daily. Patient not taking: Reported on 05/16/2023    [provider]    Physical Exam: Vitals:   05/17/23 1934 05/17/23 1946 05/17/23 2357  BP: (!) 149/92  135/81  Pulse: (!) 118  (!) 112  Resp: 18  18  Temp: 98 F (36.7 C)  99.2 F (37.3 C)  TempSrc: Oral  Oral  SpO2: 96%  99%  Weight:  90.7 kg   Height:  5\' 3"  (1.6 m)     Constitutional: NAD, no pallor or diaphoresis   Eyes: PERTLA, lids and conjunctivae normal ENMT: Mucous membranes are moist. Posterior pharynx clear of any exudate or lesions.   Neck: supple, no masses  Respiratory: no wheezing, no crackles. No accessory muscle use.  Cardiovascular: S1 & S2 heard, regular rate and rhythm. No JVD. Abdomen: No distension, no tenderness, soft. Bowel sounds active.  Musculoskeletal: no clubbing / cyanosis. No joint deformity upper and lower extremities.   Skin: Crusted lesion over mandible on left  with surrounding erythema, heat, tenderness, and swelling. Warm, dry, well-perfused. Neurologic: CN 2-12 grossly intact. Moving all extremities. Alert and oriented.  Psychiatric: Calm. Cooperative.    Labs and Imaging on Admission: I have personally reviewed following labs and imaging studies  CBC: Recent Labs  Lab 05/15/23 2058 05/16/23 0622 05/17/23 1954  WBC 11.7* 13.4* 18.8*  HGB 15.8* 15.2* 14.7  HCT 45.2 44.3 43.1  MCV 82.6 83.7 83.9  PLT 280 255 273   Basic Metabolic Panel: Recent Labs  Lab 05/15/23 2058 05/16/23 0622 05/17/23 1954  NA 130* 132* 134*  K 3.6 4.7 3.7  CL 96* 101 97*  CO2 21* 20* 21*  GLUCOSE 357* 485* 384*  BUN 9 11 16   CREATININE 0.81 0.78 0.87  CALCIUM 9.0 8.6* 9.1   GFR: Estimated Creatinine Clearance: 82.7 mL/min (by C-G formula based on SCr of 0.87 mg/dL). Liver Function Tests: No results for input(s): "AST", "ALT", "ALKPHOS", "BILITOT", "PROT", "ALBUMIN" in the last 168 hours. No results for input(s): "LIPASE", "AMYLASE" in the last 168 hours.  No results for input(s): "AMMONIA" in the last 168 hours. Coagulation Profile: No results for input(s): "INR", "PROTIME" in the last 168 hours. Cardiac Enzymes: No results for input(s): "CKTOTAL", "CKMB", "CKMBINDEX", "TROPONINI" in the last 168 hours. BNP (last 3 results) No results for input(s): "PROBNP" in the last 8760 hours. HbA1C: Recent Labs    05/16/23 0622  HGBA1C 11.2*   CBG: Recent Labs  Lab 05/16/23 0408 05/16/23 0824  GLUCAP 434* 512*   Lipid Profile: Recent Labs    05/16/23 0622  CHOL 273*  HDL 32*  LDLCALC UNABLE TO CALCULATE IF TRIGLYCERIDE OVER 400 mg/dL  TRIG 161*  CHOLHDL 8.5  LDLDIRECT 165*   Thyroid Function Tests: Recent Labs    05/16/23 0622  TSH 0.774   Anemia Panel: No results for input(s): "VITAMINB12", "FOLATE", "FERRITIN", "TIBC", "IRON", "RETICCTPCT" in the last 72 hours. Urine analysis: No results found for: "COLORURINE", "APPEARANCEUR",  "LABSPEC", "PHURINE", "GLUCOSEU", "HGBUR", "BILIRUBINUR", "KETONESUR", "PROTEINUR", "UROBILINOGEN", "NITRITE", "LEUKOCYTESUR" Sepsis Labs: @LABRCNTIP (procalcitonin:4,lacticidven:4) ) Recent Results (from the past 240 hour(s))  MRSA Next Gen by PCR, Nasal     Status: Abnormal   Collection Time: 05/16/23 12:28 AM   Specimen: Nasal Mucosa; Nasal Swab  Result Value Ref Range Status   MRSA by PCR Next Gen DETECTED (A) NOT DETECTED Final    Comment: RESULT CALLED TO, READ BACK BY AND VERIFIED WITH: A COOPER,RN@0231  05/16/23 MK (NOTE) The GeneXpert MRSA Assay (FDA approved for NASAL specimens only), is one component of a comprehensive MRSA colonization surveillance program. It is not intended to diagnose MRSA infection nor to guide or monitor treatment for MRSA infections. Test performance is not FDA approved in patients less than 103 years old. Performed at [facility], [address], [place], [place] [address]      Radiological Exams on Admission: DG Chest 2 View  Result Date: 05/18/2023 CLINICAL DATA:  Atrial fibrillation and chest pain. EXAM: CHEST - 2 VIEW COMPARISON:  Portable chest 05/15/2023 FINDINGS: The heart size and mediastinal contours are within normal limits. A stent is again noted in the circumflex coronary artery. Both lungs are clear. The visualized skeletal structures are intact, with thoracic spondylosis and slight thoracic dextroscoliosis. IMPRESSION: 1. No evidence of acute chest disease or changes. 2. Circumflex coronary artery stent. Electronically Signed   By: Almira Bar M.D.   On: 05/18/2023 00:53   ECHOCARDIOGRAM COMPLETE  Result Date: 05/16/2023    ECHOCARDIOGRAM REPORT   Patient Name:   MARVINE ENCALADE Date of Exam: 05/16/2023 Medical Rec #:  [id number]         Height:       63.0 in Accession #:    [id number]        Weight:       197.3 lb Date of Birth:  1971-01-08         BSA:          1.923 m Patient Age:    52 years          BP:           119/88 mmHg  Patient Gender: F                 HR:           89 bpm. Exam Location:  Inpatient Procedure: 2D Echo, Cardiac Doppler, Color Doppler and Intracardiac            Opacification Agent Indications:    Arrhythmia  History:  Patient has no prior history of Echocardiogram examinations.                 Previous Myocardial Infarction, Arrythmias:Atrial Fibrillation;                 Risk Factors:Diabetes.  Sonographer:    Darlys Gales Referring Phys: [id number] VASUNDHRA RATHORE IMPRESSIONS  1. There is no left ventricular thrombus (Definity contrast was used), but there is apical swirling/slow flow. Left ventricular ejection fraction, by estimation, is 30 to 35%. The left ventricle has moderately decreased function. The left ventricle demonstrates regional wall motion abnormalities (see scoring diagram/findings for description). Left ventricular diastolic parameters are consistent with Grade I diastolic dysfunction (impaired relaxation). There is mild dyskinesis of the left ventricular, apical anteroseptal wall and anterior wall. There is severe hypokinesis of the left ventricular, mid-apical anteroseptal wall and anterior wall.  2. Right ventricular systolic function is normal. The right ventricular size is normal. Tricuspid regurgitation signal is inadequate for assessing PA pressure.  3. The mitral valve is normal in structure. No evidence of mitral valve regurgitation.  4. The aortic valve is tricuspid. Aortic valve regurgitation is not visualized.  5. The inferior vena cava is normal in size with greater than 50% respiratory variability, suggesting right atrial pressure of 3 mmHg. FINDINGS  Left Ventricle: There is no left ventricular thrombus (Definity contrast was used), but there is apical swirling/slow flow. Left ventricular ejection fraction, by estimation, is 30 to 35%. The left ventricle has moderately decreased function. The left ventricle demonstrates regional wall motion abnormalities. Mild dyskinesis of  the left ventricular, apical anteroseptal wall and anterior wall. Severe hypokinesis of the left ventricular, mid-apical anteroseptal wall and anterior wall. Definity contrast agent was given IV to delineate the left ventricular endocardial borders. The left ventricular internal cavity size was normal in size. There is no left ventricular hypertrophy. Left ventricular diastolic parameters are consistent with Grade I diastolic dysfunction (impaired relaxation). Normal left ventricular filling pressure.  LV Wall Scoring: The apical septal segment, apical anterior segment, and apex are dyskinetic. The mid anteroseptal segment, apical lateral segment, mid anterior segment, and apical inferior segment are hypokinetic. The antero-lateral wall, inferior wall, posterior wall, basal anteroseptal segment, mid inferoseptal segment, basal anterior segment, and basal inferoseptal segment are normal. Right Ventricle: The right ventricular size is normal. No increase in right ventricular wall thickness. Right ventricular systolic function is normal. Tricuspid regurgitation signal is inadequate for assessing PA pressure. Left Atrium: Left atrial size was normal in size. Right Atrium: Right atrial size was normal in size. Pericardium: There is no evidence of pericardial effusion. Mitral Valve: The mitral valve is normal in structure. No evidence of mitral valve regurgitation. Tricuspid Valve: The tricuspid valve is normal in structure. Tricuspid valve regurgitation is not demonstrated. Aortic Valve: The aortic valve is tricuspid. Aortic valve regurgitation is not visualized. Aortic valve mean gradient measures 4.0 mmHg. Aortic valve peak gradient measures 6.0 mmHg. Aortic valve area, by VTI measures 2.28 cm. Pulmonic Valve: The pulmonic valve was not well visualized. Pulmonic valve regurgitation is not visualized. No evidence of pulmonic stenosis. Aorta: The aortic root is normal in size and structure. Venous: The inferior vena  cava is normal in size with greater than 50% respiratory variability, suggesting right atrial pressure of 3 mmHg. IAS/Shunts: No atrial level shunt detected by color flow Doppler.  LEFT VENTRICLE PLAX 2D LVIDd:         4.80 cm LVIDs:  3.60 cm LV PW:         0.90 cm LV IVS:        1.20 cm LVOT diam:     1.80 cm LV SV:         61 LV SV Index:   32 LVOT Area:     2.54 cm  LEFT ATRIUM             Index        RIGHT ATRIUM          Index LA Vol (A2C):   26.0 ml 13.52 ml/m  RA Area:     9.85 cm LA Vol (A4C):   50.8 ml 26.42 ml/m  RA Volume:   21.30 ml 11.08 ml/m LA Biplane Vol: 36.9 ml 19.19 ml/m  AORTIC VALVE AV Area (Vmax):    2.19 cm AV Area (Vmean):   2.09 cm AV Area (VTI):     2.28 cm AV Vmax:           122.00 cm/s AV Vmean:          96.100 cm/s AV VTI:            0.266 m AV Peak Grad:      6.0 mmHg AV Mean Grad:      4.0 mmHg LVOT Vmax:         105.00 cm/s LVOT Vmean:        79.000 cm/s LVOT VTI:          0.238 m LVOT/AV VTI ratio: 0.89  AORTA Ao Root diam: 2.70 cm MITRAL VALVE MV Area (PHT): 5.42 cm     SHUNTS MV Decel Time: 140 msec     Systemic VTI:  0.24 m MV E velocity: 60.80 cm/s   Systemic Diam: 1.80 cm MV A velocity: 116.00 cm/s MV E/A ratio:  0.52 Mihai Croitoru MD Electronically signed by Thurmon Fair MD Signature Date/Time: 05/16/2023/10:00:44 AM    Final     EKG: Independently reviewed. Sinus tachycardia, rate 107.   Assessment/Plan   1. NSTEMI  - Left AMA 7/9 prior to planned LHC, now returns with ongoing intermittent chest pain but less severe than at time of recent admission  - She was started on IV heparin and IV nitroglycerin infusions in ED  - Continue IV heparin, stop IV NTG (was not having chest pain), continue ASA, resume high-intensity statin, resume metoprolol, trend troponin and EKG, consult cardiology    2. Facial cellulitis  - Check [place] for underlying abscess (she reports severe contrast allergy), continue empiric antibiotics  3. Type II DM  - A1c was 11.2%  this month  - Check CBGs and use SSI for now   4. Hyperlipidemia  - High intensity statin   5. Elevated BP  - In setting of suspected NSTEMI, will start metoprolol    6. Asthma  - Not in exacerbation on admission  - Continue ICS-LABA and as needed albuterol    DVT prophylaxis: IV heparin  Code Status: Full  Level of Care: Level of care: Telemetry Cardiac Family Communication: none present  Disposition Plan:  Patient is from: home  Anticipated d/c is to: Home  Anticipated d/c date is: 05/21/23  Patient currently: Pending imaging of face and possible I&D, cardiology consultation  Consults called: Notified cardiology fellow of patient's return and requested AM cardiology consultation   Admission status: Inpatient     Briscoe Deutscher, MD [facility]  05/18/2023, 2:15 AM

## 2023-05-18 NOTE — Consult Note (Signed)
CONSULTATION NOTE   Patient Name: Kathleen Marks Date of Encounter: 05/18/2023 Cardiologist: None Electrophysiologist: None Advanced Heart Failure: None   Chief Complaint   Chest pain, facial swelling  Patient Profile   52 yo female with recent NSTEMI or possible missed-STEMI, found to have LVEF 30-35% with LAD territory WMA, planned for cath, but left AMA 2 days ago, now returns with chest pain and facial swelling/cellulitis  HPI   Kathleen Marks is a 52 y.o. female who is being seen today for the evaluation of chest pain at the request of Dr. Antionette Char. This is a 52 yo female known to me from hospitalization on my consultative service a few days ago - presented with NSTEMI or possible missed STEMI with anterolateral ST elevation that was mild. Concern for possible afib, but it appeared to be a sinus tach. Echo showed newly reduced LVEF to 30 to 35% with LAD territory wall motion abnormality.  Sluggish flow in the apex was noted.  No thrombus was identified.  It was recommended that she underwent cardiac catheterization however she became distraught as she said she had to leave the hospital to take her son to a dental appointment.  She ultimately left AGAINST MEDICAL ADVICE.  She now returns with intermittent chest pain although less severe than it was before.  She also notes left lower facial swelling.  On arrival she was noted to be afebrile but had an elevated white blood cell count of 18,800.  Troponins were elevated at 573 and 620.  She was started on IV heparin and nitroglycerin and given IV Rocephin.  She was admitted to the medicine service for facial cellulitis.  Cardiology is asked to consult again regarding recent MRI, heart failure and ongoing chest pain.  PMHx   Past Medical History:  Diagnosis Date   Asthma    Diabetes mellitus without complication (HCC)    Hyperlipemia     Past Surgical History:  Procedure Laterality Date   ARTHROSCOPIC REPAIR ACL      FEMUR SURGERY      FAMHx   History reviewed. No pertinent family history.  SOCHx    reports that she has been smoking cigarettes. She does not have any smokeless tobacco history on file. She reports that she does not drink alcohol and does not use drugs.  Outpatient Medications   No current facility-administered medications on file prior to encounter.   Current Outpatient Medications on File Prior to Encounter  Medication Sig Dispense Refill   acetaminophen (TYLENOL) 500 MG tablet Take 500-1,000 mg by mouth every 6 (six) hours as needed for moderate pain.     albuterol (PROVENTIL HFA;VENTOLIN HFA) 108 (90 BASE) MCG/ACT inhaler Inhale 2 puffs into the lungs every 4 (four) hours as needed for wheezing or shortness of breath. 1 Inhaler 1   ibuprofen (ADVIL,MOTRIN) 200 MG tablet Take 800-1,200 mg by mouth 2 (two) times daily as needed for moderate pain (pain).       Inpatient Medications    Scheduled Meds:  [START ON 05/19/2023] aspirin EC  81 mg Oral Daily   atorvastatin  80 mg Oral Daily   insulin aspart  0-20 Units Subcutaneous TID WC   insulin glargine-yfgn  20 Units Subcutaneous Daily   metoprolol tartrate  25 mg Oral BID   mometasone-formoterol  2 puff Inhalation BID    Continuous Infusions:  heparin 1,500 Units/hr (05/18/23 0840)   vancomycin      PRN Meds: acetaminophen, albuterol, nitroGLYCERIN, ondansetron (ZOFRAN)  IV, oxyCODONE   ALLERGIES   Allergies  Allergen Reactions   Iodinated Contrast Media Shortness Of Breath and Anaphylaxis   Penicillins Hives   Bupropion Itching    Pt states she has psychological reaction to generic Wellbutrin   Clindamycin/Lincomycin Nausea And Vomiting   Tetanus Toxoids Itching and Other (See Comments)    Pain/knots in arms    Azithromycin Nausea And Vomiting and Nausea Only    States she can tolerate.    Blue Dyes (Parenteral) Rash    ROS   Pertinent items noted in HPI and remainder of comprehensive ROS otherwise  negative.  Vitals   Vitals:   05/18/23 0400 05/18/23 0406 05/18/23 0500 05/18/23 0600  BP: 99/64  (!) 93/50 (!) 94/54  Pulse: 93  98 (!) 102  Resp: 17  (!) 22 19  Temp:  98 F (36.7 C)    TempSrc:      SpO2: 100%  97% 97%  Weight:      Height:        Intake/Output Summary (Last 24 hours) at 05/18/2023 0843 Last data filed at 05/18/2023 0515 Gross per 24 hour  Intake 301.63 ml  Output --  Net 301.63 ml   Filed Weights   05/17/23 1946  Weight: 90.7 kg    Physical Exam   General appearance: alert and no distress Neck: no carotid bruit, no JVD, thyroid not enlarged, symmetric, no tenderness/mass/nodules, and left facial swelling over the angle of the jaw with fluctuance and lesion of the left cheek, erythematous and warm Lungs: clear to auscultation bilaterally Heart: regular tachycardia Abdomen: soft, non-tender; bowel sounds normal; no masses,  no organomegaly Extremities: extremities normal, atraumatic, no cyanosis or edema Pulses: 2+ and symmetric Skin: Skin color, texture, turgor normal. No rashes or lesions Neurologic: Grossly normal Psych: Pleasant  Labs   Results for orders placed or performed during the hospital encounter of 05/17/23 (from the past 48 hour(s))  Basic metabolic panel     Status: Abnormal   Collection Time: 05/17/23  7:54 PM  Result Value Ref Range   Sodium 134 (L) 135 - 145 mmol/L   Potassium 3.7 3.5 - 5.1 mmol/L   Chloride 97 (L) 98 - 111 mmol/L   CO2 21 (L) 22 - 32 mmol/L   Glucose, Bld 384 (H) 70 - 99 mg/dL    Comment: Glucose reference range applies only to samples taken after fasting for at least 8 hours.   BUN 16 6 - 20 mg/dL   Creatinine, Ser 8.29 0.44 - 1.00 mg/dL   Calcium 9.1 8.9 - 56.2 mg/dL   GFR, Estimated >13 >08 mL/min    Comment: (NOTE) Calculated using the CKD-EPI Creatinine Equation (2021)    Anion gap 16 (H) 5 - 15    Comment: Performed at [facility], [address], [place], [place] [address]  CBC      Status: Abnormal   Collection Time: 05/17/23  7:54 PM  Result Value Ref Range   WBC 18.8 (H) 4.0 - 10.5 K/uL   RBC 5.14 (H) 3.87 - 5.11 MIL/uL   Hemoglobin 14.7 12.0 - 15.0 g/dL   HCT 69.6 29.5 - 28.4 %   MCV 83.9 80.0 - 100.0 fL   MCH 28.6 26.0 - 34.0 pg   MCHC 34.1 30.0 - 36.0 g/dL   RDW 13.2 44.0 - 10.2 %   Platelets 273 150 - 400 K/uL   nRBC 0.0 0.0 - 0.2 %    Comment: Performed at  [facility], [address] [place], [place] [address]  Troponin I (High Sensitivity)     Status: Abnormal   Collection Time: 05/17/23  7:54 PM  Result Value Ref Range   Troponin I (High Sensitivity) 573 (HH) <18 ng/L    Comment: CRITICAL VALUE NOTED. VALUE IS CONSISTENT WITH PREVIOUSLY REPORTED/CALLED VALUE (NOTE) Elevated high sensitivity troponin I (hsTnI) values and significant  changes across serial measurements may suggest ACS but many other  chronic and acute conditions are known to elevate hsTnI results.  Refer to the "Links" section for chest pain algorithms and additional  guidance. Performed at [facility], [address] [place], [place] [address]   hCG, serum, qualitative     Status: None   Collection Time: 05/17/23  7:54 PM  Result Value Ref Range   Preg, Serum NEGATIVE NEGATIVE    Comment:        THE SENSITIVITY OF THIS METHODOLOGY IS >10 mIU/mL. Performed at [facility], [address] [place], [place] [address]   Troponin I (High Sensitivity)     Status: Abnormal   Collection Time: 05/17/23  9:46 PM  Result Value Ref Range   Troponin I (High Sensitivity) 620 (HH) <18 ng/L    Comment: CRITICAL VALUE NOTED. VALUE IS CONSISTENT WITH PREVIOUSLY REPORTED/CALLED VALUE (NOTE) Elevated high sensitivity troponin I (hsTnI) values and significant  changes across serial measurements may suggest ACS but many other  chronic and acute conditions are known to elevate hsTnI results.  Refer to the "Links" section for chest pain algorithms and additional   guidance. Performed at [facility], [address] [place], [place] [address]   Basic metabolic panel     Status: Abnormal   Collection Time: 05/18/23  2:36 AM  Result Value Ref Range   Sodium 132 (L) 135 - 145 mmol/L   Potassium 3.5 3.5 - 5.1 mmol/L   Chloride 101 98 - 111 mmol/L   CO2 20 (L) 22 - 32 mmol/L   Glucose, Bld 300 (H) 70 - 99 mg/dL    Comment: Glucose reference range applies only to samples taken after fasting for at least 8 hours.   BUN 10 6 - 20 mg/dL   Creatinine, Ser 2.95 0.44 - 1.00 mg/dL   Calcium 8.6 (L) 8.9 - 10.3 mg/dL   GFR, Estimated >62 >13 mL/min    Comment: (NOTE) Calculated using the CKD-EPI Creatinine Equation (2021)    Anion gap 11 5 - 15    Comment: Performed at [facility], [address] [place], [place] [address]  Magnesium     Status: Abnormal   Collection Time: 05/18/23  2:36 AM  Result Value Ref Range   Magnesium 1.6 (L) 1.7 - 2.4 mg/dL    Comment: Performed at [facility], [address] [place], [place] [address]  CBC     Status: Abnormal   Collection Time: 05/18/23  2:36 AM  Result Value Ref Range   WBC 18.1 (H) 4.0 - 10.5 K/uL   RBC 5.32 (H) 3.87 - 5.11 MIL/uL   Hemoglobin 15.1 (H) 12.0 - 15.0 g/dL   HCT 29.5 28.4 - 13.2 %   MCV 82.9 80.0 - 100.0 fL   MCH 28.4 26.0 - 34.0 pg   MCHC 34.2 30.0 - 36.0 g/dL   RDW 44.0 10.2 - 72.5 %   Platelets 282 150 - 400 K/uL   nRBC 0.0 0.0 - 0.2 %  Comment: Performed at [facility], [address], [place], [place] [address]  Troponin I (High Sensitivity)     Status: Abnormal   Collection Time: 05/18/23  4:50 AM  Result Value Ref Range   Troponin I (High Sensitivity) 585 (HH) <18 ng/L    Comment: CRITICAL VALUE NOTED. VALUE IS CONSISTENT WITH PREVIOUSLY REPORTED/CALLED VALUE (NOTE) Elevated high sensitivity troponin I (hsTnI) values and significant  changes across serial measurements may suggest ACS but many other  chronic and acute conditions are known to  elevate hsTnI results.  Refer to the "Links" section for chest pain algorithms and additional  guidance. Performed at [facility], [address], [place], [place] [address]   CBG monitoring, ED     Status: Abnormal   Collection Time: 05/18/23  5:00 AM  Result Value Ref Range   Glucose-Capillary 238 (H) 70 - 99 mg/dL    Comment: Glucose reference range applies only to samples taken after fasting for at least 8 hours.  CBG monitoring, ED     Status: Abnormal   Collection Time: 05/18/23  7:59 AM  Result Value Ref Range   Glucose-Capillary 226 (H) 70 - 99 mg/dL    Comment: Glucose reference range applies only to samples taken after fasting for at least 8 hours.  Heparin level (unfractionated)     Status: Abnormal   Collection Time: 05/18/23  8:00 AM  Result Value Ref Range   Heparin Unfractionated 0.24 (L) 0.30 - 0.70 IU/mL    Comment: (NOTE) The clinical reportable range upper limit is being lowered to >1.10 to align with the FDA approved guidance for the current laboratory assay.  If heparin results are below expected values, and patient dosage has  been confirmed, suggest follow up testing of antithrombin III levels. Performed at [facility], [address], [place], [place] [address]     ECG   Sinus tachycardia at 107- Personally Reviewed  Telemetry   Sinus tachycardia- Personally Reviewed  Radiology   US SOFT TISSUE HEAD & NECK (NON-THYROID)  Result Date: 05/18/2023 CLINICAL DATA:  Facial cellulitis EXAM: ULTRASOUND OF HEAD/NECK SOFT TISSUES TECHNIQUE: Ultrasound examination of the head and neck soft tissues was performed in the area of clinical concern. COMPARISON:  None Available. FINDINGS: Along the anterior left jaw line, there is an ill-defined 2.2 x 1.3 x 2.3 cm area of somewhat heterogeneous echogenicity, which correlates with the palpable abnormality. No fluid collection or cyst is seen. No internal color flow is noted in this area. IMPRESSION:  Ill-defined 2.2 x 1.3 x 2.3 cm area of somewhat heterogeneous echogenicity along the anterior left jaw line, which correlates with the palpable abnormality. This is nonspecific, but could represent a phlegmon or developing abscess. Correlate with physical exam and consider CT maxillofacial with contrast for further evaluation if clinically indicated. Electronically Signed   By: Wiliam Ke M.D.   On: 05/18/2023 03:43   DG Chest 2 View  Result Date: 05/18/2023 CLINICAL DATA:  Atrial fibrillation and chest pain. EXAM: CHEST - 2 VIEW COMPARISON:  Portable chest 05/15/2023 FINDINGS: The heart size and mediastinal contours are within normal limits. A stent is again noted in the circumflex coronary artery. Both lungs are clear. The visualized skeletal structures are intact, with thoracic spondylosis and slight thoracic dextroscoliosis. IMPRESSION: 1. No evidence of acute chest disease or changes. 2. Circumflex coronary artery stent. Electronically Signed   By: Almira Bar M.D.   On: 05/18/2023 00:53   ECHOCARDIOGRAM  COMPLETE  Result Date: 05/16/2023    ECHOCARDIOGRAM REPORT   Patient Name:   KARRISSA PARCHMENT Date of Exam: 05/16/2023 Medical Rec #:  [id number]         Height:       63.0 in Accession #:    [id number]        Weight:       197.3 lb Date of Birth:  1971-09-01         BSA:          1.923 m Patient Age:    52 years          BP:           119/88 mmHg Patient Gender: F                 HR:           89 bpm. Exam Location:  Inpatient Procedure: 2D Echo, Cardiac Doppler, Color Doppler and Intracardiac            Opacification Agent Indications:    Arrhythmia  History:        Patient has no prior history of Echocardiogram examinations.                 Previous Myocardial Infarction, Arrythmias:Atrial Fibrillation;                 Risk Factors:Diabetes.  Sonographer:    Darlys Gales Referring Phys: [id number] VASUNDHRA RATHORE IMPRESSIONS  1. There is no left ventricular thrombus (Definity contrast was used),  but there is apical swirling/slow flow. Left ventricular ejection fraction, by estimation, is 30 to 35%. The left ventricle has moderately decreased function. The left ventricle demonstrates regional wall motion abnormalities (see scoring diagram/findings for description). Left ventricular diastolic parameters are consistent with Grade I diastolic dysfunction (impaired relaxation). There is mild dyskinesis of the left ventricular, apical anteroseptal wall and anterior wall. There is severe hypokinesis of the left ventricular, mid-apical anteroseptal wall and anterior wall.  2. Right ventricular systolic function is normal. The right ventricular size is normal. Tricuspid regurgitation signal is inadequate for assessing PA pressure.  3. The mitral valve is normal in structure. No evidence of mitral valve regurgitation.  4. The aortic valve is tricuspid. Aortic valve regurgitation is not visualized.  5. The inferior vena cava is normal in size with greater than 50% respiratory variability, suggesting right atrial pressure of 3 mmHg. FINDINGS  Left Ventricle: There is no left ventricular thrombus (Definity contrast was used), but there is apical swirling/slow flow. Left ventricular ejection fraction, by estimation, is 30 to 35%. The left ventricle has moderately decreased function. The left ventricle demonstrates regional wall motion abnormalities. Mild dyskinesis of the left ventricular, apical anteroseptal wall and anterior wall. Severe hypokinesis of the left ventricular, mid-apical anteroseptal wall and anterior wall. Definity contrast agent was given IV to delineate the left ventricular endocardial borders. The left ventricular internal cavity size was normal in size. There is no left ventricular hypertrophy. Left ventricular diastolic parameters are consistent with Grade I diastolic dysfunction (impaired relaxation). Normal left ventricular filling pressure.  LV Wall Scoring: The apical septal segment, apical  anterior segment, and apex are dyskinetic. The mid anteroseptal segment, apical lateral segment, mid anterior segment, and apical inferior segment are hypokinetic. The antero-lateral wall, inferior wall, posterior wall, basal anteroseptal segment, mid inferoseptal segment, basal anterior segment, and basal inferoseptal segment are normal. Right Ventricle: The right ventricular size is normal. No increase in right ventricular wall  thickness. Right ventricular systolic function is normal. Tricuspid regurgitation signal is inadequate for assessing PA pressure. Left Atrium: Left atrial size was normal in size. Right Atrium: Right atrial size was normal in size. Pericardium: There is no evidence of pericardial effusion. Mitral Valve: The mitral valve is normal in structure. No evidence of mitral valve regurgitation. Tricuspid Valve: The tricuspid valve is normal in structure. Tricuspid valve regurgitation is not demonstrated. Aortic Valve: The aortic valve is tricuspid. Aortic valve regurgitation is not visualized. Aortic valve mean gradient measures 4.0 mmHg. Aortic valve peak gradient measures 6.0 mmHg. Aortic valve area, by VTI measures 2.28 cm. Pulmonic Valve: The pulmonic valve was not well visualized. Pulmonic valve regurgitation is not visualized. No evidence of pulmonic stenosis. Aorta: The aortic root is normal in size and structure. Venous: The inferior vena cava is normal in size with greater than 50% respiratory variability, suggesting right atrial pressure of 3 mmHg. IAS/Shunts: No atrial level shunt detected by color flow Doppler.  LEFT VENTRICLE PLAX 2D LVIDd:         4.80 cm LVIDs:         3.60 cm LV PW:         0.90 cm LV IVS:        1.20 cm LVOT diam:     1.80 cm LV SV:         61 LV SV Index:   32 LVOT Area:     2.54 cm  LEFT ATRIUM             Index        RIGHT ATRIUM          Index LA Vol (A2C):   26.0 ml 13.52 ml/m  RA Area:     9.85 cm LA Vol (A4C):   50.8 ml 26.42 ml/m  RA Volume:   21.30 ml  11.08 ml/m LA Biplane Vol: 36.9 ml 19.19 ml/m  AORTIC VALVE AV Area (Vmax):    2.19 cm AV Area (Vmean):   2.09 cm AV Area (VTI):     2.28 cm AV Vmax:           122.00 cm/s AV Vmean:          96.100 cm/s AV VTI:            0.266 m AV Peak Grad:      6.0 mmHg AV Mean Grad:      4.0 mmHg LVOT Vmax:         105.00 cm/s LVOT Vmean:        79.000 cm/s LVOT VTI:          0.238 m LVOT/AV VTI ratio: 0.89  AORTA Ao Root diam: 2.70 cm MITRAL VALVE MV Area (PHT): 5.42 cm     SHUNTS MV Decel Time: 140 msec     Systemic VTI:  0.24 m MV E velocity: 60.80 cm/s   Systemic Diam: 1.80 cm MV A velocity: 116.00 cm/s MV E/A ratio:  0.52 Mihai Croitoru MD Electronically signed by Thurmon Fair MD Signature Date/Time: 05/16/2023/10:00:44 AM    Final     Cardiac Studies   See echo above  Impression   Principal Problem:   Facial cellulitis Active Problems:   CAD S/P percutaneous coronary angioplasty   Type 2 diabetes mellitus (HCC)   Non-ST elevation (NSTEMI) myocardial infarction [facility])   Recommendation   Recent NSTEMI/missed STEMI Patient left AMA from her last admission Ischemic cardiomyopathy, LVEF 30 to 35% Facial cellulitis  Ms. Mcqueary returns with ongoing chest pain having had a recent NSTEMI/missed STEMI and tachycardia which is more consistent with a sinus tachycardia.  Echo showed an LVEF of 30 to 35% with LAD territory wall motion abnormality.  Definitive cardiac catheterization was recommended however she left AMA during her last admission.  She now returns with recurrent chest pain and facial pain with a cellulitis.  She is on antibiotics per the hospital medicine service for that.  She has been started on nitroglycerin and IV heparin.  She will need definitive cardiac catheterization which we can accommodate today - she has been NPO.    CARDIAC CATHETERIZATION CONSENT  The procedure with Risks/Benefits/Alternatives and Indications was reviewed with the patient Gianina Lembo.  All questions  were answered.    Risks / Complications include, but not limited to: Death, MI, CVA/TIA, VF/VT (with defibrillation), Bradycardia (need for temporary pacer placement), contrast induced nephropathy, bleeding / bruising / hematoma / pseudoaneurysm, vascular or coronary injury (with possible emergent CT or Vascular Surgery), adverse medication reactions, infection.    The patient voices understanding and agree to proceed.   I have signed the consent form and placed it on the chart for patient signature and RN witness.      Time Spent Directly with Patient:  I have spent a total of 45 minutes with the patient reviewing hospital notes, telemetry, EKGs, labs and examining the patient as well as establishing an assessment and plan that was discussed personally with the patient.  > 50% of time was spent in direct patient care.  Length of Stay:  LOS: 0 days   [name], MD, [facility], FACP  [place]  [facility]  Medical Director of the Advanced Lipid Disorders &  Cardiovascular Risk Reduction Clinic Diplomate of the American Board of Clinical Lipidology Attending Cardiologist  Direct Dial: [phone]  Fax: [phone]  Website:  [email or web address] 05/18/2023, 8:43 AM

## 2023-05-18 NOTE — Progress Notes (Signed)
ANTICOAGULATION CONSULT NOTE Pharmacy Consult for heparin Indication: chest pain/ACS  Allergies  Allergen Reactions   Iodinated Contrast Media Shortness Of Breath and Anaphylaxis   Penicillins Hives   Bupropion Itching    Pt states she has psychological reaction to generic Wellbutrin   Clindamycin/Lincomycin Nausea And Vomiting   Tetanus Toxoids Itching and Other (See Comments)    Pain/knots in arms    Azithromycin Nausea And Vomiting and Nausea Only    States she can tolerate.    Blue Dyes (Parenteral) Rash    Patient Measurements: Height: 5\' 3"  (160 cm) Weight: 90.7 kg (200 lb) IBW/kg (Calculated) : 52.4 Heparin Dosing Weight: 73 kg  Vital Signs: Temp: 98 F (36.7 C) (07/11 0406) Temp Source: Oral (07/10 2357) BP: 94/54 (07/11 0600) Pulse Rate: 102 (07/11 0600)  Labs: Recent Labs    05/16/23 0622 05/16/23 1021 05/17/23 1954 05/17/23 2146 05/18/23 0236 05/18/23 0450 05/18/23 0800  HGB 15.2*  --  14.7  --  15.1*  --   --   HCT 44.3  --  43.1  --  44.1  --   --   PLT 255  --  273  --  282  --   --   HEPARINUNFRC 0.21*  --   --   --   --   --  0.24*  CREATININE 0.78  --  0.87  --  0.69  --   --   TROPONINIHS  --    < > 573* 620*  --  585*  --    < > = values in this interval not displayed.    Estimated Creatinine Clearance: 89.9 mL/min (by C-G formula based on SCr of 0.69 mg/dL).   Medical History: Past Medical History:  Diagnosis Date   Asthma    Diabetes mellitus without complication (HCC)    Hyperlipemia     Medications:  (Not in a hospital admission)  Scheduled:   [START ON 05/19/2023] aspirin EC  81 mg Oral Daily   atorvastatin  80 mg Oral Daily   insulin aspart  0-20 Units Subcutaneous TID WC   insulin glargine-yfgn  20 Units Subcutaneous Daily   metoprolol tartrate  25 mg Oral BID   mometasone-formoterol  2 puff Inhalation BID   Assessment: 52 yoF previously admitted for c/o left-sided CP radiating to LUE and jaw associated w/ SOB, nausea,  lightheadedness, and dizziness and found to be in afib RVR. Pharmacy consulted to dose heparin for ACS/STEMI. Previously left AMA, but returned for continued care. Patient is not on anticoagulation PTA.  Initial heparin level low at 0.24. CBC stable. No bleeding or issues with infusion per discussion with RN.  Goal of Therapy:  Heparin level 0.3-0.7 units/ml Monitor platelets by anticoagulation protocol: Yes   Plan:  Increase heparin to 1500 units/hr Check 6hr heparin level Monitor daily CBC, s/sx bleeding Follow up cardiology plans   Leia Alf, PharmD, BCPS Please check AMION for all [facility] contact numbers Clinical Pharmacist 05/18/2023 8:33 AM

## 2023-05-18 NOTE — ED Notes (Addendum)
Pt alert, NAD, calm, interactive, resps e/u, speaking in clear complete sentences. Sitting upright on edge of bed. C/o L mandibular abscess getting worse, larger. Admitting MD in to see pt. Pt updated. Heparin increased per pharmacy.

## 2023-05-18 NOTE — Plan of Care (Signed)
  Problem: Education: Goal: Ability to describe self-care measures that may prevent or decrease complications (Diabetes Survival Skills Education) will improve Outcome: Progressing Goal: Individualized Educational Video(s) Outcome: Progressing   Problem: Coping: Goal: Ability to adjust to condition or change in health will improve Outcome: Progressing   Problem: Fluid Volume: Goal: Ability to maintain a balanced intake and output will improve Outcome: Progressing   Problem: Health Behavior/Discharge Planning: Goal: Ability to identify and utilize available resources and services will improve Outcome: Progressing Goal: Ability to manage health-related needs will improve Outcome: Progressing   Problem: Metabolic: Goal: Ability to maintain appropriate glucose levels will improve Outcome: Progressing   Problem: Nutritional: Goal: Maintenance of adequate nutrition will improve Outcome: Progressing Goal: Progress toward achieving an optimal weight will improve Outcome: Progressing   Problem: Skin Integrity: Goal: Risk for impaired skin integrity will decrease Outcome: Progressing   Problem: Tissue Perfusion: Goal: Adequacy of tissue perfusion will improve Outcome: Progressing   Problem: Education: Goal: Understanding of cardiac disease, CV risk reduction, and recovery process will improve Outcome: Progressing Goal: Individualized Educational Video(s) Outcome: Progressing   Problem: Activity: Goal: Ability to tolerate increased activity will improve Outcome: Progressing   Problem: Cardiac: Goal: Ability to achieve and maintain adequate cardiovascular perfusion will improve Outcome: Progressing   Problem: Health Behavior/Discharge Planning: Goal: Ability to safely manage health-related needs after discharge will improve Outcome: Progressing   Problem: Education: Goal: Understanding of CV disease, CV risk reduction, and recovery process will improve Outcome:  Progressing Goal: Individualized Educational Video(s) Outcome: Progressing   Problem: Activity: Goal: Ability to return to baseline activity level will improve Outcome: Progressing   Problem: Cardiovascular: Goal: Ability to achieve and maintain adequate cardiovascular perfusion will improve Outcome: Progressing Goal: Vascular access site(s) Level 0-1 will be maintained Outcome: Progressing   Problem: Health Behavior/Discharge Planning: Goal: Ability to safely manage health-related needs after discharge will improve Outcome: Progressing   Problem: Education: Goal: Knowledge of General Education information will improve Description: Including pain rating scale, medication(s)/side effects and non-pharmacologic comfort measures Outcome: Progressing   Problem: Health Behavior/Discharge Planning: Goal: Ability to manage health-related needs will improve Outcome: Progressing   Problem: Clinical Measurements: Goal: Ability to maintain clinical measurements within normal limits will improve Outcome: Progressing Goal: Will remain free from infection Outcome: Progressing Goal: Diagnostic test results will improve Outcome: Progressing Goal: Respiratory complications will improve Outcome: Progressing Goal: Cardiovascular complication will be avoided Outcome: Progressing   Problem: Activity: Goal: Risk for activity intolerance will decrease Outcome: Progressing   Problem: Nutrition: Goal: Adequate nutrition will be maintained Outcome: Progressing   Problem: Coping: Goal: Level of anxiety will decrease Outcome: Progressing   Problem: Elimination: Goal: Will not experience complications related to bowel motility Outcome: Progressing Goal: Will not experience complications related to urinary retention Outcome: Progressing   Problem: Pain Managment: Goal: General experience of comfort will improve Outcome: Progressing   Problem: Safety: Goal: Ability to remain free from  injury will improve Outcome: Progressing   Problem: Skin Integrity: Goal: Risk for impaired skin integrity will decrease Outcome: Progressing   

## 2023-05-18 NOTE — Progress Notes (Signed)
ANTICOAGULATION CONSULT NOTE Pharmacy Consult for heparin Indication: chest pain/ACS  Allergies  Allergen Reactions   Iodinated Contrast Media Shortness Of Breath and Anaphylaxis   Penicillins Hives   Bupropion Itching    Pt states she has psychological reaction to generic Wellbutrin   Clindamycin/Lincomycin Nausea And Vomiting   Tetanus Toxoids Itching and Other (See Comments)    Pain/knots in arms    Azithromycin Nausea And Vomiting and Nausea Only    States she can tolerate.    Blue Dyes (Parenteral) Rash    Patient Measurements: Height: 5\' 3"  (160 cm) Weight: 88.3 kg (194 lb 11.2 oz) IBW/kg (Calculated) : 52.4 Heparin Dosing Weight: 73 kg  Vital Signs: Temp: 98 F (36.7 C) (07/11 1511) Temp Source: Oral (07/11 1511) BP: 114/64 (07/11 1511) Pulse Rate: 91 (07/11 1511)  Labs: Recent Labs    05/16/23 0622 05/16/23 1021 05/17/23 1954 05/17/23 2146 05/18/23 0236 05/18/23 0450 05/18/23 0800 05/18/23 1551  HGB 15.2*  --  14.7  --  15.1*  --   --   --   HCT 44.3  --  43.1  --  44.1  --   --   --   PLT 255  --  273  --  282  --   --   --   HEPARINUNFRC 0.21*  --   --   --   --   --  0.24* 0.10*  CREATININE 0.78  --  0.87  --  0.69  --   --   --   TROPONINIHS  --    < > 573* 620*  --  585*  --   --    < > = values in this interval not displayed.    Estimated Creatinine Clearance: 88.7 mL/min (by C-G formula based on SCr of 0.69 mg/dL).   Medical History: Past Medical History:  Diagnosis Date   Asthma    Diabetes mellitus without complication (HCC)    Hyperlipemia     Medications:  Medications Prior to Admission  Medication Sig Dispense Refill Last Dose   acetaminophen (TYLENOL) 500 MG tablet Take 500-1,000 mg by mouth every 6 (six) hours as needed for moderate pain.   Past Month   albuterol (PROVENTIL HFA;VENTOLIN HFA) 108 (90 BASE) MCG/ACT inhaler Inhale 2 puffs into the lungs every 4 (four) hours as needed for wheezing or shortness of breath. 1 Inhaler 1  05/17/2023   ibuprofen (ADVIL,MOTRIN) 200 MG tablet Take 800-1,200 mg by mouth 2 (two) times daily as needed for moderate pain (pain).    05/17/2023   Scheduled:   [START ON 05/19/2023] aspirin EC  81 mg Oral Daily   atorvastatin  80 mg Oral Daily   [START ON 05/19/2023] diphenhydrAMINE  50 mg Oral Once   insulin aspart  0-20 Units Subcutaneous TID WC   insulin glargine-yfgn  20 Units Subcutaneous Daily   metoprolol tartrate  25 mg Oral BID   mometasone-formoterol  2 puff Inhalation BID   predniSONE  50 mg Oral Q6H   sodium chloride flush  3 mL Intravenous Q12H   Assessment: 52 yoF previously admitted for c/o left-sided CP radiating to LUE and jaw associated w/ SOB, nausea, lightheadedness, and dizziness and found to be in afib RVR. Pharmacy consulted to dose heparin for ACS/STEMI. Previously left AMA, but returned for continued care. Patient is not on anticoagulation PTA.  Heparin level low at 0.10. No bleeding or issues with infusion per discussion with RN. RN does report that she was  given report by ED RN that previous heparin level of 0.24 was drawn from same line that heparin was infusing.  Goal of Therapy:  Heparin level 0.3-0.7 units/ml Monitor platelets by anticoagulation protocol: Yes   Plan:  Heparin bolus 2000 units IV x 1 Increase heparin to 1700 units/hr Check 6hr heparin level Monitor daily CBC, s/sx bleeding Follow up cardiology plans  Loralee Pacas, PharmD, BCPS 05/18/2023 6:35 PM  Please check AMION for all [facility] Pharmacy phone numbers After 10:00 PM, call Main Pharmacy [phone]

## 2023-05-18 NOTE — ED Provider Notes (Signed)
[place] EMERGENCY DEPARTMENT AT [facility] Provider Note   CSN: [id number] Arrival date & time: 05/17/23  1835     History  Chief Complaint  Patient presents with   Follow-up    Paiten Kathleen Marks is a 52 y.o. female.  HPI     This is a 52 year old female with history of migraine, hypertension, diabetes who presents with ongoing chest discomfort.  Was just in the hospital and left AGAINST MEDICAL ADVICE.  She states she has an autistic son and was unable to stay for planned cardiac catheterization.  At that time she had rising troponins.  She also presented with atrial fibrillation.  Since leaving, she states she has had intermittent chest discomfort.  She states it is light and kind of a grabbing sensation.  She has arranged care for her son and has come back to be formally evaluated again by cardiology for cardiac stent placement.  Of note, she also states that she had a pimple on her left cheek during the hospital stay but has noted that it has increased in size and become more red.    Home Medications Prior to Admission medications   Medication Sig Start Date End Date Taking? Authorizing Provider  acetaminophen (TYLENOL) 500 MG tablet Take 500-1,000 mg by mouth every 6 (six) hours as needed.    [provider]  albuterol (PROVENTIL HFA;VENTOLIN HFA) 108 (90 BASE) MCG/ACT inhaler Inhale 2 puffs into the lungs every 4 (four) hours as needed for wheezing or shortness of breath. 05/21/14   Piepenbrink, Victorino Dike, PA-C  diphenhydrAMINE (BENADRYL) 25 MG tablet Take 25 mg by mouth every 6 (six) hours as needed for itching.    [provider]  fluticasone-salmeterol (ADVAIR HFA) 115-21 MCG/ACT inhaler Inhale 2 puffs into the lungs 2 (two) times daily. 01/26/23   [provider]  guaiFENesin (MUCINEX) 600 MG 12 hr tablet Take 600 mg by mouth 2 (two) times daily as needed (cold symptoms). For cold symptoms Patient not taking: Reported on 05/16/2023     [provider]  guaifenesin (ROBITUSSIN) 100 MG/5ML syrup Take 200 mg by mouth 3 (three) times daily as needed for cough. Patient not taking: Reported on 05/16/2023    [provider]  guaiFENesin-codeine 100-10 MG/5ML syrup Take 10 mLs by mouth every 6 (six) hours as needed for cough. Patient not taking: Reported on 05/16/2023 05/21/14   Piepenbrink, Victorino Dike, PA-C  ibuprofen (ADVIL,MOTRIN) 200 MG tablet Take 800-1,200 mg by mouth 2 (two) times daily as needed for moderate pain (pain).     [provider]  metFORMIN (GLUCOPHAGE) 500 MG tablet Take 500 mg by mouth 2 (two) times daily with a meal. Patient not taking: Reported on 05/16/2023    [provider]  predniSONE (DELTASONE) 20 MG tablet Take 2 tablets (40 mg total) by mouth daily. Patient not taking: Reported on 10/25/2014 05/21/14   Piepenbrink, Victorino Dike, PA-C  pseudoephedrine-guaifenesin (TUSSIN PE) 30-100 MG/5ML SYRP Take 5 mLs by mouth every 4 (four) hours as needed (cough). Patient not taking: Reported on 05/16/2023    [provider]  tobramycin-dexamethasone [facility]) ophthalmic solution Place 1 drop into both eyes 4 (four) times daily. Patient not taking: Reported on 05/16/2023    [provider]      Allergies    Iodinated contrast media, Penicillins, Bupropion, Clindamycin/lincomycin, Tetanus toxoids, Azithromycin, and Blue dyes (parenteral)    Review of Systems   Review of Systems  Cardiovascular:  Positive for chest pain.  Skin:  Positive for color change.  All other systems reviewed and are negative.   Physical Exam Updated Vital Signs BP 135/81   Pulse (!) 112   Temp 99.2 F (37.3 C) (Oral)   Resp 18   Ht 1.6 m (5\' 3" )   Wt 90.7 kg   LMP 05/08/2023 (Approximate)   SpO2 99%   BMI 35.43 kg/m  Physical Exam Vitals and nursing note reviewed.  Constitutional:      Appearance: She is well-developed. She is obese.  HENT:     Head: Normocephalic and atraumatic.      Comments: Left lower jaw with slight swelling and excoriation Eyes:     Pupils: Pupils are equal, round, and reactive to light.  Cardiovascular:     Rate and Rhythm: Normal rate and regular rhythm.     Heart sounds: Normal heart sounds.  Pulmonary:     Effort: Pulmonary effort is normal. No respiratory distress.     Breath sounds: No wheezing.  Abdominal:     Palpations: Abdomen is soft.  Musculoskeletal:     Cervical back: Neck supple.  Skin:    General: Skin is warm and dry.  Neurological:     Mental Status: She is alert and oriented to person, place, and time.  Psychiatric:        Mood and Affect: Mood normal.     ED Results / Procedures / Treatments   Labs (all labs ordered are listed, but only abnormal results are displayed) Labs Reviewed  BASIC METABOLIC PANEL - Abnormal; Notable for the following components:      Result Value   Sodium 134 (*)    Chloride 97 (*)    CO2 21 (*)    Glucose, Bld 384 (*)    Anion gap 16 (*)    All other components within normal limits  CBC - Abnormal; Notable for the following components:   WBC 18.8 (*)    RBC 5.14 (*)    All other components within normal limits  TROPONIN I (HIGH SENSITIVITY) - Abnormal; Notable for the following components:   Troponin I (High Sensitivity) 573 (*)    All other components within normal limits  TROPONIN I (HIGH SENSITIVITY) - Abnormal; Notable for the following components:   Troponin I (High Sensitivity) 620 (*)    All other components within normal limits  HCG, SERUM, QUALITATIVE    EKG EKG Interpretation Date/Time:  Wednesday May 17 2023 19:40:16 EDT Ventricular Rate:  107 PR Interval:  140 QRS Duration:  76 QT Interval:  302 QTC Calculation: 403 R Axis:   44  Text Interpretation: Sinus tachycardia Low voltage QRS Cannot rule out Anterior infarct , age undetermined Abnormal ECG When compared with ECG of 15-May-2023 21:26, PREVIOUS ECG IS PRESENT Confirmed by Eber Hong ([id number]) on  05/18/2023 2:39:01 PM  Radiology DG Chest 2 View  Result Date: 05/18/2023 CLINICAL DATA:  Atrial fibrillation and chest pain. EXAM: CHEST - 2 VIEW COMPARISON:  Portable chest 05/15/2023 FINDINGS: The heart size and mediastinal contours are within normal limits. A stent is again noted in the circumflex coronary artery. Both lungs are clear. The visualized skeletal structures are intact, with thoracic spondylosis and slight thoracic dextroscoliosis. IMPRESSION: 1. No evidence of acute chest disease or changes. 2. Circumflex coronary artery stent. Electronically Signed   By: Almira Bar M.D.   On: 05/18/2023 00:53   ECHOCARDIOGRAM COMPLETE  Result Date: 05/16/2023    ECHOCARDIOGRAM REPORT   Patient Name:   [facility]  Date of Exam: 05/16/2023 Medical Rec #:  [id number]         Height:       63.0 in Accession #:    [id number]        Weight:       197.3 lb Date of Birth:  August 25, 1971         BSA:          1.923 m Patient Age:    52 years          BP:           119/88 mmHg Patient Gender: F                 HR:           89 bpm. Exam Location:  Inpatient Procedure: 2D Echo, Cardiac Doppler, Color Doppler and Intracardiac            Opacification Agent Indications:    Arrhythmia  History:        Patient has no prior history of Echocardiogram examinations.                 Previous Myocardial Infarction, Arrythmias:Atrial Fibrillation;                 Risk Factors:Diabetes.  Sonographer:    Darlys Gales Referring Phys: [id number] VASUNDHRA RATHORE IMPRESSIONS  1. There is no left ventricular thrombus (Definity contrast was used), but there is apical swirling/slow flow. Left ventricular ejection fraction, by estimation, is 30 to 35%. The left ventricle has moderately decreased function. The left ventricle demonstrates regional wall motion abnormalities (see scoring diagram/findings for description). Left ventricular diastolic parameters are consistent with Grade I diastolic dysfunction (impaired relaxation). There is  mild dyskinesis of the left ventricular, apical anteroseptal wall and anterior wall. There is severe hypokinesis of the left ventricular, mid-apical anteroseptal wall and anterior wall.  2. Right ventricular systolic function is normal. The right ventricular size is normal. Tricuspid regurgitation signal is inadequate for assessing PA pressure.  3. The mitral valve is normal in structure. No evidence of mitral valve regurgitation.  4. The aortic valve is tricuspid. Aortic valve regurgitation is not visualized.  5. The inferior vena cava is normal in size with greater than 50% respiratory variability, suggesting right atrial pressure of 3 mmHg. FINDINGS  Left Ventricle: There is no left ventricular thrombus (Definity contrast was used), but there is apical swirling/slow flow. Left ventricular ejection fraction, by estimation, is 30 to 35%. The left ventricle has moderately decreased function. The left ventricle demonstrates regional wall motion abnormalities. Mild dyskinesis of the left ventricular, apical anteroseptal wall and anterior wall. Severe hypokinesis of the left ventricular, mid-apical anteroseptal wall and anterior wall. Definity contrast agent was given IV to delineate the left ventricular endocardial borders. The left ventricular internal cavity size was normal in size. There is no left ventricular hypertrophy. Left ventricular diastolic parameters are consistent with Grade I diastolic dysfunction (impaired relaxation). Normal left ventricular filling pressure.  LV Wall Scoring: The apical septal segment, apical anterior segment, and apex are dyskinetic. The mid anteroseptal segment, apical lateral segment, mid anterior segment, and apical inferior segment are hypokinetic. The antero-lateral wall, inferior wall, posterior wall, basal anteroseptal segment, mid inferoseptal segment, basal anterior segment, and basal inferoseptal segment are normal. Right Ventricle: The right ventricular size is normal. No  increase in right ventricular wall thickness. Right ventricular systolic function is normal. Tricuspid regurgitation signal is inadequate for assessing PA pressure. Left Atrium:  Left atrial size was normal in size. Right Atrium: Right atrial size was normal in size. Pericardium: There is no evidence of pericardial effusion. Mitral Valve: The mitral valve is normal in structure. No evidence of mitral valve regurgitation. Tricuspid Valve: The tricuspid valve is normal in structure. Tricuspid valve regurgitation is not demonstrated. Aortic Valve: The aortic valve is tricuspid. Aortic valve regurgitation is not visualized. Aortic valve mean gradient measures 4.0 mmHg. Aortic valve peak gradient measures 6.0 mmHg. Aortic valve area, by VTI measures 2.28 cm. Pulmonic Valve: The pulmonic valve was not well visualized. Pulmonic valve regurgitation is not visualized. No evidence of pulmonic stenosis. Aorta: The aortic root is normal in size and structure. Venous: The inferior vena cava is normal in size with greater than 50% respiratory variability, suggesting right atrial pressure of 3 mmHg. IAS/Shunts: No atrial level shunt detected by color flow Doppler.  LEFT VENTRICLE PLAX 2D LVIDd:         4.80 cm LVIDs:         3.60 cm LV PW:         0.90 cm LV IVS:        1.20 cm LVOT diam:     1.80 cm LV SV:         61 LV SV Index:   32 LVOT Area:     2.54 cm  LEFT ATRIUM             Index        RIGHT ATRIUM          Index LA Vol (A2C):   26.0 ml 13.52 ml/m  RA Area:     9.85 cm LA Vol (A4C):   50.8 ml 26.42 ml/m  RA Volume:   21.30 ml 11.08 ml/m LA Biplane Vol: 36.9 ml 19.19 ml/m  AORTIC VALVE AV Area (Vmax):    2.19 cm AV Area (Vmean):   2.09 cm AV Area (VTI):     2.28 cm AV Vmax:           122.00 cm/s AV Vmean:          96.100 cm/s AV VTI:            0.266 m AV Peak Grad:      6.0 mmHg AV Mean Grad:      4.0 mmHg LVOT Vmax:         105.00 cm/s LVOT Vmean:        79.000 cm/s LVOT VTI:          0.238 m LVOT/AV VTI ratio:  0.89  AORTA Ao Root diam: 2.70 cm MITRAL VALVE MV Area (PHT): 5.42 cm     SHUNTS MV Decel Time: 140 msec     Systemic VTI:  0.24 m MV E velocity: 60.80 cm/s   Systemic Diam: 1.80 cm MV A velocity: 116.00 cm/s MV E/A ratio:  0.52 Mihai Croitoru MD Electronically signed by Thurmon Fair MD Signature Date/Time: 05/16/2023/10:00:44 AM    Final     Procedures .Critical Care  Performed by: Shon Baton, MD Authorized by: Shon Baton, MD   Critical care provider statement:    Critical care time (minutes):  31   Critical care was necessary to treat or prevent imminent or life-threatening deterioration of the following conditions: NSTEMI.   Critical care was time spent personally by me on the following activities:  Development of treatment plan with patient or surrogate, discussions with consultants, evaluation of patient's response to treatment, examination  of patient, ordering and review of laboratory studies, ordering and review of radiographic studies, ordering and performing treatments and interventions, pulse oximetry, re-evaluation of patient's condition and review of old charts     Medications Ordered in ED Medications  nitroGLYCERIN 50 mg in dextrose 5 % 250 mL (0.2 mg/mL) infusion (has no administration in time range)  cefTRIAXone (ROCEPHIN) 1 g in sodium chloride 0.9 % 100 mL IVPB (has no administration in time range)    ED Course/ Medical Decision Making/ A&P                             Medical Decision Making Risk Prescription drug management. Decision regarding hospitalization.   This patient presents to the ED for concern of chest pain, elevated troponins, facial cellulitis, this involves an extensive number of treatment options, and is a complaint that carries with it a high risk of complications and morbidity.  I considered the following differential and admission for this acute, potentially life threatening condition.  The differential diagnosis includes ACS, PE,  pneumothorax, pneumonia, sepsis, simple cellulitis  MDM:    This is a 52 year old female who presents with concerns for previously elevated troponins and intermittent chest pain.  She just left the hospital AGAINST MEDICAL ADVICE with the plan for cardiac evaluation and catheterization.  She has found support for her son.  She states that she is not currently having active chest pain but has had some chest pain in the interim.  She is also noted some left jaw swelling.  She appears to have some facial cellulitis.  Patient was given IV Rocephin.  Troponin today 620 and arguably flat from discharge but still significantly elevated.  She does have a leukocytosis to 18.1 but is not overtly septic appearing.  Will start on IV heparin and nitroglycerin.  IV antibiotics given.  Discussed with hospitalist for admission.  (Labs, imaging, consults)  Labs: I Ordered, and personally interpreted labs.  The pertinent results include: CBC, BMP, troponin x 2  Imaging Studies ordered: I ordered imaging studies including chest x-ray I independently visualized and interpreted imaging. I agree with the radiologist interpretation  Additional history obtained from chart review.  External records from outside source obtained and reviewed including discharge summary  Cardiac Monitoring: The patient was maintained on a cardiac monitor.  If on the cardiac monitor, I personally viewed and interpreted the cardiac monitored which showed an underlying rhythm of: Sinus rhythm  Reevaluation: After the interventions noted above, I reevaluated the patient and found that they have :stayed the same  Social Determinants of Health:  lives independently  Disposition: Admit  Co morbidities that complicate the patient evaluation  Past Medical History:  Diagnosis Date   Asthma    Diabetes mellitus without complication (HCC)    Hyperlipemia      Medicines Meds ordered this encounter  Medications   DISCONTD:  nitroGLYCERIN 50 mg in dextrose 5 % 250 mL (0.2 mg/mL) infusion   cefTRIAXone (ROCEPHIN) 1 g in sodium chloride 0.9 % 100 mL IVPB    Order Specific Question:   Antibiotic Indication:    Answer:   Cellulitis   heparin bolus via infusion 4,000 Units   DISCONTD: heparin ADULT infusion 100 units/mL (25000 units/223mL)   heparin 78295 UT/250ML infusion    Berrier, Candace B: cabinet override   OR Linked Order Group    aspirin chewable tablet 324 mg    aspirin suppository 300 mg  aspirin EC tablet 81 mg   nitroGLYCERIN (NITROSTAT) SL tablet 0.4 mg   DISCONTD: acetaminophen (TYLENOL) tablet 650 mg   DISCONTD: ondansetron (ZOFRAN) injection 4 mg   DISCONTD: vancomycin (VANCOCIN) IVPB 1000 mg/200 mL premix    Order Specific Question:   Antibiotic Indication:    Answer:   Cellulitis   DISCONTD: insulin aspart (novoLOG) injection 0-6 Units    Order Specific Question:   Correction coverage:    Answer:   Very Sensitive (ESRD/Dialysis)    Order Specific Question:   CBG < 70:    Answer:   Implement Hypoglycemia Standing Orders and refer to Hypoglycemia Standing Orders sidebar report    Order Specific Question:   CBG 70 - 120:    Answer:   0 units    Order Specific Question:   CBG 121 - 150:    Answer:   0 units    Order Specific Question:   CBG 151 - 200:    Answer:   1 unit    Order Specific Question:   CBG 201-250:    Answer:   2 units    Order Specific Question:   CBG 251-300:    Answer:   3 units    Order Specific Question:   CBG 301-350:    Answer:   4 units    Order Specific Question:   CBG 351-400:    Answer:   5 units    Order Specific Question:   CBG > 400    Answer:   Give 6 units and call MD   metoprolol tartrate (LOPRESSOR) tablet 25 mg   atorvastatin (LIPITOR) tablet 80 mg   DISCONTD: acetaminophen (TYLENOL) tablet 650 mg   oxyCODONE (Oxy IR/ROXICODONE) immediate release tablet 5 mg   ondansetron (ZOFRAN) injection 4 mg   vancomycin (VANCOREADY) IVPB 1500 mg/300 mL    mometasone-formoterol (DULERA) 200-5 MCG/ACT inhaler 2 puff   albuterol (PROVENTIL) (2.5 MG/3ML) 0.083% nebulizer solution 3 mL   DISCONTD: Vancomycin (VANCOCIN) 1,250 mg in sodium chloride 0.9 % 250 mL IVPB    Order Specific Question:   Indication:    Answer:   Cellulitis   vancomycin (VANCOREADY) IVPB 1250 mg/250 mL   insulin glargine-yfgn (SEMGLEE) injection 20 Units   insulin aspart (novoLOG) injection 0-20 Units    Order Specific Question:   Correction coverage:    Answer:   Resistant (obese, steroids)    Order Specific Question:   CBG < 70:    Answer:   Implement Hypoglycemia Standing Orders and refer to Hypoglycemia Standing Orders sidebar report    Order Specific Question:   CBG 70 - 120:    Answer:   0 units    Order Specific Question:   CBG 121 - 150:    Answer:   3 units    Order Specific Question:   CBG 151 - 200:    Answer:   4 units    Order Specific Question:   CBG 201 - 250:    Answer:   7 units    Order Specific Question:   CBG 251 - 300:    Answer:   11 units    Order Specific Question:   CBG 301 - 350:    Answer:   15 units    Order Specific Question:   CBG 351 - 400:    Answer:   20 units    Order Specific Question:   CBG > 400    Answer:   call MD and  obtain STAT lab verification   HYDROmorphone (DILAUDID) injection 0.5 mg   acetaminophen (TYLENOL) tablet 1,000 mg   sodium chloride flush (NS) 0.9 % injection 3 mL   sodium chloride flush (NS) 0.9 % injection 3 mL   0.9 %  sodium chloride infusion   DISCONTD: 0.9 %  sodium chloride infusion   predniSONE (DELTASONE) tablet 50 mg   diphenhydrAMINE (BENADRYL) capsule 50 mg   0.9 %  sodium chloride infusion   DISCONTD: 0.9 %  sodium chloride infusion   DISCONTD: predniSONE (DELTASONE) tablet 50 mg   DISCONTD: diphenhydrAMINE (BENADRYL) capsule 50 mg   DISCONTD: diphenhydrAMINE (BENADRYL) injection 50 mg   DISCONTD: diphenhydrAMINE (BENADRYL) capsule 50 mg   DISCONTD: diphenhydrAMINE (BENADRYL) capsule 50 mg    heparin bolus via infusion 2,000 Units   heparin ADULT infusion 100 units/mL (25000 units/253mL)   insulin aspart (novoLOG) injection 0-5 Units    Order Specific Question:   Correction coverage:    Answer:   HS scale    Order Specific Question:   CBG < 70:    Answer:   Implement Hypoglycemia Standing Orders and refer to Hypoglycemia Standing Orders sidebar report    Order Specific Question:   CBG 70 - 120:    Answer:   0 units    Order Specific Question:   CBG 121 - 150:    Answer:   0 units    Order Specific Question:   CBG 151 - 200:    Answer:   0 units    Order Specific Question:   CBG 201 - 250:    Answer:   2 units    Order Specific Question:   CBG 251 - 300:    Answer:   3 units    Order Specific Question:   CBG 301 - 350:    Answer:   4 units    Order Specific Question:   CBG 351 - 400:    Answer:   5 units    Order Specific Question:   CBG > 400    Answer:   call MD and obtain STAT lab verification   0.9 %  sodium chloride infusion   OR Linked Order Group    diphenhydrAMINE (BENADRYL) capsule 50 mg    diphenhydrAMINE (BENADRYL) injection 50 mg   predniSONE (DELTASONE) tablet 50 mg   iohexol (OMNIPAQUE) 350 MG/ML injection 75 mL    I have reviewed the patients home medicines and have made adjustments as needed  Problem List / ED Course: Problem List Items Addressed This Visit   None Visit Diagnoses     ACS (acute coronary syndrome) (HCC)    -  Primary   Relevant Medications   heparin bolus via infusion 4,000 Units (Completed)   aspirin chewable tablet 324 mg (Completed)   aspirin suppository 300 mg (Completed)   aspirin EC tablet 81 mg (Start on 05/19/2023 10:00 AM)   nitroGLYCERIN (NITROSTAT) SL tablet 0.4 mg   metoprolol tartrate (LOPRESSOR) tablet 25 mg   atorvastatin (LIPITOR) tablet 80 mg   heparin bolus via infusion 2,000 Units (Completed)   heparin ADULT infusion 100 units/mL (25000 units/214mL)   Cellulitis, face                        Final Clinical Impression(s) / ED Diagnoses Final diagnoses:  ACS (acute coronary syndrome) (HCC)  Cellulitis, face    Rx / DC Orders ED Discharge Orders     None  Shon Baton, MD 05/19/23 [phone]

## 2023-05-18 NOTE — Progress Notes (Signed)
[facility] night cross cover note:   I was notified by RN the patient's evening blood sugar of 403.  I subsequently added nightly sliding scale insulin coverage, from which the patient will currently receive 5 units of novolog.       Newton Pigg, DO Hospitalist

## 2023-05-18 NOTE — Progress Notes (Signed)
Brief same day note:    Patient is a 52 year old female with history of poorly controlled diabetes, asthma, hyperlipidemia, coronary artery disease with PCI and stent placement, recent admission for suspected acute MI but left AMA before left heart cath presented back with complaint of left lower facial swelling, pain.  She was admitted here 3 days ago for chest pain, elevated troponin, echo showed EF of 35%, severe hypokinesis.  She was bradycardic but she left AMA to take care of her children. On presentation she was afebrile but blood pressure was elevated, EKG showed sinus tachycardia.  Chest x-ray did not show pneumonia.  Troponins were elevated, WC count of 18,000.  Patient was started on Rocephin for facial cellulitis and also started on IV heparin for NSTEMI.  Cardiology consulted.  Patient seen and examined the bedside today.  Hemodynamically stable.  She  complains of pain on the left side of the face where the abscess is.  Assessment and plan:  NSTEMI: Scenario as above.  Still complaining of intermittent chest pain.  Elevated troponin.  On IV heparin.  Might need to be planned for cath again.  Cardiology following  Facial cellulitis/abscess: Currently afebrile. Continue ceftriaxone.Has moderate leucocytosis.  Ultrasound of the face showed 2.2 x 1.3 x 2.3 cm on anterior left jaw.  ENT consulted,discussed with Dr Jenne Pane.  Might need I and D. Dr. Ellery Plunk recommended CT maxillofacial with contrast  Type 2 diabetes: Uncontrolled.  Recent  A1c of 11.2.  Continue current insulin regimen.  Monitor blood sugars.  Will consult diabetic coordinator.  Patient denies that her diabetes is false.  She states she does not have any diabetes despite her hemoglobin A1c of more than 10.  She states the medications that the doctors provided developed the diabetes  Hyperlipidemia: On statin  Hypomagnesemia: Supplemented with magnesium  Hypertension: Continue current medication.  On metoprolol  Asthma:  Currently not in exacerbation.  Continue bronchodilators

## 2023-05-18 NOTE — Progress Notes (Signed)
ANTICOAGULATION CONSULT NOTE - Initial Consult  Pharmacy Consult for heparin Indication: chest pain/ACS  Allergies  Allergen Reactions   Iodinated Contrast Media Shortness Of Breath and Anaphylaxis   Penicillins Hives   Bupropion Itching    Other Reaction(s): Other  Pt states she has psychological reaction to generic Wellbutrin   Pt states she has psychological reaction to generic Wellbutrin   Clindamycin/Lincomycin Nausea And Vomiting   Tetanus Toxoids Other (See Comments) and Itching    Pain/knots in arms  Other Reaction(s): Other (See Comments)   Azithromycin Nausea And Vomiting and Nausea Only   Blue Dyes (Parenteral) Rash    Patient Measurements: Height: 5\' 3"  (160 cm) Weight: 90.7 kg (200 lb) IBW/kg (Calculated) : 52.4 Heparin Dosing Weight: 73 kg  Vital Signs: Temp: 99.2 F (37.3 C) (07/10 2357) Temp Source: Oral (07/10 2357) BP: 135/81 (07/10 2357) Pulse Rate: 112 (07/10 2357)  Labs: Recent Labs    05/15/23 2058 05/15/23 2313 05/16/23 0622 05/16/23 1021 05/17/23 1954 05/17/23 2146  HGB 15.8*  --  15.2*  --  14.7  --   HCT 45.2  --  44.3  --  43.1  --   PLT 280  --  255  --  273  --   HEPARINUNFRC  --   --  0.21*  --   --   --   CREATININE 0.81  --  0.78  --  0.87  --   TROPONINIHS 16   < >  --  766* 573* 620*   < > = values in this interval not displayed.    Estimated Creatinine Clearance: 82.7 mL/min (by C-G formula based on SCr of 0.87 mg/dL).   Medical History: Past Medical History:  Diagnosis Date   Asthma    Diabetes mellitus without complication (HCC)    Hyperlipemia     Medications:  (Not in a hospital admission)  Scheduled:   heparin  4,000 Units Intravenous Once   Assessment: 59 yoF previously admitted for c/o left-sided CP radiating to LUE and jaw associated w/ SOB, nausea, lightheadedness, and dizziness and found to be in afib RVR. Pharmacy consulted to dose heparin for ACS/STEMI. Previously left AMA, but returned for  continued care. Previous rate was increased to 1350 units/hr after heparin level returned at 0.21. Troponins 620 and CBC stable.   Goal of Therapy:  Heparin level 0.3-0.7 units/ml Monitor platelets by anticoagulation protocol: Yes   Plan:  Give 4000 units bolus x 1 Start heparin infusion at 1350 units/hr Check anti-Xa level in 6 hours and daily while on heparin Continue to monitor H&H and platelets Follow up cardiology plans for [facility]  Arabella Merles, PharmD. Clinical Pharmacist 05/18/2023 2:16 AM

## 2023-05-18 NOTE — Progress Notes (Signed)
   Patient noted to have a contrast dye allergy. Will need prophylaxis prior to cath, therefore, it has been postponed until tomorrow. Ok to resume diet today. Keep NPO p MN.  [name], MD, [facility], FACP  [place]  [facility]  Medical Director of the [facility] Diplomate of the American Board of Clinical Lipidology Attending Cardiologist  Direct Dial: [phone]  Fax: [phone]  Website:  [email or web address]

## 2023-05-18 NOTE — Consult Note (Signed)
Reason for Consult: Cheek infection Referring Physician: Hospitalist  Kathleen Marks is an 52 y.o. female.  HPI: 52 year old female started with a pimple of the left lower cheek a couple of days ago.  She came to the hospital for cardiac reasons at that time but went home the next day.  Overnight last night, the lower cheek began to swell and hurt, reaching its peak swelling early this morning.  She came back to the ER last night and was admitted this morning.  She was started on vancomycin.  This afternoon, she reports improvement in left cheek pain and swelling.  Past Medical History:  Diagnosis Date   Asthma    Diabetes mellitus without complication (HCC)    Hyperlipemia     Past Surgical History:  Procedure Laterality Date   ARTHROSCOPIC REPAIR ACL     FEMUR SURGERY      History reviewed. No pertinent family history.  Social History:  reports that she has been smoking cigarettes. She does not have any smokeless tobacco history on file. She reports that she does not drink alcohol and does not use drugs.  Allergies:  Allergies  Allergen Reactions   Iodinated Contrast Media Shortness Of Breath and Anaphylaxis   Penicillins Hives   Bupropion Itching    Pt states she has psychological reaction to generic Wellbutrin   Clindamycin/Lincomycin Nausea And Vomiting   Tetanus Toxoids Itching and Other (See Comments)    Pain/knots in arms    Azithromycin Nausea And Vomiting and Nausea Only    States she can tolerate.    Blue Dyes (Parenteral) Rash    Medications: I have reviewed the patient's current medications.  Results for orders placed or performed during the hospital encounter of 05/17/23 (from the past 48 hour(s))  Basic metabolic panel     Status: Abnormal   Collection Time: 05/17/23  7:54 PM  Result Value Ref Range   Sodium 134 (L) 135 - 145 mmol/L   Potassium 3.7 3.5 - 5.1 mmol/L   Chloride 97 (L) 98 - 111 mmol/L   CO2 21 (L) 22 - 32 mmol/L   Glucose, Bld 384  (H) 70 - 99 mg/dL    Comment: Glucose reference range applies only to samples taken after fasting for at least 8 hours.   BUN 16 6 - 20 mg/dL   Creatinine, Ser 0.98 0.44 - 1.00 mg/dL   Calcium 9.1 8.9 - 11.9 mg/dL   GFR, Estimated >14 >78 mL/min    Comment: (NOTE) Calculated using the CKD-EPI Creatinine Equation (2021)    Anion gap 16 (H) 5 - 15    Comment: Performed at [facility], [address], [place], [place] [address]  CBC     Status: Abnormal   Collection Time: 05/17/23  7:54 PM  Result Value Ref Range   WBC 18.8 (H) 4.0 - 10.5 K/uL   RBC 5.14 (H) 3.87 - 5.11 MIL/uL   Hemoglobin 14.7 12.0 - 15.0 g/dL   HCT 13.0 86.5 - 78.4 %   MCV 83.9 80.0 - 100.0 fL   MCH 28.6 26.0 - 34.0 pg   MCHC 34.1 30.0 - 36.0 g/dL   RDW 69.6 29.5 - 28.4 %   Platelets 273 150 - 400 K/uL   nRBC 0.0 0.0 - 0.2 %    Comment: Performed at [facility], [address], [place], [place] [address]  Troponin I (High Sensitivity)     Status: Abnormal   Collection Time:  05/17/23  7:54 PM  Result Value Ref Range   Troponin I (High Sensitivity) 573 (HH) <18 ng/L    Comment: CRITICAL VALUE NOTED. VALUE IS CONSISTENT WITH PREVIOUSLY REPORTED/CALLED VALUE (NOTE) Elevated high sensitivity troponin I (hsTnI) values and significant  changes across serial measurements may suggest ACS but many other  chronic and acute conditions are known to elevate hsTnI results.  Refer to the "Links" section for chest pain algorithms and additional  guidance. Performed at [facility], [address], [place], [place] [address]   hCG, serum, qualitative     Status: None   Collection Time: 05/17/23  7:54 PM  Result Value Ref Range   Preg, Serum NEGATIVE NEGATIVE    Comment:        THE SENSITIVITY OF THIS METHODOLOGY IS >10 mIU/mL. Performed at [facility], [address], [place], [place] [address]   Troponin I (High Sensitivity)     Status: Abnormal   Collection Time: 05/17/23  9:46 PM  Result  Value Ref Range   Troponin I (High Sensitivity) 620 (HH) <18 ng/L    Comment: CRITICAL VALUE NOTED. VALUE IS CONSISTENT WITH PREVIOUSLY REPORTED/CALLED VALUE (NOTE) Elevated high sensitivity troponin I (hsTnI) values and significant  changes across serial measurements may suggest ACS but many other  chronic and acute conditions are known to elevate hsTnI results.  Refer to the "Links" section for chest pain algorithms and additional  guidance. Performed at [facility], [address], [place], [place] [address]   Basic metabolic panel     Status: Abnormal   Collection Time: 05/18/23  2:36 AM  Result Value Ref Range   Sodium 132 (L) 135 - 145 mmol/L   Potassium 3.5 3.5 - 5.1 mmol/L   Chloride 101 98 - 111 mmol/L   CO2 20 (L) 22 - 32 mmol/L   Glucose, Bld 300 (H) 70 - 99 mg/dL    Comment: Glucose reference range applies only to samples taken after fasting for at least 8 hours.   BUN 10 6 - 20 mg/dL   Creatinine, Ser 9.14 0.44 - 1.00 mg/dL   Calcium 8.6 (L) 8.9 - 10.3 mg/dL   GFR, Estimated >78 >29 mL/min    Comment: (NOTE) Calculated using the CKD-EPI Creatinine Equation (2021)    Anion gap 11 5 - 15    Comment: Performed at [facility], [address], [place], [place] [address]  Magnesium     Status: Abnormal   Collection Time: 05/18/23  2:36 AM  Result Value Ref Range   Magnesium 1.6 (L) 1.7 - 2.4 mg/dL    Comment: Performed at [facility], [address], [place], [place] [address]  CBC     Status: Abnormal   Collection Time: 05/18/23  2:36 AM  Result Value Ref Range   WBC 18.1 (H) 4.0 - 10.5 K/uL   RBC 5.32 (H) 3.87 - 5.11 MIL/uL   Hemoglobin 15.1 (H) 12.0 - 15.0 g/dL   HCT 84.6 96.2 - 95.2 %   MCV 82.9 80.0 - 100.0 fL   MCH 28.4 26.0 - 34.0 pg   MCHC 34.2 30.0 - 36.0 g/dL   RDW 84.1 32.4 - 40.1 %   Platelets 282 150 - 400 K/uL   nRBC 0.0 0.0 - 0.2 %    Comment: Performed at [facility], [address], [place], [place] [address]   Troponin I (High Sensitivity)     Status:  Abnormal   Collection Time: 05/18/23  4:50 AM  Result Value Ref Range   Troponin I (High Sensitivity) 585 (HH) <18 ng/L    Comment: CRITICAL VALUE NOTED. VALUE IS CONSISTENT WITH PREVIOUSLY REPORTED/CALLED VALUE (NOTE) Elevated high sensitivity troponin I (hsTnI) values and significant  changes across serial measurements may suggest ACS but many other  chronic and acute conditions are known to elevate hsTnI results.  Refer to the "Links" section for chest pain algorithms and additional  guidance. Performed at [facility], [address] [place], [place] [address]   CBG monitoring, ED     Status: Abnormal   Collection Time: 05/18/23  5:00 AM  Result Value Ref Range   Glucose-Capillary 238 (H) 70 - 99 mg/dL    Comment: Glucose reference range applies only to samples taken after fasting for at least 8 hours.  CBG monitoring, ED     Status: Abnormal   Collection Time: 05/18/23  7:59 AM  Result Value Ref Range   Glucose-Capillary 226 (H) 70 - 99 mg/dL    Comment: Glucose reference range applies only to samples taken after fasting for at least 8 hours.  Heparin level (unfractionated)     Status: Abnormal   Collection Time: 05/18/23  8:00 AM  Result Value Ref Range   Heparin Unfractionated 0.24 (L) 0.30 - 0.70 IU/mL    Comment: (NOTE) The clinical reportable range upper limit is being lowered to >1.10 to align with the FDA approved guidance for the current laboratory assay.  If heparin results are below expected values, and patient dosage has  been confirmed, suggest follow up testing of antithrombin III levels. Performed at [facility], [address] [place], [place] [address]   Heparin level (unfractionated)     Status: Abnormal   Collection Time: 05/18/23  3:51 PM  Result Value Ref Range   Heparin Unfractionated 0.10 (L) 0.30 - 0.70 IU/mL    Comment: (NOTE) The clinical reportable range upper limit is being lowered to  >1.10 to align with the FDA approved guidance for the current laboratory assay.  If heparin results are below expected values, and patient dosage has  been confirmed, suggest follow up testing of antithrombin III levels. Performed at [facility], [address] [place], [place] [address]   Glucose, capillary     Status: Abnormal   Collection Time: 05/18/23  5:09 PM  Result Value Ref Range   Glucose-Capillary 266 (H) 70 - 99 mg/dL    Comment: Glucose reference range applies only to samples taken after fasting for at least 8 hours.    US SOFT TISSUE HEAD & NECK (NON-THYROID)  Result Date: 05/18/2023 CLINICAL DATA:  Facial cellulitis EXAM: ULTRASOUND OF HEAD/NECK SOFT TISSUES TECHNIQUE: Ultrasound examination of the head and neck soft tissues was performed in the area of clinical concern. COMPARISON:  None Available. FINDINGS: Along the anterior left jaw line, there is an ill-defined 2.2 x 1.3 x 2.3 cm area of somewhat heterogeneous echogenicity, which correlates with the palpable abnormality. No fluid collection or cyst is seen. No internal color flow is noted in this area. IMPRESSION: Ill-defined 2.2 x 1.3 x 2.3 cm area of somewhat heterogeneous echogenicity along the anterior left jaw line, which correlates with the palpable abnormality. This is nonspecific, but could represent a phlegmon or developing abscess. Correlate with physical exam and consider CT maxillofacial with contrast for further evaluation if clinically indicated. Electronically Signed   By: Elaina Pattee.D.  On: 05/18/2023 03:43   DG Chest 2 View  Result Date: 05/18/2023 CLINICAL DATA:  Atrial fibrillation and chest pain. EXAM: CHEST - 2 VIEW COMPARISON:  Portable chest 05/15/2023 FINDINGS: The heart size and mediastinal contours are within normal limits. A stent is again noted in the circumflex coronary artery. Both lungs are clear. The visualized skeletal structures are intact, with thoracic spondylosis and slight  thoracic dextroscoliosis. IMPRESSION: 1. No evidence of acute chest disease or changes. 2. Circumflex coronary artery stent. Electronically Signed   By: Almira Bar M.D.   On: 05/18/2023 00:53    Review of Systems  All other systems reviewed and are negative.  Blood pressure 114/64, pulse 91, temperature 98 F (36.7 C), temperature source Oral, resp. rate 16, height 5\' 3"  (1.6 m), weight 88.3 kg, last menstrual period 05/08/2023, SpO2 99%. Physical Exam Constitutional:      Appearance: Normal appearance.  HENT:     Head: Normocephalic and atraumatic.     Right Ear: External ear normal.     Left Ear: External ear normal.     Nose: Nose normal.     Mouth/Throat:     Mouth: Mucous membranes are moist.     Pharynx: Oropharynx is clear.     Comments: Edentulous. Eyes:     Extraocular Movements: Extraocular movements intact.     Conjunctiva/sclera: Conjunctivae normal.     Pupils: Pupils are equal, round, and reactive to light.  Neck:     Comments: Left lower cheek with dark scabbed spot with surrounding and underlying edema but without fluctuant area or palpable abscess space. Cardiovascular:     Rate and Rhythm: Normal rate.  Pulmonary:     Effort: Pulmonary effort is normal.  Skin:    General: Skin is warm and dry.  Neurological:     General: No focal deficit present.     Mental Status: She is alert and oriented to person, place, and time.  Psychiatric:        Mood and Affect: Mood normal.        Behavior: Behavior normal.        Thought Content: Thought content normal.        Judgment: Judgment normal.     Assessment/Plan: Left cheek cellulitis  I reviewed her ultrasound result and discussed her case with the Hospitalist service.  I recommended proceeding with a contrasted CT scan to evaluate further.  At this point, I do not perceive a drainable abscess on exam.  This infection could represent MRSA.  I agree with antibiotic coverage with that in mind.  Will  follow.  Christia Reading 05/18/2023, 7:30 PM

## 2023-05-18 NOTE — ED Notes (Signed)
ED TO INPATIENT HANDOFF REPORT  ED Nurse Name and Phone #: Cheetara Hoge [phone]  S Name/Age/Gender Kathleen Marks 52 y.o. female Room/Bed: 009C/009C  Code Status   Code Status: Full Code  Home/SNF/Other Home Patient oriented to: self, place, time, and situation Is this baseline? Yes   Triage Complete: Triage complete  Chief Complaint Facial cellulitis [L03.211]  Triage Note Pt arrives to ED reporting that she had an appointment for cardiac stent placement but left AMA for family issues. Pt is checking back in for same reason. Pt also endorses swelling and sore to left side of face from solution that was used pre-op. Pt is unsure of what solution was. Pt w/ hx of a-fib   Allergies Allergies  Allergen Reactions   Iodinated Contrast Media Shortness Of Breath and Anaphylaxis   Penicillins Hives   Bupropion Itching    Pt states she has psychological reaction to generic Wellbutrin   Clindamycin/Lincomycin Nausea And Vomiting   Tetanus Toxoids Itching and Other (See Comments)    Pain/knots in arms    Azithromycin Nausea And Vomiting and Nausea Only    States she can tolerate.    Blue Dyes (Parenteral) Rash    Level of Care/Admitting Diagnosis ED Disposition     ED Disposition  Admit   Condition  --   Comment  Hospital Area: [facility] [[id number]]  Level of Care: Telemetry Cardiac [[id number]]  May admit patient to [facility] or [facility] if equivalent level of care is available:: No  Covid Evaluation: Asymptomatic - no recent exposure (last 10 days) testing not required  Diagnosis: Facial cellulitis [784696]  Admitting Physician: Briscoe Deutscher [[id number]]  Attending Physician: Briscoe Deutscher [[id number]]  Certification:: I certify this patient will need inpatient services for at least 2 midnights  Estimated Length of Stay: 3          B Medical/Surgery History Past Medical History:  Diagnosis Date   Asthma    Diabetes mellitus without  complication (HCC)    Hyperlipemia    Past Surgical History:  Procedure Laterality Date   ARTHROSCOPIC REPAIR ACL     FEMUR SURGERY       A IV Location/Drains/Wounds Patient Lines/Drains/Airways Status     Active Line/Drains/Airways     Name Placement date Placement time Site Days   Peripheral IV 05/18/23 20 G 1" Anterior;Distal;Right Forearm 05/18/23  0125  Forearm  less than 1   Peripheral IV 05/18/23 20 G 1" Anterior;Proximal;Right Forearm 05/18/23  0125  Forearm  less than 1            Intake/Output Last 24 hours  Intake/Output Summary (Last 24 hours) at 05/18/2023 1246 Last data filed at 05/18/2023 0515 Gross per 24 hour  Intake 301.63 ml  Output --  Net 301.63 ml    Labs/Imaging Results for orders placed or performed during the hospital encounter of 05/17/23 (from the past 48 hour(s))  Basic metabolic panel     Status: Abnormal   Collection Time: 05/17/23  7:54 PM  Result Value Ref Range   Sodium 134 (L) 135 - 145 mmol/L   Potassium 3.7 3.5 - 5.1 mmol/L   Chloride 97 (L) 98 - 111 mmol/L   CO2 21 (L) 22 - 32 mmol/L   Glucose, Bld 384 (H) 70 - 99 mg/dL    Comment: Glucose reference range applies only to samples taken after fasting for at least 8 hours.   BUN 16 6 - 20 mg/dL  Creatinine, Ser 0.87 0.44 - 1.00 mg/dL   Calcium 9.1 8.9 - 45.4 mg/dL   GFR, Estimated >09 >81 mL/min    Comment: (NOTE) Calculated using the CKD-EPI Creatinine Equation (2021)    Anion gap 16 (H) 5 - 15    Comment: Performed at [facility], [address] [place], [place] [address]  CBC     Status: Abnormal   Collection Time: 05/17/23  7:54 PM  Result Value Ref Range   WBC 18.8 (H) 4.0 - 10.5 K/uL   RBC 5.14 (H) 3.87 - 5.11 MIL/uL   Hemoglobin 14.7 12.0 - 15.0 g/dL   HCT 82.9 56.2 - 13.0 %   MCV 83.9 80.0 - 100.0 fL   MCH 28.6 26.0 - 34.0 pg   MCHC 34.1 30.0 - 36.0 g/dL   RDW 86.5 78.4 - 69.6 %   Platelets 273 150 - 400 K/uL   nRBC 0.0 0.0 - 0.2 %    Comment:  Performed at [facility], [address] [place], [place] [address]  Troponin I (High Sensitivity)     Status: Abnormal   Collection Time: 05/17/23  7:54 PM  Result Value Ref Range   Troponin I (High Sensitivity) 573 (HH) <18 ng/L    Comment: CRITICAL VALUE NOTED. VALUE IS CONSISTENT WITH PREVIOUSLY REPORTED/CALLED VALUE (NOTE) Elevated high sensitivity troponin I (hsTnI) values and significant  changes across serial measurements may suggest ACS but many other  chronic and acute conditions are known to elevate hsTnI results.  Refer to the "Links" section for chest pain algorithms and additional  guidance. Performed at [facility], [address], [place], [place] [address]   hCG, serum, qualitative     Status: None   Collection Time: 05/17/23  7:54 PM  Result Value Ref Range   Preg, Serum NEGATIVE NEGATIVE    Comment:        THE SENSITIVITY OF THIS METHODOLOGY IS >10 mIU/mL. Performed at [facility], [address], [place], [place] [address]   Troponin I (High Sensitivity)     Status: Abnormal   Collection Time: 05/17/23  9:46 PM  Result Value Ref Range   Troponin I (High Sensitivity) 620 (HH) <18 ng/L    Comment: CRITICAL VALUE NOTED. VALUE IS CONSISTENT WITH PREVIOUSLY REPORTED/CALLED VALUE (NOTE) Elevated high sensitivity troponin I (hsTnI) values and significant  changes across serial measurements may suggest ACS but many other  chronic and acute conditions are known to elevate hsTnI results.  Refer to the "Links" section for chest pain algorithms and additional  guidance. Performed at [facility], [address], [place], [place] [address]   Basic metabolic panel     Status: Abnormal   Collection Time: 05/18/23  2:36 AM  Result Value Ref Range   Sodium 132 (L) 135 - 145 mmol/L   Potassium 3.5 3.5 - 5.1 mmol/L   Chloride 101 98 - 111 mmol/L   CO2 20 (L) 22 - 32 mmol/L   Glucose, Bld 300 (H) 70 - 99 mg/dL    Comment: Glucose reference  range applies only to samples taken after fasting for at least 8 hours.   BUN 10 6 - 20 mg/dL   Creatinine, Ser 6.44 0.44 - 1.00 mg/dL   Calcium 8.6 (L) 8.9 - 10.3 mg/dL   GFR, Estimated >03 >47 mL/min    Comment: (NOTE) Calculated using the CKD-EPI Creatinine Equation (2021)    Anion gap 11 5 -  15    Comment: Performed at [facility], [address] [place], [place] [address]  Magnesium     Status: Abnormal   Collection Time: 05/18/23  2:36 AM  Result Value Ref Range   Magnesium 1.6 (L) 1.7 - 2.4 mg/dL    Comment: Performed at [facility], [address] [place], [place] [address]  CBC     Status: Abnormal   Collection Time: 05/18/23  2:36 AM  Result Value Ref Range   WBC 18.1 (H) 4.0 - 10.5 K/uL   RBC 5.32 (H) 3.87 - 5.11 MIL/uL   Hemoglobin 15.1 (H) 12.0 - 15.0 g/dL   HCT 09.8 11.9 - 14.7 %   MCV 82.9 80.0 - 100.0 fL   MCH 28.4 26.0 - 34.0 pg   MCHC 34.2 30.0 - 36.0 g/dL   RDW 82.9 56.2 - 13.0 %   Platelets 282 150 - 400 K/uL   nRBC 0.0 0.0 - 0.2 %    Comment: Performed at [facility], [address] [place], [place] [address]  Troponin I (High Sensitivity)     Status: Abnormal   Collection Time: 05/18/23  4:50 AM  Result Value Ref Range   Troponin I (High Sensitivity) 585 (HH) <18 ng/L    Comment: CRITICAL VALUE NOTED. VALUE IS CONSISTENT WITH PREVIOUSLY REPORTED/CALLED VALUE (NOTE) Elevated high sensitivity troponin I (hsTnI) values and significant  changes across serial measurements may suggest ACS but many other  chronic and acute conditions are known to elevate hsTnI results.  Refer to the "Links" section for chest pain algorithms and additional  guidance. Performed at [facility], [address] [place], [place] [address]   CBG monitoring, ED     Status: Abnormal   Collection Time: 05/18/23  5:00 AM  Result Value Ref Range   Glucose-Capillary 238 (H) 70 - 99 mg/dL    Comment: Glucose reference range applies only to samples taken  after fasting for at least 8 hours.  CBG monitoring, ED     Status: Abnormal   Collection Time: 05/18/23  7:59 AM  Result Value Ref Range   Glucose-Capillary 226 (H) 70 - 99 mg/dL    Comment: Glucose reference range applies only to samples taken after fasting for at least 8 hours.  Heparin level (unfractionated)     Status: Abnormal   Collection Time: 05/18/23  8:00 AM  Result Value Ref Range   Heparin Unfractionated 0.24 (L) 0.30 - 0.70 IU/mL    Comment: (NOTE) The clinical reportable range upper limit is being lowered to >1.10 to align with the FDA approved guidance for the current laboratory assay.  If heparin results are below expected values, and patient dosage has  been confirmed, suggest follow up testing of antithrombin III levels. Performed at [facility], [address], [place], [place] [address]    US SOFT TISSUE HEAD & NECK (NON-THYROID)  Result Date: 05/18/2023 CLINICAL DATA:  Facial cellulitis EXAM: ULTRASOUND OF HEAD/NECK SOFT TISSUES TECHNIQUE: Ultrasound examination of the head and neck soft tissues was performed in the area of clinical concern. COMPARISON:  None Available. FINDINGS: Along the anterior left jaw line, there is an ill-defined 2.2 x 1.3 x 2.3 cm area of somewhat heterogeneous echogenicity, which correlates with the palpable abnormality. No fluid collection or cyst is seen. No internal color flow is noted in this area. IMPRESSION: Ill-defined 2.2 x 1.3 x 2.3 cm area of somewhat heterogeneous echogenicity along the  anterior left jaw line, which correlates with the palpable abnormality. This is nonspecific, but could represent a phlegmon or developing abscess. Correlate with physical exam and consider CT maxillofacial with contrast for further evaluation if clinically indicated. Electronically Signed   By: Wiliam Ke M.D.   On: 05/18/2023 03:43   DG Chest 2 View  Result Date: 05/18/2023 CLINICAL DATA:  Atrial fibrillation and chest pain. EXAM: CHEST -  2 VIEW COMPARISON:  Portable chest 05/15/2023 FINDINGS: The heart size and mediastinal contours are within normal limits. A stent is again noted in the circumflex coronary artery. Both lungs are clear. The visualized skeletal structures are intact, with thoracic spondylosis and slight thoracic dextroscoliosis. IMPRESSION: 1. No evidence of acute chest disease or changes. 2. Circumflex coronary artery stent. Electronically Signed   By: Almira Bar M.D.   On: 05/18/2023 00:53    Pending Labs Unresulted Labs (From admission, onward)     Start     Ordered   05/19/23 0500  Heparin level (unfractionated)  Daily,   R      05/18/23 0240   05/18/23 1600  Heparin level (unfractionated)  Once-Timed,   TIMED        05/18/23 0839   05/18/23 1201  Culture, blood (Routine X 2) w Reflex to ID Panel  BLOOD CULTURE X 2,   R (with TIMED occurrences)      05/18/23 1200   05/18/23 0500  Basic metabolic panel  Daily,   R      05/18/23 0214   05/18/23 0500  CBC  Daily,   R      05/18/23 0214            Vitals/Pain Today's Vitals   05/18/23 1115 05/18/23 1130 05/18/23 1145 05/18/23 1200  BP:      Pulse: 96 97 (!) 102 (!) 101  Resp:      Temp:      TempSrc:      SpO2: 97% 97% 99% 97%  Weight:      Height:      PainSc:        Isolation Precautions No active isolations  Medications Medications  heparin ADULT infusion 100 units/mL (25000 units/228mL) (1,500 Units/hr Intravenous Rate/Dose Change 05/18/23 0840)  aspirin EC tablet 81 mg (has no administration in time range)  nitroGLYCERIN (NITROSTAT) SL tablet 0.4 mg (has no administration in time range)  metoprolol tartrate (LOPRESSOR) tablet 25 mg (25 mg Oral Given 05/18/23 0910)  atorvastatin (LIPITOR) tablet 80 mg (80 mg Oral Given 05/18/23 0911)  oxyCODONE (Oxy IR/ROXICODONE) immediate release tablet 5 mg (5 mg Oral Given 05/18/23 0250)  ondansetron (ZOFRAN) injection 4 mg (has no administration in time range)  mometasone-formoterol (DULERA)  200-5 MCG/ACT inhaler 2 puff (2 puffs Inhalation Given 05/18/23 0808)  albuterol (PROVENTIL) (2.5 MG/3ML) 0.083% nebulizer solution 3 mL (has no administration in time range)  vancomycin (VANCOREADY) IVPB 1250 mg/250 mL (has no administration in time range)  insulin glargine-yfgn (SEMGLEE) injection 20 Units (20 Units Subcutaneous Given 05/18/23 1031)  insulin aspart (novoLOG) injection 0-20 Units (has no administration in time range)  HYDROmorphone (DILAUDID) injection 0.5 mg (0.5 mg Intravenous Given 05/18/23 0910)  acetaminophen (TYLENOL) tablet 1,000 mg (has no administration in time range)  sodium chloride flush (NS) 0.9 % injection 3 mL (3 mLs Intravenous Given 05/18/23 0916)  sodium chloride flush (NS) 0.9 % injection 3 mL (has no administration in time range)  0.9 %  sodium chloride infusion (has no administration in  time range)  predniSONE (DELTASONE) tablet 50 mg (has no administration in time range)  diphenhydrAMINE (BENADRYL) capsule 50 mg (has no administration in time range)  0.9 %  sodium chloride infusion (has no administration in time range)  cefTRIAXone (ROCEPHIN) 1 g in sodium chloride 0.9 % 100 mL IVPB (0 g Intravenous Stopped 05/18/23 0200)  heparin bolus via infusion 4,000 Units (4,000 Units Intravenous Bolus from Bag 05/18/23 0130)  aspirin chewable tablet 324 mg (324 mg Oral Given 05/18/23 0252)    Or  aspirin suppository 300 mg ( Rectal See Alternative 05/18/23 0252)  vancomycin (VANCOREADY) IVPB 1500 mg/300 mL (0 mg Intravenous Stopped 05/18/23 0515)    Mobility walks     Focused Assessments    R Recommendations: See Admitting Provider Note  Report given to:   Additional Notes:

## 2023-05-18 NOTE — Plan of Care (Signed)
  Problem: Activity: Goal: Ability to return to baseline activity level will improve Outcome: Progressing   

## 2023-05-19 ENCOUNTER — Inpatient Hospital Stay (HOSPITAL_COMMUNITY): Payer: Medicare HMO

## 2023-05-19 ENCOUNTER — Inpatient Hospital Stay (HOSPITAL_COMMUNITY)
Admission: EM | Discharge status: Against Medical Advice | Payer: Self-pay | Source: Home / Self Care | Attending: Internal Medicine

## 2023-05-19 ENCOUNTER — Other Ambulatory Visit (HOSPITAL_COMMUNITY): Payer: Self-pay

## 2023-05-19 DIAGNOSIS — I5043 Acute on chronic combined systolic (congestive) and diastolic (congestive) heart failure: Secondary | ICD-10-CM | POA: Diagnosis not present

## 2023-05-19 DIAGNOSIS — I2510 Atherosclerotic heart disease of native coronary artery without angina pectoris: Secondary | ICD-10-CM | POA: Diagnosis not present

## 2023-05-19 DIAGNOSIS — L03211 Cellulitis of face: Secondary | ICD-10-CM | POA: Diagnosis not present

## 2023-05-19 DIAGNOSIS — I214 Non-ST elevation (NSTEMI) myocardial infarction: Secondary | ICD-10-CM | POA: Diagnosis not present

## 2023-05-19 HISTORY — PX: LEFT HEART CATH AND CORONARY ANGIOGRAPHY: CATH118249

## 2023-05-19 HISTORY — PX: CORONARY STENT INTERVENTION: CATH118234

## 2023-05-19 LAB — BASIC METABOLIC PANEL
Anion gap: 11 (ref 5–15)
BUN: 14 mg/dL (ref 6–20)
CO2: 20 mmol/L — ABNORMAL LOW (ref 22–32)
Calcium: 8.8 mg/dL — ABNORMAL LOW (ref 8.9–10.3)
Chloride: 102 mmol/L (ref 98–111)
Creatinine, Ser: 0.7 mg/dL (ref 0.44–1.00)
GFR, Estimated: >60 mL/min (ref >60)
Glucose, Bld: 358 mg/dL — ABNORMAL HIGH (ref 70–99)
Potassium: 4.5 mmol/L (ref 3.5–5.1)
Sodium: 133 mmol/L — ABNORMAL LOW (ref 135–145)

## 2023-05-19 LAB — CBC
HCT: 43.2 % (ref 36.0–46.0)
HCT: 45.3 % (ref 36.0–46.0)
Hemoglobin: 14.8 g/dL (ref 12.0–15.0)
Hemoglobin: 15.9 g/dL — ABNORMAL HIGH (ref 12.0–15.0)
MCH: 28.6 pg (ref 26.0–34.0)
MCH: 29.9 pg (ref 26.0–34.0)
MCHC: 34.3 g/dL (ref 30.0–36.0)
MCHC: 35.1 g/dL (ref 30.0–36.0)
MCV: 83.4 fL (ref 80.0–100.0)
MCV: 85.3 fL (ref 80.0–100.0)
Platelets: 272 10*3/uL (ref 150–400)
Platelets: 315 10*3/uL (ref 150–400)
RBC: 5.18 MIL/uL — ABNORMAL HIGH (ref 3.87–5.11)
RBC: 5.31 MIL/uL — ABNORMAL HIGH (ref 3.87–5.11)
RDW: 13.4 % (ref 11.5–15.5)
RDW: 13.5 % (ref 11.5–15.5)
WBC: 19.4 10*3/uL — ABNORMAL HIGH (ref 4.0–10.5)
WBC: 21.3 10*3/uL — ABNORMAL HIGH (ref 4.0–10.5)
nRBC: 0 % (ref 0.0–0.2)
nRBC: 0 % (ref 0.0–0.2)

## 2023-05-19 LAB — CREATININE, SERUM
Creatinine, Ser: 0.81 mg/dL (ref 0.44–1.00)
GFR, Estimated: >60 mL/min (ref >60)

## 2023-05-19 LAB — GLUCOSE, CAPILLARY
Glucose-Capillary: 316 mg/dL — ABNORMAL HIGH (ref 70–99)
Glucose-Capillary: 259 mg/dL — ABNORMAL HIGH (ref 70–99)
Glucose-Capillary: 289 mg/dL — ABNORMAL HIGH (ref 70–99)
Glucose-Capillary: 448 mg/dL — ABNORMAL HIGH (ref 70–99)
Glucose-Capillary: 507 mg/dL (ref 70–99)

## 2023-05-19 LAB — HEPARIN LEVEL (UNFRACTIONATED)
Heparin Unfractionated: 0.39 IU/mL (ref 0.30–0.70)
Heparin Unfractionated: 0.48 IU/mL (ref 0.30–0.70)

## 2023-05-19 LAB — POCT ACTIVATED CLOTTING TIME
Activated Clotting Time: 336 seconds
Activated Clotting Time: 238 seconds
Activated Clotting Time: 202 seconds
Activated Clotting Time: 159 seconds

## 2023-05-19 SURGERY — LEFT HEART CATH AND CORONARY ANGIOGRAPHY
Anesthesia: LOCAL

## 2023-05-19 MED ORDER — PREDNISONE 20 MG PO TABS: 50.0000 mg | ORAL_TABLET | Freq: Four times a day (QID) | ORAL | Status: DC

## 2023-05-19 MED ORDER — DIPHENHYDRAMINE HCL 25 MG PO CAPS: 50.0000 mg | ORAL_CAPSULE | Freq: Once | ORAL | Status: AC

## 2023-05-19 MED ORDER — MIDAZOLAM HCL 2 MG/2ML IJ SOLN
INTRAMUSCULAR | Status: DC | PRN
  Administered 2023-05-19: 1 mg via INTRAVENOUS

## 2023-05-19 MED ORDER — LIDOCAINE HCL (PF) 1 % IJ SOLN
INTRAMUSCULAR | Status: AC
  Filled 2023-05-19: qty 30

## 2023-05-19 MED ORDER — HEPARIN (PORCINE) IN NACL 1000-0.9 UT/500ML-% IV SOLN
INTRAVENOUS | Status: DC | PRN
  Administered 2023-05-19 (×2): 500 mL

## 2023-05-19 MED ORDER — DIPHENHYDRAMINE HCL 50 MG/ML IJ SOLN
50.0000 mg | Freq: Once | INTRAMUSCULAR | Status: AC
  Administered 2023-05-19: 50 mg via INTRAVENOUS
  Filled 2023-05-19: qty 1

## 2023-05-19 MED ORDER — LIDOCAINE HCL (PF) 1 % IJ SOLN
INTRAMUSCULAR | Status: DC | PRN
  Administered 2023-05-19: 5 mL

## 2023-05-19 MED ORDER — SODIUM CHLORIDE 0.9 % IV SOLN: 250.0000 mL | INTRAVENOUS | Status: DC | PRN

## 2023-05-19 MED ORDER — TICAGRELOR 90 MG PO TABS
ORAL_TABLET | ORAL | Status: DC | PRN
  Administered 2023-05-19: 180 mg via ORAL

## 2023-05-19 MED ORDER — SODIUM CHLORIDE 0.9% FLUSH: 3.0000 mL | INTRAVENOUS | Status: DC | PRN

## 2023-05-19 MED ORDER — NITROGLYCERIN 1 MG/10 ML FOR IR/CATH LAB
INTRA_ARTERIAL | Status: DC | PRN
  Administered 2023-05-19 (×2): 200 ug via INTRACORONARY

## 2023-05-19 MED ORDER — DIPHENHYDRAMINE HCL 25 MG PO CAPS: 50.0000 mg | ORAL_CAPSULE | Freq: Once | ORAL | Status: DC

## 2023-05-19 MED ORDER — SODIUM CHLORIDE 0.9 % IV SOLN: INTRAVENOUS | Status: DC

## 2023-05-19 MED ORDER — NITROGLYCERIN 1 MG/10 ML FOR IR/CATH LAB
INTRA_ARTERIAL | Status: AC
  Filled 2023-05-19: qty 10

## 2023-05-19 MED ORDER — HEPARIN SODIUM (PORCINE) 1000 UNIT/ML IJ SOLN
INTRAMUSCULAR | Status: DC | PRN
  Administered 2023-05-19: 9000 [IU] via INTRAVENOUS

## 2023-05-19 MED ORDER — VERAPAMIL HCL 2.5 MG/ML IV SOLN
INTRAVENOUS | Status: AC
  Filled 2023-05-19: qty 2

## 2023-05-19 MED ORDER — INSULIN ASPART 100 UNIT/ML IJ SOLN
12.0000 [IU] | Freq: Once | INTRAMUSCULAR | Status: AC
  Administered 2023-05-19: 12 [IU] via SUBCUTANEOUS

## 2023-05-19 MED ORDER — ASPIRIN 81 MG PO CHEW: 81.0000 mg | CHEWABLE_TABLET | Freq: Every day | ORAL | Status: DC

## 2023-05-19 MED ORDER — TICAGRELOR 90 MG PO TABS
ORAL_TABLET | ORAL | Status: AC
  Filled 2023-05-19: qty 2

## 2023-05-19 MED ORDER — ENOXAPARIN SODIUM 40 MG/0.4ML IJ SOSY
40.0000 mg | PREFILLED_SYRINGE | INTRAMUSCULAR | Status: DC
  Administered 2023-05-20 – 2023-05-21 (×2): 40 mg via SUBCUTANEOUS
  Filled 2023-05-19 (×2): qty 0.4

## 2023-05-19 MED ORDER — INSULIN ASPART 100 UNIT/ML IJ SOLN
8.0000 [IU] | Freq: Once | INTRAMUSCULAR | Status: AC
  Administered 2023-05-19: 8 [IU] via SUBCUTANEOUS

## 2023-05-19 MED ORDER — INSULIN GLARGINE-YFGN 100 UNIT/ML ~~LOC~~ SOLN
30.0000 [IU] | Freq: Every day | SUBCUTANEOUS | Status: DC
  Administered 2023-05-20 – 2023-05-21 (×2): 30 [IU] via SUBCUTANEOUS
  Filled 2023-05-19 (×2): qty 0.3

## 2023-05-19 MED ORDER — FENTANYL CITRATE (PF) 100 MCG/2ML IJ SOLN
INTRAMUSCULAR | Status: DC | PRN
  Administered 2023-05-19: 25 ug via INTRAVENOUS
  Administered 2023-05-19: 50 ug via INTRAVENOUS

## 2023-05-19 MED ORDER — IOHEXOL 350 MG/ML SOLN
75.0000 mL | Freq: Once | INTRAVENOUS | Status: AC | PRN
  Administered 2023-05-19: 75 mL via INTRAVENOUS

## 2023-05-19 MED ORDER — SODIUM CHLORIDE 0.9 % WEIGHT BASED INFUSION
1.0000 mL/kg/h | INTRAVENOUS | Status: AC
  Administered 2023-05-19: 1 mL/kg/h via INTRAVENOUS

## 2023-05-19 MED ORDER — TICAGRELOR 90 MG PO TABS
90.0000 mg | ORAL_TABLET | Freq: Two times a day (BID) | ORAL | Status: DC
  Administered 2023-05-19 – 2023-05-21 (×4): 90 mg via ORAL
  Filled 2023-05-19 (×4): qty 1

## 2023-05-19 MED ORDER — FENTANYL CITRATE (PF) 100 MCG/2ML IJ SOLN
INTRAMUSCULAR | Status: AC
  Filled 2023-05-19: qty 2

## 2023-05-19 MED ORDER — DIPHENHYDRAMINE HCL 50 MG/ML IJ SOLN: 50.0000 mg | Freq: Once | INTRAMUSCULAR | Status: DC

## 2023-05-19 MED ORDER — MIDAZOLAM HCL 2 MG/2ML IJ SOLN
INTRAMUSCULAR | Status: AC
  Filled 2023-05-19: qty 2

## 2023-05-19 MED ORDER — HYDRALAZINE HCL 20 MG/ML IJ SOLN: 10.0000 mg | INTRAMUSCULAR | Status: AC | PRN

## 2023-05-19 MED ORDER — PREDNISONE 20 MG PO TABS
50.0000 mg | ORAL_TABLET | Freq: Four times a day (QID) | ORAL | Status: AC
  Administered 2023-05-19: 50 mg via ORAL
  Filled 2023-05-19: qty 1

## 2023-05-19 MED ORDER — SODIUM CHLORIDE 0.9% FLUSH
3.0000 mL | Freq: Two times a day (BID) | INTRAVENOUS | Status: DC
  Administered 2023-05-19 – 2023-05-21 (×4): 3 mL via INTRAVENOUS

## 2023-05-19 MED ORDER — IOHEXOL 350 MG/ML SOLN
INTRAVENOUS | Status: DC | PRN
  Administered 2023-05-19: 115 mL

## 2023-05-19 SURGICAL SUPPLY — 25 items
BALLN EMERGE MR 2.0X15 (BALLOONS) ×2
BALLN EMERGE MR 2.25X15 (BALLOONS) ×1
BALLN ~~LOC~~ EMERGE MR 3.0X8 (BALLOONS) ×1
BALLOON EMERGE MR 2.0X15 (BALLOONS) IMPLANT
BALLOON EMERGE MR 2.25X15 (BALLOONS) IMPLANT
BALLOON ~~LOC~~ EMERGE MR 3.0X8 (BALLOONS) IMPLANT
CATH INFINITI 5FR MULTPACK ANG (CATHETERS) IMPLANT
CATH VISTA GUIDE 6FR XBLAD3.5 (CATHETERS) IMPLANT
GLIDESHEATH SLEND SS 6F .021 (SHEATH) IMPLANT
GUIDEWIRE INQWIRE 1.5J.035X260 (WIRE) IMPLANT
INQWIRE 1.5J .035X260CM (WIRE)
KIT ENCORE 26 ADVANTAGE (KITS) IMPLANT
KIT HEART LEFT (KITS) ×1 IMPLANT
KIT MICROPUNCTURE NIT STIFF (SHEATH) IMPLANT
PACK CARDIAC CATHETERIZATION (CUSTOM PROCEDURE TRAY) ×1 IMPLANT
SHEATH PINNACLE 5F 10CM (SHEATH) IMPLANT
SHEATH PINNACLE 6F 10CM (SHEATH) IMPLANT
SHEATH PROBE COVER 6X72 (BAG) IMPLANT
STENT SYNERGY XD 2.75X16 (Permanent Stent) IMPLANT
SYNERGY XD 2.75X16 (Permanent Stent) ×1 IMPLANT
TRANSDUCER W/STOPCOCK (MISCELLANEOUS) ×1 IMPLANT
TUBING CIL FLEX 10 FLL-RA (TUBING) ×1 IMPLANT
WIRE ASAHI PROWATER 180CM (WIRE) IMPLANT
WIRE EMERALD 3MM-J .035X150CM (WIRE) IMPLANT
WIRE RUNTHROUGH .014X180CM (WIRE) IMPLANT

## 2023-05-19 NOTE — Progress Notes (Signed)
ANTICOAGULATION CONSULT NOTE-Follow Up Pharmacy Consult for heparin Indication: chest pain/ACS  Allergies  Allergen Reactions   Iodinated Contrast Media Shortness Of Breath and Anaphylaxis   Penicillins Hives   Bupropion Itching    Pt states she has psychological reaction to generic Wellbutrin   Clindamycin/Lincomycin Nausea And Vomiting   Tetanus Toxoids Itching and Other (See Comments)    Pain/knots in arms    Azithromycin Nausea And Vomiting and Nausea Only    States she can tolerate.    Blue Dyes (Parenteral) Rash    Patient Measurements: Height: 5\' 3"  (160 cm) Weight: 88.3 kg (194 lb 11.2 oz) IBW/kg (Calculated) : 52.4 Heparin Dosing Weight: 73 kg  Vital Signs: Temp: 97.7 F (36.5 C) (07/12 0349) Temp Source: Oral (07/12 0349) BP: 107/69 (07/12 0349) Pulse Rate: 79 (07/12 0349)  Labs: Recent Labs    05/16/23 0622 05/16/23 1021 05/17/23 1954 05/17/23 2146 05/18/23 0236 05/18/23 0450 05/18/23 0800 05/18/23 1551 05/19/23 0248  HGB 15.2*  --  14.7  --  15.1*  --   --   --  14.8  HCT 44.3  --  43.1  --  44.1  --   --   --  43.2  PLT 255  --  273  --  282  --   --   --  272  HEPARINUNFRC 0.21*  --   --   --   --   --  0.24* 0.10* 0.39  CREATININE 0.78  --  0.87  --  0.69  --   --   --   --   TROPONINIHS  --    < > 573* 620*  --  585*  --   --   --    < > = values in this interval not displayed.    Estimated Creatinine Clearance: 88.7 mL/min (by C-G formula based on SCr of 0.69 mg/dL).   Medical History: Past Medical History:  Diagnosis Date   Asthma    Diabetes mellitus without complication (HCC)    Hyperlipemia     Medications:  Medications Prior to Admission  Medication Sig Dispense Refill Last Dose   acetaminophen (TYLENOL) 500 MG tablet Take 500-1,000 mg by mouth every 6 (six) hours as needed for moderate pain.   Past Month   albuterol (PROVENTIL HFA;VENTOLIN HFA) 108 (90 BASE) MCG/ACT inhaler Inhale 2 puffs into the lungs every 4 (four) hours as  needed for wheezing or shortness of breath. 1 Inhaler 1 05/17/2023   ibuprofen (ADVIL,MOTRIN) 200 MG tablet Take 800-1,200 mg by mouth 2 (two) times daily as needed for moderate pain (pain).    05/17/2023   Scheduled:   aspirin EC  81 mg Oral Daily   atorvastatin  80 mg Oral Daily   diphenhydrAMINE  50 mg Oral Once   Or   diphenhydrAMINE  50 mg Intravenous Once   insulin aspart  0-20 Units Subcutaneous TID WC   insulin aspart  0-5 Units Subcutaneous QHS   insulin glargine-yfgn  20 Units Subcutaneous Daily   metoprolol tartrate  25 mg Oral BID   mometasone-formoterol  2 puff Inhalation BID   predniSONE  50 mg Oral Q6H   sodium chloride flush  3 mL Intravenous Q12H   Assessment: 52 yoF previously admitted for c/o left-sided CP radiating to LUE and jaw associated w/ SOB, nausea, lightheadedness, and dizziness and found to be in afib RVR. Pharmacy consulted to dose heparin for ACS/STEMI. Previously left AMA, but returned for continued care. Patient  is not on anticoagulation PTA.  Heparin level 0.39- therapeutic. No s/sx of bleeding or infusion related issues  reported. Patient is scheduled for LHC today ~1330.   Goal of Therapy:  Heparin level 0.3-0.7 units/ml Monitor platelets by anticoagulation protocol: Yes   Plan:  Continue heparin to 1700 units/hr Check 6hr heparin level Monitor daily CBC, s/sx bleeding Follow up [facility]  Arabella Merles, PharmD. Clinical Pharmacist 05/19/2023 4:03 AM

## 2023-05-19 NOTE — Progress Notes (Signed)
CSW received consult for transportation resources for patient. CSW spoke with patient at bedside. CSW offered patient transportation resources. Patient accepted. All questions answered. No further questions reported at this time.Patient may need transportation assistance at dc. TOC will continue to follow.

## 2023-05-19 NOTE — Inpatient Diabetes Management (Signed)
Inpatient Diabetes Program Recommendations  AACE/ADA: New Consensus Statement on Inpatient Glycemic Control (2015)  Target Ranges:  Prepandial:   less than 140 mg/dL      Peak postprandial:   less than 180 mg/dL (1-2 hours)      Critically ill patients:  140 - 180 mg/dL   Lab Results  Component Value Date   GLUCAP 259 (H) 05/19/2023   HGBA1C 11.2 (H) 05/16/2023    Review of Glycemic Control  Latest Reference Range & Units 05/18/23 05:00 05/18/23 07:59 05/18/23 17:09 05/18/23 21:21 05/19/23 08:07 05/19/23 11:46  Glucose-Capillary 70 - 99 mg/dL 518 (H) 841 (H) 660 (H) 403 (H) 316 (H) 259 (H)   Diabetes history: New diagnosis of DM type 2  Current orders for Inpatient glycemic control:  Semglee 30 units Daily (increased today) Novolog 0-20 units tid  + hs  PO prednisone 50 mg Daily A1c 11.2%  Inpatient Diabetes Program Recommendations:    -  Also consider Novolog 5 units tid meal coverage if eating >50% of meals  Please refer to DM Coordinator note on 7/9 regarding discussion with patient regarding Diabetes diagnosis. Pt declined discussion with Diabetes Coordinator regarding the diagnosis of Diabetes. Diabetes Coordinator at the time did discuss the use and need for insulin while inpatient in the hospital while on steroids.  Thanks,  Christena Deem RN, MSN, BC-ADM Inpatient Diabetes Coordinator Team Pager [phone] (8a-5p)

## 2023-05-19 NOTE — H&P (View-Only) (Signed)
DAILY PROGRESS NOTE   Patient Name: Kathleen Marks Date of Encounter: 05/19/2023 Cardiologist: None  Chief Complaint   No chest pain  Patient Profile   52 yo female with recent NSTEMI or possible missed-STEMI, found to have LVEF 30-35% with LAD territory WMA, planned for cath, but left AMA 2 days ago, now returns with chest pain and facial swelling/cellulitis   Subjective   Needed contrast dye prophylaxis - plan for LHC today.   Objective   Vitals:   05/19/23 0153 05/19/23 0338 05/19/23 0349 05/19/23 0810  BP:  101/69 107/69 113/79  Pulse: 65 80 79 82  Resp: 14 15 13 18   Temp:  98.7 F (37.1 C) 97.7 F (36.5 C) 97.7 F (36.5 C)  TempSrc:  Oral Oral Oral  SpO2: 97% 95% 96% 95%  Weight:      Height:        Intake/Output Summary (Last 24 hours) at 05/19/2023 0837 Last data filed at 05/19/2023 0600 Gross per 24 hour  Intake 1167.95 ml  Output 300 ml  Net 867.95 ml   Filed Weights   05/17/23 1946 05/18/23 1517  Weight: 90.7 kg 88.3 kg    Physical Exam   General appearance: alert and no distress Lungs: clear to auscultation bilaterally Heart: regular rate and rhythm, S1, S2 normal, no murmur, click, rub or gallop Extremities: extremities normal, atraumatic, no cyanosis or edema Neurologic: Grossly normal  Inpatient Medications    Scheduled Meds:  aspirin EC  81 mg Oral Daily   atorvastatin  80 mg Oral Daily   diphenhydrAMINE  50 mg Oral Once   Or   diphenhydrAMINE  50 mg Intravenous Once   insulin aspart  0-20 Units Subcutaneous TID WC   insulin aspart  0-5 Units Subcutaneous QHS   insulin glargine-yfgn  20 Units Subcutaneous Daily   metoprolol tartrate  25 mg Oral BID   mometasone-formoterol  2 puff Inhalation BID   predniSONE  50 mg Oral Q6H   sodium chloride flush  3 mL Intravenous Q12H    Continuous Infusions:  sodium chloride     sodium chloride 10 mL/hr at 05/19/23 0600   sodium chloride 10 mL/hr at 05/19/23 0600   heparin 1,700  Units/hr (05/19/23 0715)   vancomycin Stopped (05/18/23 2241)    PRN Meds: sodium chloride, acetaminophen, albuterol, HYDROmorphone (DILAUDID) injection, nitroGLYCERIN, ondansetron (ZOFRAN) IV, oxyCODONE, sodium chloride flush   Labs   Results for orders placed or performed during the hospital encounter of 05/17/23 (from the past 48 hour(s))  Basic metabolic panel     Status: Abnormal   Collection Time: 05/17/23  7:54 PM  Result Value Ref Range   Sodium 134 (L) 135 - 145 mmol/L   Potassium 3.7 3.5 - 5.1 mmol/L   Chloride 97 (L) 98 - 111 mmol/L   CO2 21 (L) 22 - 32 mmol/L   Glucose, Bld 384 (H) 70 - 99 mg/dL    Comment: Glucose reference range applies only to samples taken after fasting for at least 8 hours.   BUN 16 6 - 20 mg/dL   Creatinine, Ser 1.30 0.44 - 1.00 mg/dL   Calcium 9.1 8.9 - 86.5 mg/dL   GFR, Estimated >78 >46 mL/min    Comment: (NOTE) Calculated using the CKD-EPI Creatinine Equation (2021)    Anion gap 16 (H) 5 - 15    Comment: Performed at [facility], [address] [place], [place] [address]  CBC     Status:  Abnormal   Collection Time: 05/17/23  7:54 PM  Result Value Ref Range   WBC 18.8 (H) 4.0 - 10.5 K/uL   RBC 5.14 (H) 3.87 - 5.11 MIL/uL   Hemoglobin 14.7 12.0 - 15.0 g/dL   HCT 91.4 78.2 - 95.6 %   MCV 83.9 80.0 - 100.0 fL   MCH 28.6 26.0 - 34.0 pg   MCHC 34.1 30.0 - 36.0 g/dL   RDW 21.3 08.6 - 57.8 %   Platelets 273 150 - 400 K/uL   nRBC 0.0 0.0 - 0.2 %    Comment: Performed at [facility], [address] [place], [place] [address]  Troponin I (High Sensitivity)     Status: Abnormal   Collection Time: 05/17/23  7:54 PM  Result Value Ref Range   Troponin I (High Sensitivity) 573 (HH) <18 ng/L    Comment: CRITICAL VALUE NOTED. VALUE IS CONSISTENT WITH PREVIOUSLY REPORTED/CALLED VALUE (NOTE) Elevated high sensitivity troponin I (hsTnI) values and significant  changes across serial measurements may suggest ACS but many other  chronic  and acute conditions are known to elevate hsTnI results.  Refer to the "Links" section for chest pain algorithms and additional  guidance. Performed at [facility], [address] [place], [place] [address]   hCG, serum, qualitative     Status: None   Collection Time: 05/17/23  7:54 PM  Result Value Ref Range   Preg, Serum NEGATIVE NEGATIVE    Comment:        THE SENSITIVITY OF THIS METHODOLOGY IS >10 mIU/mL. Performed at [facility], [address] [place], [place] [address]   Troponin I (High Sensitivity)     Status: Abnormal   Collection Time: 05/17/23  9:46 PM  Result Value Ref Range   Troponin I (High Sensitivity) 620 (HH) <18 ng/L    Comment: CRITICAL VALUE NOTED. VALUE IS CONSISTENT WITH PREVIOUSLY REPORTED/CALLED VALUE (NOTE) Elevated high sensitivity troponin I (hsTnI) values and significant  changes across serial measurements may suggest ACS but many other  chronic and acute conditions are known to elevate hsTnI results.  Refer to the "Links" section for chest pain algorithms and additional  guidance. Performed at [facility], [address], [place], [place] [address]   Basic metabolic panel     Status: Abnormal   Collection Time: 05/18/23  2:36 AM  Result Value Ref Range   Sodium 132 (L) 135 - 145 mmol/L   Potassium 3.5 3.5 - 5.1 mmol/L   Chloride 101 98 - 111 mmol/L   CO2 20 (L) 22 - 32 mmol/L   Glucose, Bld 300 (H) 70 - 99 mg/dL    Comment: Glucose reference range applies only to samples taken after fasting for at least 8 hours.   BUN 10 6 - 20 mg/dL   Creatinine, Ser 2.53 0.44 - 1.00 mg/dL   Calcium 8.6 (L) 8.9 - 10.3 mg/dL   GFR, Estimated >66 >44 mL/min    Comment: (NOTE) Calculated using the CKD-EPI Creatinine Equation (2021)    Anion gap 11 5 - 15    Comment: Performed at [facility], [address], [place], [place] [address]  Magnesium     Status: Abnormal   Collection Time: 05/18/23  2:36 AM  Result Value Ref Range    Magnesium 1.6 (L) 1.7 - 2.4 mg/dL    Comment: Performed at [facility], [address], [place], [place] [address]  CBC  Status: Abnormal   Collection Time: 05/18/23  2:36 AM  Result Value Ref Range   WBC 18.1 (H) 4.0 - 10.5 K/uL   RBC 5.32 (H) 3.87 - 5.11 MIL/uL   Hemoglobin 15.1 (H) 12.0 - 15.0 g/dL   HCT 60.4 54.0 - 98.1 %   MCV 82.9 80.0 - 100.0 fL   MCH 28.4 26.0 - 34.0 pg   MCHC 34.2 30.0 - 36.0 g/dL   RDW 19.1 47.8 - 29.5 %   Platelets 282 150 - 400 K/uL   nRBC 0.0 0.0 - 0.2 %    Comment: Performed at [facility] [place] [facility], [address] [place], [place] [address]  Troponin I (High Sensitivity)     Status: Abnormal   Collection Time: 05/18/23  4:50 AM  Result Value Ref Range   Troponin I (High Sensitivity) 585 (HH) <18 ng/L    Comment: CRITICAL VALUE NOTED. VALUE IS CONSISTENT WITH PREVIOUSLY REPORTED/CALLED VALUE (NOTE) Elevated high sensitivity troponin I (hsTnI) values and significant  changes across serial measurements may suggest ACS but many other  chronic and acute conditions are known to elevate hsTnI results.  Refer to the "Links" section for chest pain algorithms and additional  guidance. Performed at [facility], [address] [place], [place] [address]   CBG monitoring, ED     Status: Abnormal   Collection Time: 05/18/23  5:00 AM  Result Value Ref Range   Glucose-Capillary 238 (H) 70 - 99 mg/dL    Comment: Glucose reference range applies only to samples taken after fasting for at least 8 hours.  CBG monitoring, ED     Status: Abnormal   Collection Time: 05/18/23  7:59 AM  Result Value Ref Range   Glucose-Capillary 226 (H) 70 - 99 mg/dL    Comment: Glucose reference range applies only to samples taken after fasting for at least 8 hours.  Heparin level (unfractionated)     Status: Abnormal   Collection Time: 05/18/23  8:00 AM  Result Value Ref Range   Heparin Unfractionated 0.24 (L) 0.30 - 0.70 IU/mL    Comment: (NOTE) The clinical  reportable range upper limit is being lowered to >1.10 to align with the FDA approved guidance for the current laboratory assay.  If heparin results are below expected values, and patient dosage has  been confirmed, suggest follow up testing of antithrombin III levels. Performed at [facility], [address] [place], [place] [address]   Heparin level (unfractionated)     Status: Abnormal   Collection Time: 05/18/23  3:51 PM  Result Value Ref Range   Heparin Unfractionated 0.10 (L) 0.30 - 0.70 IU/mL    Comment: (NOTE) The clinical reportable range upper limit is being lowered to >1.10 to align with the FDA approved guidance for the current laboratory assay.  If heparin results are below expected values, and patient dosage has  been confirmed, suggest follow up testing of antithrombin III levels. Performed at [facility], [address], [place], [place] [address]   Glucose, capillary     Status: Abnormal   Collection Time: 05/18/23  5:09 PM  Result Value Ref Range   Glucose-Capillary 266 (H) 70 - 99 mg/dL    Comment: Glucose reference range applies only to samples taken after fasting for at least 8 hours.  Glucose, capillary     Status: Abnormal   Collection Time: 05/18/23  9:21 PM  Result Value Ref Range   Glucose-Capillary 403 (H) 70 -  99 mg/dL    Comment: Glucose reference range applies only to samples taken after fasting for at least 8 hours.  Basic metabolic panel     Status: Abnormal   Collection Time: 05/19/23  2:48 AM  Result Value Ref Range   Sodium 133 (L) 135 - 145 mmol/L   Potassium 4.5 3.5 - 5.1 mmol/L   Chloride 102 98 - 111 mmol/L   CO2 20 (L) 22 - 32 mmol/L   Glucose, Bld 358 (H) 70 - 99 mg/dL    Comment: Glucose reference range applies only to samples taken after fasting for at least 8 hours.   BUN 14 6 - 20 mg/dL   Creatinine, Ser 4.09 0.44 - 1.00 mg/dL   Calcium 8.8 (L) 8.9 - 10.3 mg/dL   GFR, Estimated >81 >19 mL/min    Comment:  (NOTE) Calculated using the CKD-EPI Creatinine Equation (2021)    Anion gap 11 5 - 15    Comment: Performed at [facility], [address] [place], [place] [address]  CBC     Status: Abnormal   Collection Time: 05/19/23  2:48 AM  Result Value Ref Range   WBC 19.4 (H) 4.0 - 10.5 K/uL   RBC 5.18 (H) 3.87 - 5.11 MIL/uL   Hemoglobin 14.8 12.0 - 15.0 g/dL   HCT 95.6 21.3 - 08.6 %   MCV 83.4 80.0 - 100.0 fL   MCH 28.6 26.0 - 34.0 pg   MCHC 34.3 30.0 - 36.0 g/dL   RDW 57.8 46.9 - 62.9 %   Platelets 272 150 - 400 K/uL   nRBC 0.0 0.0 - 0.2 %    Comment: Performed at [facility], [address], [place], [place] [address]  Heparin level (unfractionated)     Status: None   Collection Time: 05/19/23  2:48 AM  Result Value Ref Range   Heparin Unfractionated 0.39 0.30 - 0.70 IU/mL    Comment: (NOTE) The clinical reportable range upper limit is being lowered to >1.10 to align with the FDA approved guidance for the current laboratory assay.  If heparin results are below expected values, and patient dosage has  been confirmed, suggest follow up testing of antithrombin III levels. Performed at [facility], [address] [place], [place] [address]   Glucose, capillary     Status: Abnormal   Collection Time: 05/19/23  8:07 AM  Result Value Ref Range   Glucose-Capillary 316 (H) 70 - 99 mg/dL    Comment: Glucose reference range applies only to samples taken after fasting for at least 8 hours.    ECG   NSR at 83 - Personally Reviewed  Telemetry   Sinus rhythm - Personally Reviewed  Radiology    CT MAXILLOFACIAL W CONTRAST  Result Date: 05/19/2023 CLINICAL DATA:  52 year old female with face cellulitis. Possible developing abscess along the left jawline on ultrasound yesterday. EXAM: CT MAXILLOFACIAL WITH CONTRAST TECHNIQUE: Multidetector CT imaging of the maxillofacial structures was performed with intravenous contrast. Multiplanar CT image reconstructions were  also generated. RADIATION DOSE REDUCTION: This exam was performed according to the departmental dose-optimization program which includes automated exposure control, adjustment of the mA and/or kV according to patient size and/or use of iterative reconstruction technique. CONTRAST:  75mL OMNIPAQUE IOHEXOL 350 MG/ML SOLN status post 13 hour steroid premedication. No adverse events recorded. COMPARISON:  Head and neck ultrasound yesterday. FINDINGS: Osseous: Absent dentition. Mandible intact and normally located. Bilateral maxilla, zygoma, pterygoid, and nasal bones appear  intact. Intact central skull base. Visible cervical vertebrae appear intact and aligned, with bulky anterior C4-C5 endplate osteophytosis. Visible calvarium intact. Orbits: Intact orbital walls. Globes and intraorbital soft tissues appears symmetric and normal. Sinuses: Clear throughout. Soft tissues: Negative visible thyroid, larynx, pharynx, parapharyngeal spaces, retropharyngeal space. Sublingual space, right side submandibular, mastoid and parotid spaces are normal. On the left side there is superficial soft tissue swelling and stranding as well as nodular phlegmon lateral to the body of the left mandible. See series 3, image 65 and also coronal image 34. No soft tissue gas. No organized or drainable fluid collection there. Associated thickening of the left platysma and mild edema tracking into the left submandibular space. The submandibular gland appears to remain symmetric. Inflammation also tracks up the left masticator space to the level of the left parotid duct which is nondilated. The left parotid space appear spared. Major vascular structures in the bilateral neck and at the skull base appear to be enhancing and patent. Minimal cervical lymph node asymmetry on the left. No heterogeneous lymph nodes. Limited intracranial: Negative. IMPRESSION: 1. Left Facial Cellulitis with nodular phlegmon lateral to the body of the left mandible.  Secondary inflammation in the left submandibular and masticator spaces. But no abscess or drainable fluid. No other complicating features. 2. Absent dentition. Electronically Signed   By: Odessa Fleming M.D.   On: 05/19/2023 05:08   US SOFT TISSUE HEAD & NECK (NON-THYROID)  Result Date: 05/18/2023 CLINICAL DATA:  Facial cellulitis EXAM: ULTRASOUND OF HEAD/NECK SOFT TISSUES TECHNIQUE: Ultrasound examination of the head and neck soft tissues was performed in the area of clinical concern. COMPARISON:  None Available. FINDINGS: Along the anterior left jaw line, there is an ill-defined 2.2 x 1.3 x 2.3 cm area of somewhat heterogeneous echogenicity, which correlates with the palpable abnormality. No fluid collection or cyst is seen. No internal color flow is noted in this area. IMPRESSION: Ill-defined 2.2 x 1.3 x 2.3 cm area of somewhat heterogeneous echogenicity along the anterior left jaw line, which correlates with the palpable abnormality. This is nonspecific, but could represent a phlegmon or developing abscess. Correlate with physical exam and consider CT maxillofacial with contrast for further evaluation if clinically indicated. Electronically Signed   By: Wiliam Ke M.D.   On: 05/18/2023 03:43   DG Chest 2 View  Result Date: 05/18/2023 CLINICAL DATA:  Atrial fibrillation and chest pain. EXAM: CHEST - 2 VIEW COMPARISON:  Portable chest 05/15/2023 FINDINGS: The heart size and mediastinal contours are within normal limits. A stent is again noted in the circumflex coronary artery. Both lungs are clear. The visualized skeletal structures are intact, with thoracic spondylosis and slight thoracic dextroscoliosis. IMPRESSION: 1. No evidence of acute chest disease or changes. 2. Circumflex coronary artery stent. Electronically Signed   By: Almira Bar M.D.   On: 05/18/2023 00:53    Cardiac Studies   N/A  Assessment   Principal Problem:   Facial cellulitis Active Problems:   CAD S/P percutaneous coronary  angioplasty   Type 2 diabetes mellitus (HCC)   Non-ST elevation (NSTEMI) myocardial infarction (HCC)   Acute on chronic combined systolic and diastolic CHF (congestive heart failure) (HCC)   Plan   Pre-medicated for contrast dye allergy. Plan for LHC today. No further chest pain. Vitals stable - afebrile on antibiotics for facial cellulitis without abscess. Titrate HF meds for GDMT post-cath over the weekend.  Time Spent Directly with Patient:  I have spent a total of 25 minutes with  the patient reviewing hospital notes, telemetry, EKGs, labs and examining the patient as well as establishing an assessment and plan that was discussed personally with the patient.  > 50% of time was spent in direct patient care.  Length of Stay:  LOS: 1 day   [name], MD, [facility], FACP    [facility]  Medical Director of the [facility] Lipid [facility]  Cardiovascular Risk Reduction Clinic Diplomate of the American Board of Clinical Lipidology Attending Cardiologist  Direct Dial: [phone]  Fax: [phone]  Website:  [email or web address] 05/19/2023, 8:37 AM

## 2023-05-19 NOTE — Progress Notes (Signed)
Site area- right  Site Prior to Removal- 0   Pressure Applied For-  35 MInutes   Bedrest Beginning at - 1725   Manual- Yes   Patient Status During Pull- Stable    Post Pull Groin Site- 0   Post Pull Instructions Given- Yes   Post Pull Pulses Present- Yes    Dressing Applied- Tegaderm and Gauze Dressing    Comments:  Site CDI.

## 2023-05-19 NOTE — Progress Notes (Addendum)
TRH night cross cover note:   I was notified by RN that the patient's nightly CBG is 448.  I ordered  novolog 8 units sq x 1 dose now. I also ordered a repeat cbg for to be checked approximately 1 hour after receipt of this novolog , and asked to be notified if this next cbg result remains  greater than 400.    Update: Repeat CBG 507.  RN conveys that the patient has been consuming juice as well as coffee with sugar in spite of patient education provided by multiple RN's.  I subsequently ordered NovoLog 12 units subcu x 1 dose as well as a repeat CBG to be checked in approximately 1 hour.   Newton Pigg, DO Hospitalist

## 2023-05-19 NOTE — Interval H&P Note (Signed)
History and Physical Interval Note:  05/19/2023 1:42 PM  Kathleen Marks  has presented today for surgery, with the diagnosis of nstemi.  The various methods of treatment have been discussed with the patient and family. After consideration of risks, benefits and other options for treatment, the patient has consented to  Procedure(s): LEFT HEART CATH AND CORONARY ANGIOGRAPHY (N/A) as a surgical intervention.  The patient's history has been reviewed, patient examined, no change in status, stable for surgery.  I have reviewed the patient's chart and labs.  Questions were answered to the patient's satisfaction.   Cath Lab Visit (complete for each Cath Lab visit)  Clinical Evaluation Leading to the Procedure:   ACS: Yes.    Non-ACS:    Anginal Classification: CCS IV  Anti-ischemic medical therapy: No Therapy  Non-Invasive Test Results: No non-invasive testing performed  Prior CABG: No previous CABG        Theron Arista [facility] 05/19/2023 1:43 PM

## 2023-05-19 NOTE — TOC CM/SW Note (Signed)
Transition of Care [facility]) - Inpatient Brief Assessment   Patient Details  Name: Kathleen Marks MRN: [id number] Date of Birth: 1971-06-16  Transition of Care [facility]) CM/SW Contact:    Gala Lewandowsky, RN Phone Number: 05/19/2023, 1:27 PM   Clinical Narrative: Patient presented for facial pain and swelling. Patient is currently on IV antibiotics. Case Manager will continue to follow for transition of care needs as the patient progresses. .    Transition of Care Asessment: Insurance and Status: Insurance coverage has been reviewed Patient has primary care physician: Yes Prior/Current Home Services: No current home services Social Determinants of Health Reivew: SDOH reviewed no interventions necessary Readmission risk has been reviewed: Yes Transition of care needs: transition of care needs identified, TOC will continue to follow

## 2023-05-19 NOTE — Progress Notes (Signed)
PROGRESS NOTE  Kathleen Marks  ZOX:[id number] DOB: 05-08-1971 DOA: 05/17/2023 PCP: Glenna Durand, PA-C   Brief Narrative: Patient is a 52 year old female with history of poorly controlled diabetes, asthma, hyperlipidemia, coronary artery disease with PCI and stent placement, recent admission for suspected acute MI but left AMA before left heart cath presented back with complaint of left lower facial swelling, pain.  She was admitted here 3 days ago for chest pain, elevated troponin, echo showed EF of 35%, severe hypokinesis.  She was bradycardic but she left AMA to take care of her children. On presentation she was afebrile but blood pressure was elevated, EKG showed sinus tachycardia.  Chest x-ray did not show pneumonia.  Troponins were elevated, WC count of 18,000.  Patient was started on Rocephin for facial cellulitis and also started on IV heparin for NSTEMI.  Cardiology consulted.Plan for cath    Assessment & Plan:  Principal Problem:   Facial cellulitis Active Problems:   CAD S/P percutaneous coronary angioplasty   Type 2 diabetes mellitus (HCC)   Non-ST elevation (NSTEMI) myocardial infarction (HCC)   Acute on chronic combined systolic and diastolic CHF (congestive heart failure) (HCC)   NSTEMI: Scenario as above.  Elevated troponin.  On IV heparin.  Cardiology following.  Plan for cath.  Denies any chest pain today.   Facial cellulitis/abscess: Currently afebrile. Has moderate leucocytosis.  Ultrasound of the face showed 2.2 x 1.3 x 2.3 cm on anterior left jaw.  ENT consulted,discussed with Dr Jenne Pane.  Dr. Ellery Plunk recommended CT maxillofacial with contrast which did not show any drainable abscess but showed nodular phlegmon.  Cellulitis improving.  Continue vancomycin for now   Type 2 diabetes: Uncontrolled.  Recent  A1c of 11.2.  Continue current insulin regimen.  Monitor blood sugars.  Consulted  diabetic coordinator.  Patient denies that her diabetes is false.  She states  she does not have any diabetes despite her hemoglobin A1c of more than 10.  She states the medications that the doctors provided developed the diabetes   Hyperlipidemia: On statin   Hypomagnesemia: Supplemented with magnesium   Hypertension: Continue current medication.  On metoprolol   Asthma: Currently not in exacerbation.  Continue bronchodilators   Obesity: BMI of 34.4       DVT prophylaxis:IV heparin     Code Status: Full Code  Family Communication: None at bedside  Patient status:Inpatient  Patient is from :Home  Anticipated discharge WJ:XBJY  Estimated DC date:1-2 days,after full cardiac work up   Consultants: Cardiology,ENT  Procedures:None  Antimicrobials:  Anti-infectives (From admission, onward)    Start     Dose/Rate Route Frequency Ordered Stop   05/18/23 2200  Vancomycin (VANCOCIN) 1,250 mg in sodium chloride 0.9 % 250 mL IVPB  Status:  Discontinued        1,250 mg 166.7 mL/hr over 90 Minutes Intravenous Every 24 hours 05/18/23 0311 05/18/23 0311   05/18/23 2200  vancomycin (VANCOREADY) IVPB 1250 mg/250 mL        1,250 mg 166.7 mL/hr over 90 Minutes Intravenous Every 24 hours 05/18/23 0311     05/18/23 0230  vancomycin (VANCOREADY) IVPB 1500 mg/300 mL        1,500 mg 150 mL/hr over 120 Minutes Intravenous Once 05/18/23 0221 05/18/23 0515   05/18/23 0215  vancomycin (VANCOCIN) IVPB 1000 mg/200 mL premix  Status:  Discontinued        1,000 mg 200 mL/hr over 60 Minutes Intravenous  Once 05/18/23 0214 05/18/23 0221  05/18/23 0100  cefTRIAXone (ROCEPHIN) 1 g in sodium chloride 0.9 % 100 mL IVPB        1 g 200 mL/hr over 30 Minutes Intravenous  Once 05/18/23 0057 05/18/23 0200       Subjective: Patient seen and examined at bedside today.  She looks much better today.  Left face pain is better.  Cellulitis area on the left mandible looks better.  Denies any chest pain today.  Hemodynamically stable  Objective: Vitals:   05/19/23 0153 05/19/23  0338 05/19/23 0349 05/19/23 0810  BP:  101/69 107/69 113/79  Pulse: 65 80 79 82  Resp: 14 15 13 18   Temp:  98.7 F (37.1 C) 97.7 F (36.5 C) 97.7 F (36.5 C)  TempSrc:  Oral Oral Oral  SpO2: 97% 95% 96% 95%  Weight:      Height:        Intake/Output Summary (Last 24 hours) at 05/19/2023 1104 Last data filed at 05/19/2023 0600 Gross per 24 hour  Intake 1167.95 ml  Output 300 ml  Net 867.95 ml   Filed Weights   05/17/23 1946 05/18/23 1517  Weight: 90.7 kg 88.3 kg    Examination:  General exam: Overall comfortable, not in distress,obese HEENT: PERRL, nodular  cellulitic area on the left face Respiratory system:  no wheezes or crackles  Cardiovascular system: S1 & S2 heard, RRR.  Gastrointestinal system: Abdomen is nondistended, soft and nontender. Central nervous system: Alert and oriented Extremities: No edema, no clubbing ,no cyanosis Skin: No rashes, no ulcers,no icterus     Data Reviewed: I have personally reviewed following labs and imaging studies  CBC: Recent Labs  Lab 05/15/23 2058 05/16/23 0622 05/17/23 1954 05/18/23 0236 05/19/23 0248  WBC 11.7* 13.4* 18.8* 18.1* 19.4*  HGB 15.8* 15.2* 14.7 15.1* 14.8  HCT 45.2 44.3 43.1 44.1 43.2  MCV 82.6 83.7 83.9 82.9 83.4  PLT 280 255 273 282 272   Basic Metabolic Panel: Recent Labs  Lab 05/15/23 2058 05/16/23 0622 05/17/23 1954 05/18/23 0236 05/19/23 0248  NA 130* 132* 134* 132* 133*  K 3.6 4.7 3.7 3.5 4.5  CL 96* 101 97* 101 102  CO2 21* 20* 21* 20* 20*  GLUCOSE 357* 485* 384* 300* 358*  BUN 9 11 16 10 14   CREATININE 0.81 0.78 0.87 0.69 0.70  CALCIUM 9.0 8.6* 9.1 8.6* 8.8*  MG  --   --   --  1.6*  --      Recent Results (from the past 240 hour(s))  MRSA Next Gen by PCR, Nasal     Status: Abnormal   Collection Time: 05/16/23 12:28 AM   Specimen: Nasal Mucosa; Nasal Swab  Result Value Ref Range Status   MRSA by PCR Next Gen DETECTED (A) NOT DETECTED Final    Comment: RESULT CALLED TO, READ BACK  BY AND VERIFIED WITH: A COOPER,RN@0231  05/16/23 MK (NOTE) The GeneXpert MRSA Assay (FDA approved for NASAL specimens only), is one component of a comprehensive MRSA colonization surveillance program. It is not intended to diagnose MRSA infection nor to guide or monitor treatment for MRSA infections. Test performance is not FDA approved in patients less than 62 years old. Performed at [facility], [address], [place], [place] [address]   Culture, blood (Routine X 2) w Reflex to ID Panel     Status: None (Preliminary result)   Collection Time: 05/18/23 12:48 PM   Specimen: BLOOD LEFT HAND  Result Value Ref Range Status  Specimen Description BLOOD LEFT HAND  Final   Special Requests   Final    BOTTLES DRAWN AEROBIC AND ANAEROBIC Blood Culture adequate volume   Culture   Final    NO GROWTH < 24 HOURS Performed at [facility], [address], [place], [place] [address]    Report Status PENDING  Incomplete  Culture, blood (Routine X 2) w Reflex to ID Panel     Status: None (Preliminary result)   Collection Time: 05/18/23 12:48 PM   Specimen: BLOOD RIGHT HAND  Result Value Ref Range Status   Specimen Description BLOOD RIGHT HAND  Final   Special Requests   Final    BOTTLES DRAWN AEROBIC AND ANAEROBIC Blood Culture results may not be optimal due to an excessive volume of blood received in culture bottles   Culture   Final    NO GROWTH < 24 HOURS Performed at [facility], [address] [place], [place] [address]    Report Status PENDING  Incomplete     Radiology Studies: CT MAXILLOFACIAL W CONTRAST  Result Date: 05/19/2023 CLINICAL DATA:  52 year old female with face cellulitis. Possible developing abscess along the left jawline on ultrasound yesterday. EXAM: CT MAXILLOFACIAL WITH CONTRAST TECHNIQUE: Multidetector CT imaging of the maxillofacial structures was performed with intravenous contrast. Multiplanar CT image reconstructions were also generated.  RADIATION DOSE REDUCTION: This exam was performed according to the departmental dose-optimization program which includes automated exposure control, adjustment of the mA and/or kV according to patient size and/or use of iterative reconstruction technique. CONTRAST:  75mL OMNIPAQUE IOHEXOL 350 MG/ML SOLN status post 13 hour steroid premedication. No adverse events recorded. COMPARISON:  Head and neck ultrasound yesterday. FINDINGS: Osseous: Absent dentition. Mandible intact and normally located. Bilateral maxilla, zygoma, pterygoid, and nasal bones appear intact. Intact central skull base. Visible cervical vertebrae appear intact and aligned, with bulky anterior C4-C5 endplate osteophytosis. Visible calvarium intact. Orbits: Intact orbital walls. Globes and intraorbital soft tissues appears symmetric and normal. Sinuses: Clear throughout. Soft tissues: Negative visible thyroid, larynx, pharynx, parapharyngeal spaces, retropharyngeal space. Sublingual space, right side submandibular, mastoid and parotid spaces are normal. On the left side there is superficial soft tissue swelling and stranding as well as nodular phlegmon lateral to the body of the left mandible. See series 3, image 65 and also coronal image 34. No soft tissue gas. No organized or drainable fluid collection there. Associated thickening of the left platysma and mild edema tracking into the left submandibular space. The submandibular gland appears to remain symmetric. Inflammation also tracks up the left masticator space to the level of the left parotid duct which is nondilated. The left parotid space appear spared. Major vascular structures in the bilateral neck and at the skull base appear to be enhancing and patent. Minimal cervical lymph node asymmetry on the left. No heterogeneous lymph nodes. Limited intracranial: Negative. IMPRESSION: 1. Left Facial Cellulitis with nodular phlegmon lateral to the body of the left mandible. Secondary inflammation  in the left submandibular and masticator spaces. But no abscess or drainable fluid. No other complicating features. 2. Absent dentition. Electronically Signed   By: Odessa Fleming M.D.   On: 05/19/2023 05:08   US SOFT TISSUE HEAD & NECK (NON-THYROID)  Result Date: 05/18/2023 CLINICAL DATA:  Facial cellulitis EXAM: ULTRASOUND OF HEAD/NECK SOFT TISSUES TECHNIQUE: Ultrasound examination of the head and neck soft tissues was performed in the area of clinical concern. COMPARISON:  None Available. FINDINGS: Along the anterior left jaw  line, there is an ill-defined 2.2 x 1.3 x 2.3 cm area of somewhat heterogeneous echogenicity, which correlates with the palpable abnormality. No fluid collection or cyst is seen. No internal color flow is noted in this area. IMPRESSION: Ill-defined 2.2 x 1.3 x 2.3 cm area of somewhat heterogeneous echogenicity along the anterior left jaw line, which correlates with the palpable abnormality. This is nonspecific, but could represent a phlegmon or developing abscess. Correlate with physical exam and consider CT maxillofacial with contrast for further evaluation if clinically indicated. Electronically Signed   By: Wiliam Ke M.D.   On: 05/18/2023 03:43   DG Chest 2 View  Result Date: 05/18/2023 CLINICAL DATA:  Atrial fibrillation and chest pain. EXAM: CHEST - 2 VIEW COMPARISON:  Portable chest 05/15/2023 FINDINGS: The heart size and mediastinal contours are within normal limits. A stent is again noted in the circumflex coronary artery. Both lungs are clear. The visualized skeletal structures are intact, with thoracic spondylosis and slight thoracic dextroscoliosis. IMPRESSION: 1. No evidence of acute chest disease or changes. 2. Circumflex coronary artery stent. Electronically Signed   By: Almira Bar M.D.   On: 05/18/2023 00:53    Scheduled Meds:  aspirin EC  81 mg Oral Daily   atorvastatin  80 mg Oral Daily   diphenhydrAMINE  50 mg Oral Once   Or   diphenhydrAMINE  50 mg  Intravenous Once   insulin aspart  0-20 Units Subcutaneous TID WC   insulin aspart  0-5 Units Subcutaneous QHS   insulin glargine-yfgn  20 Units Subcutaneous Daily   metoprolol tartrate  25 mg Oral BID   mometasone-formoterol  2 puff Inhalation BID   predniSONE  50 mg Oral Q6H   sodium chloride flush  3 mL Intravenous Q12H   Continuous Infusions:  sodium chloride     sodium chloride 10 mL/hr at 05/19/23 0600   sodium chloride 10 mL/hr at 05/19/23 0600   heparin 1,700 Units/hr (05/19/23 0715)   vancomycin Stopped (05/18/23 2241)     LOS: 1 day   Burnadette Pop, MD [facility] P7/10/2023, 11:04 AM

## 2023-05-19 NOTE — Progress Notes (Signed)
  Pt arriving to unit appearing up set. Pt informed that she will have to stay in holding until sheath in groin is ready to be pulled. Pt becoming upset that she wasn't informed that she will have to be in another room until her sheath is removed. PT crying stating that she needs her phone to call her son. That she doesn't want to be on this unit not compliant with instructions to remain still before sheath is removed. PT informed that we will get her phone so that she can contact her son. Pt attempting to get out of bed. MD made aware.

## 2023-05-19 NOTE — Progress Notes (Signed)
ANTICOAGULATION CONSULT NOTE-Follow Up Pharmacy Consult for heparin Indication: chest pain/ACS  Allergies  Allergen Reactions   Iodinated Contrast Media Shortness Of Breath and Anaphylaxis   Penicillins Hives   Bupropion Itching    Pt states she has psychological reaction to generic Wellbutrin   Clindamycin/Lincomycin Nausea And Vomiting   Tetanus Toxoids Itching and Other (See Comments)    Pain/knots in arms    Azithromycin Nausea And Vomiting and Nausea Only    States she can tolerate.    Blue Dyes (Parenteral) Rash    Patient Measurements: Height: 5\' 3"  (160 cm) Weight: 88.3 kg (194 lb 11.2 oz) IBW/kg (Calculated) : 52.4 Heparin Dosing Weight: 73 kg  Vital Signs: Temp: 97.7 F (36.5 C) (07/12 0810) Temp Source: Oral (07/12 0810) BP: 113/79 (07/12 0810) Pulse Rate: 82 (07/12 0810)  Labs: Recent Labs    05/17/23 1954 05/17/23 2146 05/18/23 0236 05/18/23 0450 05/18/23 0800 05/18/23 1551 05/19/23 0248 05/19/23 0819  HGB 14.7  --  15.1*  --   --   --  14.8  --   HCT 43.1  --  44.1  --   --   --  43.2  --   PLT 273  --  282  --   --   --  272  --   HEPARINUNFRC  --   --   --   --    < > 0.10* 0.39 0.48  CREATININE 0.87  --  0.69  --   --   --  0.70  --   TROPONINIHS 573* 620*  --  585*  --   --   --   --    < > = values in this interval not displayed.    Estimated Creatinine Clearance: 88.7 mL/min (by C-G formula based on SCr of 0.7 mg/dL).   Medical History: Past Medical History:  Diagnosis Date   Asthma    Diabetes mellitus without complication (HCC)    Hyperlipemia     Medications:  Medications Prior to Admission  Medication Sig Dispense Refill Last Dose   acetaminophen (TYLENOL) 500 MG tablet Take 500-1,000 mg by mouth every 6 (six) hours as needed for moderate pain.   Past Month   albuterol (PROVENTIL HFA;VENTOLIN HFA) 108 (90 BASE) MCG/ACT inhaler Inhale 2 puffs into the lungs every 4 (four) hours as needed for wheezing or shortness of breath. 1  Inhaler 1 05/17/2023   ibuprofen (ADVIL,MOTRIN) 200 MG tablet Take 800-1,200 mg by mouth 2 (two) times daily as needed for moderate pain (pain).    05/17/2023   Scheduled:   aspirin EC  81 mg Oral Daily   atorvastatin  80 mg Oral Daily   diphenhydrAMINE  50 mg Oral Once   Or   diphenhydrAMINE  50 mg Intravenous Once   insulin aspart  0-20 Units Subcutaneous TID WC   insulin aspart  0-5 Units Subcutaneous QHS   insulin glargine-yfgn  20 Units Subcutaneous Daily   metoprolol tartrate  25 mg Oral BID   mometasone-formoterol  2 puff Inhalation BID   predniSONE  50 mg Oral Q6H   sodium chloride flush  3 mL Intravenous Q12H   Assessment: 52 yoF previously admitted for c/o left-sided CP radiating to LUE and jaw associated w/ SOB, nausea, lightheadedness, and dizziness and found to be in afib RVR. Pharmacy consulted to dose heparin for ACS/STEMI. Previously left AMA, but returned for continued care. Patient is not on anticoagulation PTA.  Heparin level 0.48 infusing @ 1700  u/hr . Level therapeutic. Hgb/plt wnl. No bleeding events or heparin infusion issues overnight per RN. Patient is scheduled for LHC today ~1330.   Goal of Therapy:  Heparin level 0.3-0.7 units/ml Monitor platelets by anticoagulation protocol: Yes   Plan:  Continue heparin 1700 units/hr Check daily heparin level Monitor daily CBC, s/sx bleeding Follow up [facility]  Arabella Merles, PharmD. Clinical Pharmacist 05/19/2023 10:30 AM

## 2023-05-19 NOTE — TOC Benefit Eligibility Note (Signed)
Pharmacy Patient Advocate Encounter  Insurance verification completed.    The patient is insured through [facility] Medicare Part D  Ran test claim for Brilinta 90 mg and the current 30 day co-pay is $0.00.   This test claim was processed through [facility]- copay amounts may vary at other pharmacies due to pharmacy/plan contracts, or as the patient moves through the different stages of their insurance plan.    Roland Earl, CPHT Pharmacy Patient Advocate Specialist [facility] Patient Advocate Team Direct Number: [phone]  Fax: [phone]

## 2023-05-19 NOTE — Progress Notes (Signed)
DAILY PROGRESS NOTE   Patient Name: Kathleen Marks Date of Encounter: 05/19/2023 Cardiologist: None  Chief Complaint   No chest pain  Patient Profile   52 yo female with recent NSTEMI or possible missed-STEMI, found to have LVEF 30-35% with LAD territory WMA, planned for cath, but left AMA 2 days ago, now returns with chest pain and facial swelling/cellulitis   Subjective   Needed contrast dye prophylaxis - plan for LHC today.   Objective   Vitals:   05/19/23 0153 05/19/23 0338 05/19/23 0349 05/19/23 0810  BP:  101/69 107/69 113/79  Pulse: 65 80 79 82  Resp: 14 15 13 18   Temp:  98.7 F (37.1 C) 97.7 F (36.5 C) 97.7 F (36.5 C)  TempSrc:  Oral Oral Oral  SpO2: 97% 95% 96% 95%  Weight:      Height:        Intake/Output Summary (Last 24 hours) at 05/19/2023 0837 Last data filed at 05/19/2023 0600 Gross per 24 hour  Intake 1167.95 ml  Output 300 ml  Net 867.95 ml   Filed Weights   05/17/23 1946 05/18/23 1517  Weight: 90.7 kg 88.3 kg    Physical Exam   General appearance: alert and no distress Lungs: clear to auscultation bilaterally Heart: regular rate and rhythm, S1, S2 normal, no murmur, click, rub or gallop Extremities: extremities normal, atraumatic, no cyanosis or edema Neurologic: Grossly normal  Inpatient Medications    Scheduled Meds:  aspirin EC  81 mg Oral Daily   atorvastatin  80 mg Oral Daily   diphenhydrAMINE  50 mg Oral Once   Or   diphenhydrAMINE  50 mg Intravenous Once   insulin aspart  0-20 Units Subcutaneous TID WC   insulin aspart  0-5 Units Subcutaneous QHS   insulin glargine-yfgn  20 Units Subcutaneous Daily   metoprolol tartrate  25 mg Oral BID   mometasone-formoterol  2 puff Inhalation BID   predniSONE  50 mg Oral Q6H   sodium chloride flush  3 mL Intravenous Q12H    Continuous Infusions:  sodium chloride     sodium chloride 10 mL/hr at 05/19/23 0600   sodium chloride 10 mL/hr at 05/19/23 0600   heparin 1,700  Units/hr (05/19/23 0715)   vancomycin Stopped (05/18/23 2241)    PRN Meds: sodium chloride, acetaminophen, albuterol, HYDROmorphone (DILAUDID) injection, nitroGLYCERIN, ondansetron (ZOFRAN) IV, oxyCODONE, sodium chloride flush   Labs   Results for orders placed or performed during the hospital encounter of 05/17/23 (from the past 48 hour(s))  Basic metabolic panel     Status: Abnormal   Collection Time: 05/17/23  7:54 PM  Result Value Ref Range   Sodium 134 (L) 135 - 145 mmol/L   Potassium 3.7 3.5 - 5.1 mmol/L   Chloride 97 (L) 98 - 111 mmol/L   CO2 21 (L) 22 - 32 mmol/L   Glucose, Bld 384 (H) 70 - 99 mg/dL    Comment: Glucose reference range applies only to samples taken after fasting for at least 8 hours.   BUN 16 6 - 20 mg/dL   Creatinine, Ser 1.30 0.44 - 1.00 mg/dL   Calcium 9.1 8.9 - 86.5 mg/dL   GFR, Estimated >78 >46 mL/min    Comment: (NOTE) Calculated using the CKD-EPI Creatinine Equation (2021)    Anion gap 16 (H) 5 - 15    Comment: Performed at [facility], [address] [place], [place] [address]  CBC     Status:  Abnormal   Collection Time: 05/17/23  7:54 PM  Result Value Ref Range   WBC 18.8 (H) 4.0 - 10.5 K/uL   RBC 5.14 (H) 3.87 - 5.11 MIL/uL   Hemoglobin 14.7 12.0 - 15.0 g/dL   HCT 91.4 78.2 - 95.6 %   MCV 83.9 80.0 - 100.0 fL   MCH 28.6 26.0 - 34.0 pg   MCHC 34.1 30.0 - 36.0 g/dL   RDW 21.3 08.6 - 57.8 %   Platelets 273 150 - 400 K/uL   nRBC 0.0 0.0 - 0.2 %    Comment: Performed at [facility], [address] [place], [place] [address]  Troponin I (High Sensitivity)     Status: Abnormal   Collection Time: 05/17/23  7:54 PM  Result Value Ref Range   Troponin I (High Sensitivity) 573 (HH) <18 ng/L    Comment: CRITICAL VALUE NOTED. VALUE IS CONSISTENT WITH PREVIOUSLY REPORTED/CALLED VALUE (NOTE) Elevated high sensitivity troponin I (hsTnI) values and significant  changes across serial measurements may suggest ACS but many other  chronic  and acute conditions are known to elevate hsTnI results.  Refer to the "Links" section for chest pain algorithms and additional  guidance. Performed at [facility], [address] [place], [place] [address]   hCG, serum, qualitative     Status: None   Collection Time: 05/17/23  7:54 PM  Result Value Ref Range   Preg, Serum NEGATIVE NEGATIVE    Comment:        THE SENSITIVITY OF THIS METHODOLOGY IS >10 mIU/mL. Performed at [facility], [address] [place], [place] [address]   Troponin I (High Sensitivity)     Status: Abnormal   Collection Time: 05/17/23  9:46 PM  Result Value Ref Range   Troponin I (High Sensitivity) 620 (HH) <18 ng/L    Comment: CRITICAL VALUE NOTED. VALUE IS CONSISTENT WITH PREVIOUSLY REPORTED/CALLED VALUE (NOTE) Elevated high sensitivity troponin I (hsTnI) values and significant  changes across serial measurements may suggest ACS but many other  chronic and acute conditions are known to elevate hsTnI results.  Refer to the "Links" section for chest pain algorithms and additional  guidance. Performed at [facility], [address], [place], [place] [address]   Basic metabolic panel     Status: Abnormal   Collection Time: 05/18/23  2:36 AM  Result Value Ref Range   Sodium 132 (L) 135 - 145 mmol/L   Potassium 3.5 3.5 - 5.1 mmol/L   Chloride 101 98 - 111 mmol/L   CO2 20 (L) 22 - 32 mmol/L   Glucose, Bld 300 (H) 70 - 99 mg/dL    Comment: Glucose reference range applies only to samples taken after fasting for at least 8 hours.   BUN 10 6 - 20 mg/dL   Creatinine, Ser 2.53 0.44 - 1.00 mg/dL   Calcium 8.6 (L) 8.9 - 10.3 mg/dL   GFR, Estimated >66 >44 mL/min    Comment: (NOTE) Calculated using the CKD-EPI Creatinine Equation (2021)    Anion gap 11 5 - 15    Comment: Performed at [facility], [address], [place], [place] [address]  Magnesium     Status: Abnormal   Collection Time: 05/18/23  2:36 AM  Result Value Ref Range    Magnesium 1.6 (L) 1.7 - 2.4 mg/dL    Comment: Performed at [facility], [address], [place], [place] [address]  CBC  Status: Abnormal   Collection Time: 05/18/23  2:36 AM  Result Value Ref Range   WBC 18.1 (H) 4.0 - 10.5 K/uL   RBC 5.32 (H) 3.87 - 5.11 MIL/uL   Hemoglobin 15.1 (H) 12.0 - 15.0 g/dL   HCT 60.4 54.0 - 98.1 %   MCV 82.9 80.0 - 100.0 fL   MCH 28.4 26.0 - 34.0 pg   MCHC 34.2 30.0 - 36.0 g/dL   RDW 19.1 47.8 - 29.5 %   Platelets 282 150 - 400 K/uL   nRBC 0.0 0.0 - 0.2 %    Comment: Performed at [facility] [place] [facility], [address] [place], [place] [address]  Troponin I (High Sensitivity)     Status: Abnormal   Collection Time: 05/18/23  4:50 AM  Result Value Ref Range   Troponin I (High Sensitivity) 585 (HH) <18 ng/L    Comment: CRITICAL VALUE NOTED. VALUE IS CONSISTENT WITH PREVIOUSLY REPORTED/CALLED VALUE (NOTE) Elevated high sensitivity troponin I (hsTnI) values and significant  changes across serial measurements may suggest ACS but many other  chronic and acute conditions are known to elevate hsTnI results.  Refer to the "Links" section for chest pain algorithms and additional  guidance. Performed at [facility], [address] [place], [place] [address]   CBG monitoring, ED     Status: Abnormal   Collection Time: 05/18/23  5:00 AM  Result Value Ref Range   Glucose-Capillary 238 (H) 70 - 99 mg/dL    Comment: Glucose reference range applies only to samples taken after fasting for at least 8 hours.  CBG monitoring, ED     Status: Abnormal   Collection Time: 05/18/23  7:59 AM  Result Value Ref Range   Glucose-Capillary 226 (H) 70 - 99 mg/dL    Comment: Glucose reference range applies only to samples taken after fasting for at least 8 hours.  Heparin level (unfractionated)     Status: Abnormal   Collection Time: 05/18/23  8:00 AM  Result Value Ref Range   Heparin Unfractionated 0.24 (L) 0.30 - 0.70 IU/mL    Comment: (NOTE) The clinical  reportable range upper limit is being lowered to >1.10 to align with the FDA approved guidance for the current laboratory assay.  If heparin results are below expected values, and patient dosage has  been confirmed, suggest follow up testing of antithrombin III levels. Performed at [facility], [address] [place], [place] [address]   Heparin level (unfractionated)     Status: Abnormal   Collection Time: 05/18/23  3:51 PM  Result Value Ref Range   Heparin Unfractionated 0.10 (L) 0.30 - 0.70 IU/mL    Comment: (NOTE) The clinical reportable range upper limit is being lowered to >1.10 to align with the FDA approved guidance for the current laboratory assay.  If heparin results are below expected values, and patient dosage has  been confirmed, suggest follow up testing of antithrombin III levels. Performed at [facility], [address], [place], [place] [address]   Glucose, capillary     Status: Abnormal   Collection Time: 05/18/23  5:09 PM  Result Value Ref Range   Glucose-Capillary 266 (H) 70 - 99 mg/dL    Comment: Glucose reference range applies only to samples taken after fasting for at least 8 hours.  Glucose, capillary     Status: Abnormal   Collection Time: 05/18/23  9:21 PM  Result Value Ref Range   Glucose-Capillary 403 (H) 70 -  99 mg/dL    Comment: Glucose reference range applies only to samples taken after fasting for at least 8 hours.  Basic metabolic panel     Status: Abnormal   Collection Time: 05/19/23  2:48 AM  Result Value Ref Range   Sodium 133 (L) 135 - 145 mmol/L   Potassium 4.5 3.5 - 5.1 mmol/L   Chloride 102 98 - 111 mmol/L   CO2 20 (L) 22 - 32 mmol/L   Glucose, Bld 358 (H) 70 - 99 mg/dL    Comment: Glucose reference range applies only to samples taken after fasting for at least 8 hours.   BUN 14 6 - 20 mg/dL   Creatinine, Ser 4.09 0.44 - 1.00 mg/dL   Calcium 8.8 (L) 8.9 - 10.3 mg/dL   GFR, Estimated >81 >19 mL/min    Comment:  (NOTE) Calculated using the CKD-EPI Creatinine Equation (2021)    Anion gap 11 5 - 15    Comment: Performed at [facility], [address] [place], [place] [address]  CBC     Status: Abnormal   Collection Time: 05/19/23  2:48 AM  Result Value Ref Range   WBC 19.4 (H) 4.0 - 10.5 K/uL   RBC 5.18 (H) 3.87 - 5.11 MIL/uL   Hemoglobin 14.8 12.0 - 15.0 g/dL   HCT 95.6 21.3 - 08.6 %   MCV 83.4 80.0 - 100.0 fL   MCH 28.6 26.0 - 34.0 pg   MCHC 34.3 30.0 - 36.0 g/dL   RDW 57.8 46.9 - 62.9 %   Platelets 272 150 - 400 K/uL   nRBC 0.0 0.0 - 0.2 %    Comment: Performed at [facility], [address], [place], [place] [address]  Heparin level (unfractionated)     Status: None   Collection Time: 05/19/23  2:48 AM  Result Value Ref Range   Heparin Unfractionated 0.39 0.30 - 0.70 IU/mL    Comment: (NOTE) The clinical reportable range upper limit is being lowered to >1.10 to align with the FDA approved guidance for the current laboratory assay.  If heparin results are below expected values, and patient dosage has  been confirmed, suggest follow up testing of antithrombin III levels. Performed at [facility], [address] [place], [place] [address]   Glucose, capillary     Status: Abnormal   Collection Time: 05/19/23  8:07 AM  Result Value Ref Range   Glucose-Capillary 316 (H) 70 - 99 mg/dL    Comment: Glucose reference range applies only to samples taken after fasting for at least 8 hours.    ECG   NSR at 83 - Personally Reviewed  Telemetry   Sinus rhythm - Personally Reviewed  Radiology    CT MAXILLOFACIAL W CONTRAST  Result Date: 05/19/2023 CLINICAL DATA:  52 year old female with face cellulitis. Possible developing abscess along the left jawline on ultrasound yesterday. EXAM: CT MAXILLOFACIAL WITH CONTRAST TECHNIQUE: Multidetector CT imaging of the maxillofacial structures was performed with intravenous contrast. Multiplanar CT image reconstructions were  also generated. RADIATION DOSE REDUCTION: This exam was performed according to the departmental dose-optimization program which includes automated exposure control, adjustment of the mA and/or kV according to patient size and/or use of iterative reconstruction technique. CONTRAST:  75mL OMNIPAQUE IOHEXOL 350 MG/ML SOLN status post 13 hour steroid premedication. No adverse events recorded. COMPARISON:  Head and neck ultrasound yesterday. FINDINGS: Osseous: Absent dentition. Mandible intact and normally located. Bilateral maxilla, zygoma, pterygoid, and nasal bones appear  intact. Intact central skull base. Visible cervical vertebrae appear intact and aligned, with bulky anterior C4-C5 endplate osteophytosis. Visible calvarium intact. Orbits: Intact orbital walls. Globes and intraorbital soft tissues appears symmetric and normal. Sinuses: Clear throughout. Soft tissues: Negative visible thyroid, larynx, pharynx, parapharyngeal spaces, retropharyngeal space. Sublingual space, right side submandibular, mastoid and parotid spaces are normal. On the left side there is superficial soft tissue swelling and stranding as well as nodular phlegmon lateral to the body of the left mandible. See series 3, image 65 and also coronal image 34. No soft tissue gas. No organized or drainable fluid collection there. Associated thickening of the left platysma and mild edema tracking into the left submandibular space. The submandibular gland appears to remain symmetric. Inflammation also tracks up the left masticator space to the level of the left parotid duct which is nondilated. The left parotid space appear spared. Major vascular structures in the bilateral neck and at the skull base appear to be enhancing and patent. Minimal cervical lymph node asymmetry on the left. No heterogeneous lymph nodes. Limited intracranial: Negative. IMPRESSION: 1. Left Facial Cellulitis with nodular phlegmon lateral to the body of the left mandible.  Secondary inflammation in the left submandibular and masticator spaces. But no abscess or drainable fluid. No other complicating features. 2. Absent dentition. Electronically Signed   By: Odessa Fleming M.D.   On: 05/19/2023 05:08   US SOFT TISSUE HEAD & NECK (NON-THYROID)  Result Date: 05/18/2023 CLINICAL DATA:  Facial cellulitis EXAM: ULTRASOUND OF HEAD/NECK SOFT TISSUES TECHNIQUE: Ultrasound examination of the head and neck soft tissues was performed in the area of clinical concern. COMPARISON:  None Available. FINDINGS: Along the anterior left jaw line, there is an ill-defined 2.2 x 1.3 x 2.3 cm area of somewhat heterogeneous echogenicity, which correlates with the palpable abnormality. No fluid collection or cyst is seen. No internal color flow is noted in this area. IMPRESSION: Ill-defined 2.2 x 1.3 x 2.3 cm area of somewhat heterogeneous echogenicity along the anterior left jaw line, which correlates with the palpable abnormality. This is nonspecific, but could represent a phlegmon or developing abscess. Correlate with physical exam and consider CT maxillofacial with contrast for further evaluation if clinically indicated. Electronically Signed   By: Wiliam Ke M.D.   On: 05/18/2023 03:43   DG Chest 2 View  Result Date: 05/18/2023 CLINICAL DATA:  Atrial fibrillation and chest pain. EXAM: CHEST - 2 VIEW COMPARISON:  Portable chest 05/15/2023 FINDINGS: The heart size and mediastinal contours are within normal limits. A stent is again noted in the circumflex coronary artery. Both lungs are clear. The visualized skeletal structures are intact, with thoracic spondylosis and slight thoracic dextroscoliosis. IMPRESSION: 1. No evidence of acute chest disease or changes. 2. Circumflex coronary artery stent. Electronically Signed   By: Almira Bar M.D.   On: 05/18/2023 00:53    Cardiac Studies   N/A  Assessment   Principal Problem:   Facial cellulitis Active Problems:   CAD S/P percutaneous coronary  angioplasty   Type 2 diabetes mellitus (HCC)   Non-ST elevation (NSTEMI) myocardial infarction (HCC)   Acute on chronic combined systolic and diastolic CHF (congestive heart failure) (HCC)   Plan   Pre-medicated for contrast dye allergy. Plan for LHC today. No further chest pain. Vitals stable - afebrile on antibiotics for facial cellulitis without abscess. Titrate HF meds for GDMT post-cath over the weekend.  Time Spent Directly with Patient:  I have spent a total of 25 minutes with  the patient reviewing hospital notes, telemetry, EKGs, labs and examining the patient as well as establishing an assessment and plan that was discussed personally with the patient.  > 50% of time was spent in direct patient care.  Length of Stay:  LOS: 1 day   [name], MD, [facility], FACP    [facility]  Medical Director of the [facility] Lipid [facility]  Cardiovascular Risk Reduction Clinic Diplomate of the American Board of Clinical Lipidology Attending Cardiologist  Direct Dial: [phone]  Fax: [phone]  Website:  [email or web address] 05/19/2023, 8:37 AM

## 2023-05-19 NOTE — Progress Notes (Signed)
Patient called Service response and requested to get 2 servings of her dinner order, educated patient on the importance of making sure that her sugar level is controlled but she is still adamant to get 2 servings since she didn't eat all day for her heart cath.

## 2023-05-19 NOTE — Progress Notes (Signed)
Subjective: Patient at procedure currently.  Objective: Vital signs in last 24 hours: Temp:  [97.7 F (36.5 C)-98.7 F (37.1 C)] 97.7 F (36.5 C) (07/12 0810) Pulse Rate:  [65-97] 82 (07/12 0810) Resp:  [13-20] 18 (07/12 0810) BP: (93-114)/(53-79) 113/79 (07/12 0810) SpO2:  [90 %-100 %] 95 % (07/12 0810) Weight:  [88.3 kg] 88.3 kg (07/11 1517) Wt Readings from Last 1 Encounters:  05/18/23 88.3 kg    Intake/Output from previous day: 07/11 0701 - 07/12 0700 In: 1168 [P.O.:300; I.V.:617.9; IV Piggyback:250.1] Out: 300 [Emesis/NG output:300] Intake/Output this shift: No intake/output data recorded.    Recent Labs    05/18/23 0236 05/19/23 0248  WBC 18.1* 19.4*  HGB 15.1* 14.8  HCT 44.1 43.2  PLT 282 272    Recent Labs    05/18/23 0236 05/19/23 0248  NA 132* 133*  K 3.5 4.5  CL 101 102  CO2 20* 20*  GLUCOSE 300* 358*  BUN 10 14  CREATININE 0.69 0.70  CALCIUM 8.6* 8.8*    Medications: I have reviewed the patient's current medications.  Assessment/Plan: Left cheek cellulitis  I personally reviewed her CT scan.  With no drainable abscess and reported continued improvement, no need for surgical intervention.  Continue antibiotic coverage to include MRSA coverage.  Will sign off.   LOS: 1 day   Christia Reading 05/19/2023, 1:30 PM

## 2023-05-20 DIAGNOSIS — L03211 Cellulitis of face: Secondary | ICD-10-CM | POA: Diagnosis not present

## 2023-05-20 LAB — BASIC METABOLIC PANEL
Anion gap: 12 (ref 5–15)
BUN: 18 mg/dL (ref 6–20)
CO2: 20 mmol/L — ABNORMAL LOW (ref 22–32)
Calcium: 8.4 mg/dL — ABNORMAL LOW (ref 8.9–10.3)
Chloride: 101 mmol/L (ref 98–111)
Creatinine, Ser: 0.82 mg/dL (ref 0.44–1.00)
GFR, Estimated: >60 mL/min (ref >60)
Glucose, Bld: 317 mg/dL — ABNORMAL HIGH (ref 70–99)
Potassium: 3.6 mmol/L (ref 3.5–5.1)
Sodium: 133 mmol/L — ABNORMAL LOW (ref 135–145)

## 2023-05-20 LAB — GLUCOSE, CAPILLARY
Glucose-Capillary: 188 mg/dL — ABNORMAL HIGH (ref 70–99)
Glucose-Capillary: 262 mg/dL — ABNORMAL HIGH (ref 70–99)
Glucose-Capillary: 309 mg/dL — ABNORMAL HIGH (ref 70–99)
Glucose-Capillary: 335 mg/dL — ABNORMAL HIGH (ref 70–99)
Glucose-Capillary: 357 mg/dL — ABNORMAL HIGH (ref 70–99)
Glucose-Capillary: 407 mg/dL — ABNORMAL HIGH (ref 70–99)

## 2023-05-20 LAB — CBC
HCT: 39.9 % (ref 36.0–46.0)
Hemoglobin: 13.7 g/dL (ref 12.0–15.0)
MCH: 29.5 pg (ref 26.0–34.0)
MCHC: 34.3 g/dL (ref 30.0–36.0)
MCV: 85.8 fL (ref 80.0–100.0)
Platelets: 291 10*3/uL (ref 150–400)
RBC: 4.65 MIL/uL (ref 3.87–5.11)
RDW: 13.8 % (ref 11.5–15.5)
WBC: 21.6 10*3/uL — ABNORMAL HIGH (ref 4.0–10.5)
nRBC: 0 % (ref 0.0–0.2)

## 2023-05-20 MED ORDER — KETOROLAC TROMETHAMINE 15 MG/ML IJ SOLN
15.0000 mg | Freq: Four times a day (QID) | INTRAMUSCULAR | Status: DC | PRN
  Administered 2023-05-20 – 2023-05-21 (×3): 15 mg via INTRAVENOUS
  Filled 2023-05-20 (×3): qty 1

## 2023-05-20 MED ORDER — INSULIN ASPART 100 UNIT/ML IJ SOLN
8.0000 [IU] | Freq: Once | INTRAMUSCULAR | Status: AC
  Administered 2023-05-20: 8 [IU] via SUBCUTANEOUS

## 2023-05-20 NOTE — Progress Notes (Signed)
   Rounding Note    Patient Name: Kathleen Marks Date of Encounter: 05/20/2023  [facility] Cardiologist: None   Subjective   NAEO.  Chest pain resolved. She feels much better with "my heart". Her cheek is her biggest concern. Now draining purulent material.  Vital Signs    Vitals:   05/19/23 2312 05/20/23 0338 05/20/23 0739 05/20/23 0845  BP: 122/72 111/72 116/82 (!) 103/58  Pulse: 93 86 79 80  Resp: 20 16 18    Temp: 98.7 F (37.1 C) 98.1 F (36.7 C) 98.1 F (36.7 C)   TempSrc: Oral Oral Oral   SpO2: 98% 95% 100%   Weight:      Height:        Intake/Output Summary (Last 24 hours) at 05/20/2023 0912 Last data filed at 05/20/2023 0300 Gross per 24 hour  Intake 481.18 ml  Output --  Net 481.18 ml      05/18/2023    3:17 PM 05/17/2023    7:46 PM 05/16/2023   12:23 AM  Last 3 Weights  Weight (lbs) 194 lb 11.2 oz 200 lb 197 lb 5 oz  Weight (kg) 88.315 kg 90.719 kg 89.5 kg      Telemetry    Personally Reviewed  ECG     Personally Reviewed  Physical Exam    GEN: No acute distress.  Laying flat. L cheek swollen. Cardiac: RRR, no murmurs, rubs, or gallops.  Respiratory: Clear to auscultation bilaterally. Psych: Normal affect   Assessment & Plan    #NSTEMI #CAD Doing well after PCI to the LAD and POBA to the D2 yesterday. Chest pain resolved.  EF 30-35%, will need repeat in outpatient setting Cont aspirin, ticagrelor, atorvastatin  #HFrEF In setting of severe CAD. E 30-35% On metoprolol tartrate Marginal BP preventing initiation of entresto at this time. If BP's improve, consider adding entresto low dose prior to discharge. Can also be added at follow up appt. EF will need to be reassessed after 90 days. If still reduced, consider ICD for primary prevention  #DM Poorly controlled. HgA1c 11.2. Management per IM team.    Sheria Lang T. Lalla Brothers, MD, [facility], [facility] Cardiac Electrophysiology

## 2023-05-20 NOTE — Progress Notes (Signed)
Subjective: Facial cellulitis, reconsulted for consideration for incision and drainage.  Objective: Vital signs in last 24 hours: Temp:  [98.1 F (36.7 C)-98.8 F (37.1 C)] 98.1 F (36.7 C) (07/13 0739) Pulse Rate:  [0-112] 80 (07/13 0845) Resp:  [10-20] 18 (07/13 0739) BP: (96-142)/(58-89) 103/58 (07/13 0845) SpO2:  [92 %-100 %] 97 % (07/13 0845) Weight change:  Last BM Date : 05/18/23  Intake/Output from previous day: 07/12 0701 - 07/13 0700 In: 481.2 [I.V.:231.2; IV Piggyback:250] Out: -  Intake/Output this shift: No intake/output data recorded.  PHYSICAL EXAM: Awake and alert.  She seems comfortable.  There is erythema and firm swelling on the left lower cheek area.  There is no fluctuance.  There is an area centrally of skin breakdown where there had been drainage earlier.  There is no trismus.  Lab Results: Recent Labs    05/19/23 1808 05/20/23 0737  WBC 21.3* 21.6*  HGB 15.9* 13.7  HCT 45.3 39.9  PLT 315 291   BMET Recent Labs    05/19/23 0248 05/19/23 1808 05/20/23 0737  NA 133*  --  133*  K 4.5  --  3.6  CL 102  --  101  CO2 20*  --  20*  GLUCOSE 358*  --  317*  BUN 14  --  18  CREATININE 0.70 0.81 0.82  CALCIUM 8.8*  --  8.4*    Studies/Results: CARDIAC CATHETERIZATION  Result Date: 05/19/2023   2nd Diag lesion is 99% stenosed.   Mid LAD-1 lesion is 90% stenosed.   Mid LAD-2 lesion is 40% stenosed.   Dist LAD lesion is 40% stenosed.   2nd Mrg lesion is 20% stenosed.   Lat 2nd Mrg lesion is 90% stenosed.   Prox RCA lesion is 100% stenosed.   A drug-eluting stent was successfully placed using a SYNERGY [id number] [id number].   Balloon angioplasty was performed using a BALLN EMERGE MR [id number].   Post intervention, there is a 0% residual stenosis.   Post intervention, there is a 50% residual stenosis.   LV end diastolic pressure is normal.   Recommend uninterrupted dual antiplatelet therapy with Aspirin 81mg  daily and Ticagrelor 90mg  twice daily for a minimum of 12  months (ACS-Class I recommendation). 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 Subtotal second diagonal. Stents in OM2 are still patent. 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 Aggressive risk factor modification. Guideline directed medical therapy for LV dysfunction.   CT MAXILLOFACIAL W CONTRAST  Result Date: 05/19/2023 CLINICAL DATA:  52 year old female with face cellulitis. Possible developing abscess along the left jawline on ultrasound yesterday. EXAM: CT MAXILLOFACIAL WITH CONTRAST TECHNIQUE: Multidetector CT imaging of the maxillofacial structures was performed with intravenous contrast. Multiplanar CT image reconstructions were also generated. RADIATION DOSE REDUCTION: This exam was performed according to the departmental dose-optimization program which includes automated exposure control, adjustment of the mA and/or kV according to patient size and/or use of iterative reconstruction technique. CONTRAST:  75mL OMNIPAQUE IOHEXOL 350 MG/ML SOLN status post 13 hour steroid premedication. No adverse events recorded. COMPARISON:  Head and neck ultrasound yesterday. FINDINGS: Osseous: Absent dentition. Mandible intact and normally located. Bilateral maxilla, zygoma, pterygoid, and nasal bones appear intact. Intact central skull base. Visible cervical vertebrae appear intact and aligned, with bulky anterior C4-C5 endplate osteophytosis. Visible  calvarium intact. Orbits: Intact orbital walls. Globes and intraorbital soft tissues appears symmetric and normal. Sinuses: Clear throughout. Soft tissues: Negative visible thyroid, larynx, pharynx, parapharyngeal spaces, retropharyngeal space. Sublingual space, right side submandibular, mastoid and parotid spaces are normal. On the left side there is superficial  soft tissue swelling and stranding as well as nodular phlegmon lateral to the body of the left mandible. See series 3, image 65 and also coronal image 34. No soft tissue gas. No organized or drainable fluid collection there. Associated thickening of the left platysma and mild edema tracking into the left submandibular space. The submandibular gland appears to remain symmetric. Inflammation also tracks up the left masticator space to the level of the left parotid duct which is nondilated. The left parotid space appear spared. Major vascular structures in the bilateral neck and at the skull base appear to be enhancing and patent. Minimal cervical lymph node asymmetry on the left. No heterogeneous lymph nodes. Limited intracranial: Negative. IMPRESSION: 1. Left Facial Cellulitis with nodular phlegmon lateral to the body of the left mandible. Secondary inflammation in the left submandibular and masticator spaces. But no abscess or drainable fluid. No other complicating features. 2. Absent dentition. Electronically Signed   By: Odessa Fleming M.D.   On: 05/19/2023 05:08    Medications: I have reviewed the patient's current medications.  Assessment/Plan: Continued facial cellulitis.  No organized or drainable abscess.  Surgery not indicated.  Recommend start warm compresses and continue IV antibiotics.  Continue aggressive diabetes management.  Reconsult if things get much worse.  LOS: 2 days    L03.211   Medical Decision Making: #/Complex Problems: 3  Data Reviewed:3  Management:3 (1-Straightforward, 2-Low, 3-Moderate, 4-High)   Serena Colonel 05/20/2023, 11:04 AM

## 2023-05-20 NOTE — Progress Notes (Addendum)
PROGRESS NOTE  Kathleen Marks  ZOX:[id number] DOB: 19-Jan-1971 DOA: 05/17/2023 PCP: Glenna Durand, PA-C   Brief Narrative: Patient is a 52 year old female with history of poorly controlled diabetes, asthma, hyperlipidemia, coronary artery disease with PCI and stent placement, recent admission for suspected acute MI but left AMA before left heart cath presented back with complaint of left lower facial swelling, pain.  She was admitted here 3 days ago for chest pain, elevated troponin, echo showed EF of 35%, severe hypokinesis.  She was bradycardic but she left AMA to take care of her children. On presentation she was afebrile but blood pressure was elevated, EKG showed sinus tachycardia.  Chest x-ray did not show pneumonia.  Troponins were elevated, WC count of 18,000.  Patient was started on Rocephin for facial cellulitis and also started on IV heparin for NSTEMI.  Cardiology consulted.  Cardiac cath showed multivessel disease with 90% stenosis of mid LAD, 99% stenosis of second diagonal.  Status post PCI of mid LAD, balloon angioplasty.  Started on aspirin, Brilinta.  She is requesting to see ENT surgeon again for persistent pain on the left jaw, requesting I&D.Dr Pollyann Kennedy will see her    Assessment & Plan:  Principal Problem:   Facial cellulitis Active Problems:   CAD S/P percutaneous coronary angioplasty   Type 2 diabetes mellitus (HCC)   Non-ST elevation (NSTEMI) myocardial infarction [facility])   Acute on chronic combined systolic and diastolic CHF (congestive heart failure) (HCC)   NSTEMI: Scenario as above.  Elevated troponin.  Started on  IV heparin,now stopped.  Cardiac cath showed multivessel disease with 90% stenosis of mid LAD, 99% stenosis of second diagonal.  Status post PCI of mid LAD, balloon angioplasty.  Started on aspirin, Brilinta. Plan for 12 months.   Denies any chest pain today. Very noncompliant patient.   Facial cellulitis/abscess: Currently afebrile. Ultrasound of  the face showed 2.2 x 1.3 x 2.3 cm on anterior left jaw.  ENT consulted,discussed with Dr Jenne Pane.  Dr. Ellery Plunk recommended CT maxillofacial with contrast which did not show any drainable abscess but showed nodular phlegmon.  Continue vancomycin for now.She is requesting to see ENT surgeon again for persistent pain on the left jaw, requesting I&D.Dr Pollyann Kennedy will see her.  Leukocytosis persist.     Type 2 diabetes: Uncontrolled.  Recent  A1c of 11.2.  Continue current insulin regimen.  Monitor blood sugars.  Consulted  diabetic coordinator.  Patient denies that her diabetes is false.  She states she does not have any diabetes despite her hemoglobin A1c of more than 10.  She states the medications that the doctors provided developed the diabetes.  I have requested diabetic coordinator to follow-up on her today.   Hyperlipidemia: On statin   Hypomagnesemia: Supplemented and corrected   Hypertension: Continue current medication.  On metoprolol   Asthma: Currently not in exacerbation.  Continue bronchodilators   Obesity: BMI of 34.4       DVT prophylaxis:enoxaparin (LOVENOX) injection 40 mg Start: 05/20/23 0800IV heparin     Code Status: Full Code  Family Communication: None at bedside  Patient status:Inpatient  Patient is from :Home  Anticipated discharge WJ:XBJY  Estimated DC date:tomorrow , waiting for ENT evaluation   Consultants: Cardiology,ENT  Procedures:None  Antimicrobials:  Anti-infectives (From admission, onward)    Start     Dose/Rate Route Frequency Ordered Stop   05/18/23 2200  Vancomycin (VANCOCIN) 1,250 mg in sodium chloride 0.9 % 250 mL IVPB  Status:  Discontinued  1,250 mg 166.7 mL/hr over 90 Minutes Intravenous Every 24 hours 05/18/23 0311 05/18/23 0311   05/18/23 2200  vancomycin (VANCOREADY) IVPB 1250 mg/250 mL        1,250 mg 166.7 mL/hr over 90 Minutes Intravenous Every 24 hours 05/18/23 0311     05/18/23 0230  vancomycin (VANCOREADY) IVPB 1500 mg/300  mL        1,500 mg 150 mL/hr over 120 Minutes Intravenous Once 05/18/23 0221 05/18/23 0515   05/18/23 0215  vancomycin (VANCOCIN) IVPB 1000 mg/200 mL premix  Status:  Discontinued        1,000 mg 200 mL/hr over 60 Minutes Intravenous  Once 05/18/23 0214 05/18/23 0221   05/18/23 0100  cefTRIAXone (ROCEPHIN) 1 g in sodium chloride 0.9 % 100 mL IVPB        1 g 200 mL/hr over 30 Minutes Intravenous  Once 05/18/23 0057 05/18/23 0200       Subjective: Patient seen and examined at bedside today.  She complains of severe pain on the left cellulitic area of the face.  She states she was to see ENT surgeon and states that there is pus inside which needs  to be taken out.  Objective: Vitals:   05/19/23 2312 05/20/23 0338 05/20/23 0739 05/20/23 0845  BP: 122/72 111/72 116/82 (!) 103/58  Pulse: 93 86 79 80  Resp: 20 16 18    Temp: 98.7 F (37.1 C) 98.1 F (36.7 C) 98.1 F (36.7 C)   TempSrc: Oral Oral Oral   SpO2: 98% 95% 100% 97%  Weight:      Height:        Intake/Output Summary (Last 24 hours) at 05/20/2023 1057 Last data filed at 05/20/2023 0300 Gross per 24 hour  Intake 481.18 ml  Output --  Net 481.18 ml   Filed Weights   05/17/23 1946 05/18/23 1517  Weight: 90.7 kg 88.3 kg    Examination:  General exam: Overall comfortable, not in distress,obese HEENT: PERRL, nodular cellulitic area  on the left jaw Respiratory system:  no wheezes or crackles  Cardiovascular system: S1 & S2 heard, RRR.  Gastrointestinal system: Abdomen is nondistended, soft and nontender. Central nervous system: Alert and oriented Extremities: No edema, no clubbing ,no cyanosis Skin: No rashes, no ulcers,no icterus     Data Reviewed: I have personally reviewed following labs and imaging studies  CBC: Recent Labs  Lab 05/17/23 1954 05/18/23 0236 05/19/23 0248 05/19/23 1808 05/20/23 0737  WBC 18.8* 18.1* 19.4* 21.3* 21.6*  HGB 14.7 15.1* 14.8 15.9* 13.7  HCT 43.1 44.1 43.2 45.3 39.9  MCV 83.9  82.9 83.4 85.3 85.8  PLT 273 282 272 315 291   Basic Metabolic Panel: Recent Labs  Lab 05/16/23 0622 05/17/23 1954 05/18/23 0236 05/19/23 0248 05/19/23 1808 05/20/23 0737  NA 132* 134* 132* 133*  --  133*  K 4.7 3.7 3.5 4.5  --  3.6  CL 101 97* 101 102  --  101  CO2 20* 21* 20* 20*  --  20*  GLUCOSE 485* 384* 300* 358*  --  317*  BUN 11 16 10 14   --  18  CREATININE 0.78 0.87 0.69 0.70 0.81 0.82  CALCIUM 8.6* 9.1 8.6* 8.8*  --  8.4*  MG  --   --  1.6*  --   --   --      Recent Results (from the past 240 hour(s))  MRSA Next Gen by PCR, Nasal     Status: Abnormal  Collection Time: 05/16/23 12:28 AM   Specimen: Nasal Mucosa; Nasal Swab  Result Value Ref Range Status   MRSA by PCR Next Gen DETECTED (A) NOT DETECTED Final    Comment: RESULT CALLED TO, READ BACK BY AND VERIFIED WITH: A COOPER,RN@[id number]  05/16/23 MK (NOTE) The GeneXpert MRSA Assay (FDA approved for NASAL specimens only), is one component of a comprehensive MRSA colonization surveillance program. It is not intended to diagnose MRSA infection nor to guide or monitor treatment for MRSA infections. Test performance is not FDA approved in patients less than 53 years old. Performed at [facility], [address] [place], [place] [address]   Culture, blood (Routine X 2) w Reflex to ID Panel     Status: None (Preliminary result)   Collection Time: 05/18/23 12:48 PM   Specimen: BLOOD LEFT HAND  Result Value Ref Range Status   Specimen Description BLOOD LEFT HAND  Final   Special Requests   Final    BOTTLES DRAWN AEROBIC AND ANAEROBIC Blood Culture adequate volume   Culture   Final    NO GROWTH 2 DAYS Performed at [facility], [address] [place], [place] [address]    Report Status PENDING  Incomplete  Culture, blood (Routine X 2) w Reflex to ID Panel     Status: None (Preliminary result)   Collection Time: 05/18/23 12:48 PM   Specimen: BLOOD RIGHT HAND  Result Value Ref Range Status   Specimen  Description BLOOD RIGHT HAND  Final   Special Requests   Final    BOTTLES DRAWN AEROBIC AND ANAEROBIC Blood Culture results may not be optimal due to an excessive volume of blood received in culture bottles   Culture   Final    NO GROWTH 2 DAYS Performed at [facility], [address] [place], [place] [address]    Report Status PENDING  Incomplete  Aerobic Culture w Gram Stain (superficial specimen)     Status: None (Preliminary result)   Collection Time: 05/20/23  2:00 AM   Specimen: Wound  Result Value Ref Range Status   Specimen Description WOUND  Final   Special Requests FACE  Final   Gram Stain   Final    NO WBC SEEN NO ORGANISMS SEEN Performed at [facility], [address], [place], [place] [address]    Culture PENDING  Incomplete   Report Status PENDING  Incomplete     Radiology Studies: CARDIAC CATHETERIZATION  Result Date: 05/19/2023   2nd Diag lesion is 99% stenosed.   Mid LAD-1 lesion is 90% stenosed.   Mid LAD-2 lesion is 40% stenosed.   Dist LAD lesion is 40% stenosed.   2nd Mrg lesion is 20% stenosed.   Lat 2nd Mrg lesion is 90% stenosed.   Prox RCA lesion is 100% stenosed.   A drug-eluting stent was successfully placed using a SYNERGY [id number] [id number].   Balloon angioplasty was performed using a BALLN EMERGE MR [id number].   Post intervention, there is a 0% residual stenosis.   Post intervention, there is a 50% residual stenosis.   LV end diastolic pressure is normal.   Recommend uninterrupted dual antiplatelet therapy with Aspirin 81mg  daily and Ticagrelor 90mg  twice daily for a minimum of 12 months (ACS-Class I recommendation). 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 Subtotal second diagonal. Stents in OM2 are still patent. Chronic occlusion of a small nondominant RCA Normal LVEDP Successful PCI of the mid LAD  with 2.75 x 16 mm Synergy stent post dilated to 3.0 Successful balloon angioplasty of the second diagonal Plan: DAPT for one  year. Aggressive risk factor modification. Guideline directed medical therapy for LV dysfunction.   CT MAXILLOFACIAL W CONTRAST  Result Date: 05/19/2023 CLINICAL DATA:  52 year old female with face cellulitis. Possible developing abscess along the left jawline on ultrasound yesterday. EXAM: CT MAXILLOFACIAL WITH CONTRAST TECHNIQUE: Multidetector CT imaging of the maxillofacial structures was performed with intravenous contrast. Multiplanar CT image reconstructions were also generated. RADIATION DOSE REDUCTION: This exam was performed according to the departmental dose-optimization program which includes automated exposure control, adjustment of the mA and/or kV according to patient size and/or use of iterative reconstruction technique. CONTRAST:  75mL OMNIPAQUE IOHEXOL 350 MG/ML SOLN status post 13 hour steroid premedication. No adverse events recorded. COMPARISON:  Head and neck ultrasound yesterday. FINDINGS: Osseous: Absent dentition. Mandible intact and normally located. Bilateral maxilla, zygoma, pterygoid, and nasal bones appear intact. Intact central skull base. Visible cervical vertebrae appear intact and aligned, with bulky anterior C4-C5 endplate osteophytosis. Visible calvarium intact. Orbits: Intact orbital walls. Globes and intraorbital soft tissues appears symmetric and normal. Sinuses: Clear throughout. Soft tissues: Negative visible thyroid, larynx, pharynx, parapharyngeal spaces, retropharyngeal space. Sublingual space, right side submandibular, mastoid and parotid spaces are normal. On the left side there is superficial soft tissue swelling and stranding as well as nodular phlegmon lateral to the body of the left mandible. See series 3, image 65 and also coronal image 34. No soft tissue gas. No organized or drainable fluid collection there. Associated thickening of the left platysma and mild edema tracking into the left submandibular space. The submandibular gland appears to remain symmetric.  Inflammation also tracks up the left masticator space to the level of the left parotid duct which is nondilated. The left parotid space appear spared. Major vascular structures in the bilateral neck and at the skull base appear to be enhancing and patent. Minimal cervical lymph node asymmetry on the left. No heterogeneous lymph nodes. Limited intracranial: Negative. IMPRESSION: 1. Left Facial Cellulitis with nodular phlegmon lateral to the body of the left mandible. Secondary inflammation in the left submandibular and masticator spaces. But no abscess or drainable fluid. No other complicating features. 2. Absent dentition. Electronically Signed   By: Odessa Fleming M.D.   On: 05/19/2023 05:08    Scheduled Meds:  aspirin EC  81 mg Oral Daily   atorvastatin  80 mg Oral Daily   enoxaparin (LOVENOX) injection  40 mg Subcutaneous Q24H   insulin aspart  0-20 Units Subcutaneous TID WC   insulin aspart  0-5 Units Subcutaneous QHS   insulin glargine-yfgn  30 Units Subcutaneous Daily   metoprolol tartrate  25 mg Oral BID   mometasone-formoterol  2 puff Inhalation BID   sodium chloride flush  3 mL Intravenous Q12H   sodium chloride flush  3 mL Intravenous Q12H   ticagrelor  90 mg Oral BID   Continuous Infusions:  sodium chloride     vancomycin 1,250 mg (05/19/23 2136)     LOS: 2 days   Burnadette Pop, MD [facility] P7/13/2024, 10:57 AM

## 2023-05-20 NOTE — Progress Notes (Addendum)
Pt declined ambulation. Sts she is nauseous at this time. She states she does get up and ambulate around the room by herself.    Discussed with pt MI, stent, importance of Plavix, restrictions, diet, exercise as tolerated, NTG, smoking cessation and CRPII. Pt is interested in CRPII, but transportation is an issue. Will place referral for [place] CRPII. Will place referral for [facility] CRPII.  [phone]  Guss Bunde, RRT 05/20/2023 0825 AM

## 2023-05-20 NOTE — Plan of Care (Signed)
  Problem: Education: Goal: Ability to describe self-care measures that may prevent or decrease complications (Diabetes Survival Skills Education) will improve Outcome: Progressing Goal: Individualized Educational Video(s) Outcome: Progressing   Problem: Coping: Goal: Ability to adjust to condition or change in health will improve Outcome: Progressing   Problem: Fluid Volume: Goal: Ability to maintain a balanced intake and output will improve Outcome: Progressing   Problem: Health Behavior/Discharge Planning: Goal: Ability to identify and utilize available resources and services will improve Outcome: Progressing Goal: Ability to manage health-related needs will improve Outcome: Progressing   Problem: Metabolic: Goal: Ability to maintain appropriate glucose levels will improve Outcome: Progressing   Problem: Nutritional: Goal: Maintenance of adequate nutrition will improve Outcome: Progressing Goal: Progress toward achieving an optimal weight will improve Outcome: Progressing   Problem: Skin Integrity: Goal: Risk for impaired skin integrity will decrease Outcome: Progressing   Problem: Tissue Perfusion: Goal: Adequacy of tissue perfusion will improve Outcome: Progressing   Problem: Education: Goal: Understanding of cardiac disease, CV risk reduction, and recovery process will improve Outcome: Progressing Goal: Individualized Educational Video(s) Outcome: Progressing   Problem: Activity: Goal: Ability to tolerate increased activity will improve Outcome: Progressing   Problem: Cardiac: Goal: Ability to achieve and maintain adequate cardiovascular perfusion will improve Outcome: Progressing   Problem: Health Behavior/Discharge Planning: Goal: Ability to safely manage health-related needs after discharge will improve Outcome: Progressing   Problem: Education: Goal: Understanding of CV disease, CV risk reduction, and recovery process will improve Outcome:  Progressing Goal: Individualized Educational Video(s) Outcome: Progressing   Problem: Activity: Goal: Ability to return to baseline activity level will improve Outcome: Progressing   Problem: Cardiovascular: Goal: Ability to achieve and maintain adequate cardiovascular perfusion will improve Outcome: Progressing Goal: Vascular access site(s) Level 0-1 will be maintained Outcome: Progressing   Problem: Health Behavior/Discharge Planning: Goal: Ability to safely manage health-related needs after discharge will improve Outcome: Progressing   Problem: Education: Goal: Knowledge of General Education information will improve Description: Including pain rating scale, medication(s)/side effects and non-pharmacologic comfort measures Outcome: Progressing   Problem: Health Behavior/Discharge Planning: Goal: Ability to manage health-related needs will improve Outcome: Progressing   Problem: Clinical Measurements: Goal: Ability to maintain clinical measurements within normal limits will improve Outcome: Progressing Goal: Will remain free from infection Outcome: Progressing Goal: Diagnostic test results will improve Outcome: Progressing Goal: Respiratory complications will improve Outcome: Progressing Goal: Cardiovascular complication will be avoided Outcome: Progressing   Problem: Activity: Goal: Risk for activity intolerance will decrease Outcome: Progressing   Problem: Nutrition: Goal: Adequate nutrition will be maintained Outcome: Progressing   Problem: Coping: Goal: Level of anxiety will decrease Outcome: Progressing   Problem: Elimination: Goal: Will not experience complications related to bowel motility Outcome: Progressing Goal: Will not experience complications related to urinary retention Outcome: Progressing   Problem: Pain Managment: Goal: General experience of comfort will improve Outcome: Progressing   Problem: Safety: Goal: Ability to remain free from  injury will improve Outcome: Progressing   Problem: Skin Integrity: Goal: Risk for impaired skin integrity will decrease Outcome: Progressing   

## 2023-05-21 ENCOUNTER — Encounter (HOSPITAL_COMMUNITY): Payer: Self-pay | Admitting: *Deleted

## 2023-05-21 ENCOUNTER — Other Ambulatory Visit: Payer: Self-pay

## 2023-05-21 ENCOUNTER — Observation Stay (HOSPITAL_COMMUNITY)
Admission: EM | Admit: 2023-05-21 | Discharge: 2023-05-22 | Disposition: A | Discharge status: Home / Self Care | Payer: Medicare HMO | Attending: Internal Medicine | Admitting: Internal Medicine

## 2023-05-21 ENCOUNTER — Telehealth: Payer: Self-pay | Admitting: Cardiology

## 2023-05-21 ENCOUNTER — Emergency Department (HOSPITAL_COMMUNITY): Payer: Medicare HMO

## 2023-05-21 DIAGNOSIS — J45998 Other asthma: Secondary | ICD-10-CM | POA: Insufficient documentation

## 2023-05-21 DIAGNOSIS — I5022 Chronic systolic (congestive) heart failure: Secondary | ICD-10-CM | POA: Diagnosis present

## 2023-05-21 DIAGNOSIS — E119 Type 2 diabetes mellitus without complications: Secondary | ICD-10-CM

## 2023-05-21 DIAGNOSIS — J45909 Unspecified asthma, uncomplicated: Secondary | ICD-10-CM | POA: Diagnosis present

## 2023-05-21 DIAGNOSIS — Z79899 Other long term (current) drug therapy: Secondary | ICD-10-CM | POA: Insufficient documentation

## 2023-05-21 DIAGNOSIS — F17210 Nicotine dependence, cigarettes, uncomplicated: Secondary | ICD-10-CM | POA: Diagnosis not present

## 2023-05-21 DIAGNOSIS — L03211 Cellulitis of face: Principal | ICD-10-CM | POA: Insufficient documentation

## 2023-05-21 DIAGNOSIS — Z7982 Long term (current) use of aspirin: Secondary | ICD-10-CM | POA: Insufficient documentation

## 2023-05-21 DIAGNOSIS — Z7984 Long term (current) use of oral hypoglycemic drugs: Secondary | ICD-10-CM | POA: Diagnosis not present

## 2023-05-21 DIAGNOSIS — Z955 Presence of coronary angioplasty implant and graft: Secondary | ICD-10-CM | POA: Insufficient documentation

## 2023-05-21 DIAGNOSIS — E1169 Type 2 diabetes mellitus with other specified complication: Secondary | ICD-10-CM

## 2023-05-21 DIAGNOSIS — Z9861 Coronary angioplasty status: Secondary | ICD-10-CM

## 2023-05-21 DIAGNOSIS — R220 Localized swelling, mass and lump, head: Secondary | ICD-10-CM | POA: Diagnosis present

## 2023-05-21 DIAGNOSIS — I2510 Atherosclerotic heart disease of native coronary artery without angina pectoris: Secondary | ICD-10-CM

## 2023-05-21 LAB — CBC WITH DIFFERENTIAL/PLATELET
Abs Immature Granulocytes: 0.15 10*3/uL — ABNORMAL HIGH (ref 0.00–0.07)
Basophils Absolute: 0 10*3/uL (ref 0.0–0.1)
Basophils Relative: 0 %
Eosinophils Absolute: 0.1 10*3/uL (ref 0.0–0.5)
Eosinophils Relative: 1 %
HCT: 41.7 % (ref 36.0–46.0)
Hemoglobin: 14.3 g/dL (ref 12.0–15.0)
Immature Granulocytes: 1 %
Lymphocytes Relative: 19 %
Lymphs Abs: 2.7 10*3/uL (ref 0.7–4.0)
MCH: 28.9 pg (ref 26.0–34.0)
MCHC: 34.3 g/dL (ref 30.0–36.0)
MCV: 84.2 fL (ref 80.0–100.0)
Monocytes Absolute: 1.1 10*3/uL — ABNORMAL HIGH (ref 0.1–1.0)
Monocytes Relative: 8 %
Neutro Abs: 9.8 10*3/uL — ABNORMAL HIGH (ref 1.7–7.7)
Neutrophils Relative %: 71 %
Platelets: 315 10*3/uL (ref 150–400)
RBC: 4.95 MIL/uL (ref 3.87–5.11)
RDW: 13.5 % (ref 11.5–15.5)
WBC: 14 10*3/uL — ABNORMAL HIGH (ref 4.0–10.5)
nRBC: 0 % (ref 0.0–0.2)

## 2023-05-21 LAB — COMPREHENSIVE METABOLIC PANEL
ALT: 33 U/L (ref 0–44)
AST: 20 U/L (ref 15–41)
Albumin: 3 g/dL — ABNORMAL LOW (ref 3.5–5.0)
Alkaline Phosphatase: 83 U/L (ref 38–126)
Anion gap: 11 (ref 5–15)
BUN: 19 mg/dL (ref 6–20)
CO2: 23 mmol/L (ref 22–32)
Calcium: 8.9 mg/dL (ref 8.9–10.3)
Chloride: 100 mmol/L (ref 98–111)
Creatinine, Ser: 1.02 mg/dL — ABNORMAL HIGH (ref 0.44–1.00)
GFR, Estimated: >60 mL/min (ref >60)
Glucose, Bld: 267 mg/dL — ABNORMAL HIGH (ref 70–99)
Potassium: 3.9 mmol/L (ref 3.5–5.1)
Sodium: 134 mmol/L — ABNORMAL LOW (ref 135–145)
Total Bilirubin: 0.4 mg/dL (ref 0.3–1.2)
Total Protein: 6.6 g/dL (ref 6.5–8.1)

## 2023-05-21 LAB — CBC
HCT: 39.4 % (ref 36.0–46.0)
Hemoglobin: 13.3 g/dL (ref 12.0–15.0)
MCH: 28.7 pg (ref 26.0–34.0)
MCHC: 33.8 g/dL (ref 30.0–36.0)
MCV: 84.9 fL (ref 80.0–100.0)
Platelets: 281 10*3/uL (ref 150–400)
RBC: 4.64 MIL/uL (ref 3.87–5.11)
RDW: 13.6 % (ref 11.5–15.5)
WBC: 16.8 10*3/uL — ABNORMAL HIGH (ref 4.0–10.5)
nRBC: 0 % (ref 0.0–0.2)

## 2023-05-21 LAB — I-STAT CHEM 8, ED
BUN: 21 mg/dL — ABNORMAL HIGH (ref 6–20)
Calcium, Ion: 1.08 mmol/L — ABNORMAL LOW (ref 1.15–1.40)
Chloride: 102 mmol/L (ref 98–111)
Creatinine, Ser: 0.9 mg/dL (ref 0.44–1.00)
Glucose, Bld: 270 mg/dL — ABNORMAL HIGH (ref 70–99)
HCT: 41 % (ref 36.0–46.0)
Hemoglobin: 13.9 g/dL (ref 12.0–15.0)
Potassium: 4.1 mmol/L (ref 3.5–5.1)
Sodium: 135 mmol/L (ref 135–145)
TCO2: 23 mmol/L (ref 22–32)

## 2023-05-21 LAB — GLUCOSE, CAPILLARY
Glucose-Capillary: 245 mg/dL — ABNORMAL HIGH (ref 70–99)
Glucose-Capillary: 297 mg/dL — ABNORMAL HIGH (ref 70–99)

## 2023-05-21 MED ORDER — ASPIRIN 81 MG PO TBEC
81.0000 mg | DELAYED_RELEASE_TABLET | Freq: Every day | ORAL | Status: DC
  Administered 2023-05-22: 81 mg via ORAL
  Filled 2023-05-21: qty 1

## 2023-05-21 MED ORDER — ONDANSETRON HCL 4 MG/2ML IJ SOLN
4.0000 mg | Freq: Once | INTRAMUSCULAR | Status: AC
  Administered 2023-05-21: 4 mg via INTRAVENOUS
  Filled 2023-05-21: qty 2

## 2023-05-21 MED ORDER — SODIUM CHLORIDE 0.9 % IV BOLUS
1000.0000 mL | Freq: Once | INTRAVENOUS | Status: AC
  Administered 2023-05-21: 1000 mL via INTRAVENOUS

## 2023-05-21 MED ORDER — INSULIN ASPART 100 UNIT/ML IJ SOLN
0.0000 [IU] | Freq: Every day | INTRAMUSCULAR | Status: DC
  Administered 2023-05-22: 2 [IU] via SUBCUTANEOUS

## 2023-05-21 MED ORDER — TICAGRELOR 90 MG PO TABS
90.0000 mg | ORAL_TABLET | Freq: Two times a day (BID) | ORAL | Status: DC
  Administered 2023-05-22 (×2): 90 mg via ORAL
  Filled 2023-05-21 (×2): qty 1

## 2023-05-21 MED ORDER — ALBUTEROL SULFATE (2.5 MG/3ML) 0.083% IN NEBU: 2.5000 mg | INHALATION_SOLUTION | RESPIRATORY_TRACT | Status: DC | PRN

## 2023-05-21 MED ORDER — METFORMIN HCL 1000 MG PO TABS: 1000.0000 mg | ORAL_TABLET | Freq: Two times a day (BID) | ORAL | 1 refills | Status: DC

## 2023-05-21 MED ORDER — INSULIN ASPART 100 UNIT/ML IJ SOLN
0.0000 [IU] | Freq: Three times a day (TID) | INTRAMUSCULAR | Status: DC
  Administered 2023-05-22 (×2): 3 [IU] via SUBCUTANEOUS

## 2023-05-21 MED ORDER — VANCOMYCIN HCL IN DEXTROSE 1-5 GM/200ML-% IV SOLN: 1000.0000 mg | Freq: Once | INTRAVENOUS | Status: DC

## 2023-05-21 MED ORDER — OXYCODONE HCL 5 MG PO TABS: 5.0000 mg | ORAL_TABLET | ORAL | Status: DC | PRN

## 2023-05-21 MED ORDER — ONDANSETRON HCL 4 MG/2ML IJ SOLN: 4.0000 mg | Freq: Four times a day (QID) | INTRAMUSCULAR | Status: DC | PRN

## 2023-05-21 MED ORDER — METOPROLOL TARTRATE 25 MG PO TABS: 25.0000 mg | ORAL_TABLET | Freq: Two times a day (BID) | ORAL | 1 refills | Status: DC

## 2023-05-21 MED ORDER — METOPROLOL TARTRATE 25 MG PO TABS
25.0000 mg | ORAL_TABLET | Freq: Two times a day (BID) | ORAL | Status: DC
  Administered 2023-05-22: 25 mg via ORAL
  Filled 2023-05-21: qty 1

## 2023-05-21 MED ORDER — SODIUM CHLORIDE 0.9 % IV SOLN
1.0000 g | Freq: Every day | INTRAVENOUS | Status: DC
  Administered 2023-05-22: 1 g via INTRAVENOUS
  Filled 2023-05-21: qty 10

## 2023-05-21 MED ORDER — ACETAMINOPHEN 325 MG PO TABS
650.0000 mg | ORAL_TABLET | Freq: Four times a day (QID) | ORAL | Status: DC | PRN
  Administered 2023-05-22: 650 mg via ORAL
  Filled 2023-05-21: qty 2

## 2023-05-21 MED ORDER — ATORVASTATIN CALCIUM 80 MG PO TABS: 80.0000 mg | ORAL_TABLET | Freq: Every day | ORAL | 1 refills | Status: DC

## 2023-05-21 MED ORDER — HYDROMORPHONE HCL 1 MG/ML IJ SOLN
1.0000 mg | Freq: Once | INTRAMUSCULAR | Status: AC
  Administered 2023-05-21: 1 mg via INTRAVENOUS
  Filled 2023-05-21: qty 1

## 2023-05-21 MED ORDER — TICAGRELOR 90 MG PO TABS: 90.0000 mg | ORAL_TABLET | Freq: Two times a day (BID) | ORAL | 1 refills | Status: DC

## 2023-05-21 MED ORDER — SULFAMETHOXAZOLE-TRIMETHOPRIM 800-160 MG PO TABS: 1.0000 | ORAL_TABLET | Freq: Two times a day (BID) | ORAL | 0 refills | Status: DC

## 2023-05-21 MED ORDER — SENNOSIDES-DOCUSATE SODIUM 8.6-50 MG PO TABS: 1.0000 | ORAL_TABLET | Freq: Every evening | ORAL | Status: DC | PRN

## 2023-05-21 MED ORDER — NICOTINE POLACRILEX 2 MG MT GUM: 2.0000 mg | CHEWING_GUM | OROMUCOSAL | Status: DC | PRN

## 2023-05-21 MED ORDER — ENOXAPARIN SODIUM 40 MG/0.4ML IJ SOSY
40.0000 mg | PREFILLED_SYRINGE | Freq: Every day | INTRAMUSCULAR | Status: DC
  Administered 2023-05-22: 40 mg via SUBCUTANEOUS
  Filled 2023-05-21: qty 0.4

## 2023-05-21 MED ORDER — SODIUM CHLORIDE 0.9% FLUSH
3.0000 mL | Freq: Two times a day (BID) | INTRAVENOUS | Status: DC
  Administered 2023-05-22: 3 mL via INTRAVENOUS

## 2023-05-21 MED ORDER — VANCOMYCIN HCL 2000 MG/400ML IV SOLN
2000.0000 mg | Freq: Once | INTRAVENOUS | Status: AC
  Administered 2023-05-21: 2000 mg via INTRAVENOUS
  Filled 2023-05-21 (×2): qty 400

## 2023-05-21 MED ORDER — ASPIRIN 81 MG PO TBEC: 81.0000 mg | DELAYED_RELEASE_TABLET | Freq: Every day | ORAL | 1 refills | Status: DC

## 2023-05-21 MED ORDER — ACETAMINOPHEN 650 MG RE SUPP: 650.0000 mg | Freq: Four times a day (QID) | RECTAL | Status: DC | PRN

## 2023-05-21 MED ORDER — VANCOMYCIN HCL 750 MG/150ML IV SOLN: 750.0000 mg | Freq: Two times a day (BID) | INTRAVENOUS | Status: DC

## 2023-05-21 MED ORDER — VANCOMYCIN HCL 750 MG/150ML IV SOLN
750.0000 mg | Freq: Two times a day (BID) | INTRAVENOUS | Status: DC
  Administered 2023-05-22: 750 mg via INTRAVENOUS
  Filled 2023-05-21 (×2): qty 150

## 2023-05-21 MED ORDER — ATORVASTATIN CALCIUM 80 MG PO TABS
80.0000 mg | ORAL_TABLET | Freq: Every day | ORAL | Status: DC
  Administered 2023-05-22: 80 mg via ORAL
  Filled 2023-05-21: qty 1

## 2023-05-21 MED ORDER — ONDANSETRON HCL 4 MG PO TABS: 4.0000 mg | ORAL_TABLET | Freq: Four times a day (QID) | ORAL | Status: DC | PRN

## 2023-05-21 NOTE — Discharge Summary (Signed)
Physician Discharge Summary  Kathleen Marks UEA:[id number] DOB: July 12, 1971 DOA: 05/17/2023  PCP: Glenna Durand, PA-C  Admit date: 05/17/2023 Discharge date: 05/21/2023  Admitted From: Home Disposition:  AMA  Brief/Interim Summary:  See the details in my progress note from today   Discharge Diagnoses:  Principal Problem:   Facial cellulitis Active Problems:   CAD S/P percutaneous coronary angioplasty   Type 2 diabetes mellitus (HCC)   Non-ST elevation (NSTEMI) myocardial infarction (HCC)   Acute on chronic combined systolic and diastolic CHF (congestive heart failure) [facility])    Discharge Instructions  Discharge Instructions     Amb Referral to Cardiac Rehabilitation   Complete by: As directed    Diagnosis:  Coronary Stents NSTEMI PTCA     After initial evaluation and assessments completed: Virtual Based Care may be provided alone or in conjunction with Phase 2 Cardiac Rehab based on patient barriers.: Yes   Intensive Cardiac Rehabilitation (ICR) [facility] location only OR Traditional Cardiac Rehabilitation (TCR) *If criteria for ICR are not met will enroll in TCR [facility] only): Yes      Allergies as of 05/21/2023       Reactions   Iodinated Contrast Media Shortness Of Breath, Anaphylaxis   Penicillins Hives   Bupropion Itching   Pt states she has psychological reaction to generic Wellbutrin   Clindamycin/lincomycin Nausea And Vomiting   Tetanus Toxoids Itching, Other (See Comments)   Pain/knots in arms   Azithromycin Nausea And Vomiting, Nausea Only   States she can tolerate.    Blue Dyes (parenteral) Rash        Medication List     TAKE these medications    acetaminophen 500 MG tablet Commonly known as: TYLENOL Take 500-1,000 mg by mouth every 6 (six) hours as needed for moderate pain.   albuterol 108 (90 Base) MCG/ACT inhaler Commonly known as: VENTOLIN HFA Inhale 2 puffs into the lungs every 4 (four) hours as needed for wheezing or shortness of  breath.   aspirin EC 81 MG tablet Take 1 tablet (81 mg total) by mouth daily. Swallow whole. Start taking on: May 22, 2023   atorvastatin 80 MG tablet Commonly known as: LIPITOR Take 1 tablet (80 mg total) by mouth daily. Start taking on: May 22, 2023   ibuprofen 200 MG tablet Commonly known as: ADVIL Take 800-1,200 mg by mouth 2 (two) times daily as needed for moderate pain (pain).   metFORMIN 1000 MG tablet Commonly known as: GLUCOPHAGE Take 1 tablet (1,000 mg total) by mouth 2 (two) times daily with a meal.   metoprolol tartrate 25 MG tablet Commonly known as: LOPRESSOR Take 1 tablet (25 mg total) by mouth 2 (two) times daily.   sulfamethoxazole-trimethoprim 800-160 MG tablet Commonly known as: BACTRIM DS Take 1 tablet by mouth 2 (two) times daily for 7 days.   ticagrelor 90 MG Tabs tablet Commonly known as: BRILINTA Take 1 tablet (90 mg total) by mouth 2 (two) times daily.        Follow-up Information     Carlos Levering, NP Follow up on 06/02/2023.   Specialty: Cardiology Why: at 1:55pm for your cardiology follow up appt Contact information: [address] [place] [address] [phone]                Allergies  Allergen Reactions   Iodinated Contrast Media Shortness Of Breath and Anaphylaxis   Penicillins Hives   Bupropion Itching    Pt states she has psychological reaction to  generic Wellbutrin   Clindamycin/Lincomycin Nausea And Vomiting   Tetanus Toxoids Itching and Other (See Comments)    Pain/knots in arms    Azithromycin Nausea And Vomiting and Nausea Only    States she can tolerate.    Blue Dyes (Parenteral) Rash    Consultations:    Procedures/Studies: CARDIAC CATHETERIZATION  Result Date: 05/19/2023   2nd Diag lesion is 99% stenosed.   Mid LAD-1 lesion is 90% stenosed.   Mid LAD-2 lesion is 40% stenosed.   Dist LAD lesion is 40% stenosed.   2nd Mrg lesion is 20% stenosed.   Lat 2nd Mrg lesion is 90%  stenosed.   Prox RCA lesion is 100% stenosed.   A drug-eluting stent was successfully placed using a SYNERGY [id number] [id number].   Balloon angioplasty was performed using a BALLN EMERGE MR [id number].   Post intervention, there is a 0% residual stenosis.   Post intervention, there is a 50% residual stenosis.   LV end diastolic pressure is normal.   Recommend uninterrupted dual antiplatelet therapy with Aspirin 81mg  daily and Ticagrelor 90mg  twice daily for a minimum of 12 months (ACS-Class I recommendation). 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 Subtotal second diagonal. Stents in OM2 are still patent. 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 Aggressive risk factor modification. Guideline directed medical therapy for LV dysfunction.   CT MAXILLOFACIAL W CONTRAST  Result Date: 05/19/2023 CLINICAL DATA:  52 year old female with face cellulitis. Possible developing abscess along the left jawline on ultrasound yesterday. EXAM: CT MAXILLOFACIAL WITH CONTRAST TECHNIQUE: Multidetector CT imaging of the maxillofacial structures was performed with intravenous contrast. Multiplanar CT image reconstructions were also generated. RADIATION DOSE REDUCTION: This exam was performed according to the departmental dose-optimization program which includes automated exposure control, adjustment of the mA and/or kV according to patient size and/or use of iterative reconstruction technique. CONTRAST:  75mL OMNIPAQUE IOHEXOL 350 MG/ML SOLN status post 13 hour steroid premedication. No adverse events recorded. COMPARISON:  Head and neck ultrasound yesterday. FINDINGS: Osseous: Absent dentition. Mandible intact and normally located. Bilateral maxilla, zygoma, pterygoid, and nasal bones appear intact. Intact central skull base. Visible  cervical vertebrae appear intact and aligned, with bulky anterior C4-C5 endplate osteophytosis. Visible calvarium intact. Orbits: Intact orbital walls. Globes and intraorbital soft tissues appears symmetric and normal. Sinuses: Clear throughout. Soft tissues: Negative visible thyroid, larynx, pharynx, parapharyngeal spaces, retropharyngeal space. Sublingual space, right side submandibular, mastoid and parotid spaces are normal. On the left side there is superficial soft tissue swelling and stranding as well as nodular phlegmon lateral to the body of the left mandible. See series 3, image 65 and also coronal image 34. No soft tissue gas. No organized or drainable fluid collection there. Associated thickening of the left platysma and mild edema tracking into the left submandibular space. The submandibular gland appears to remain symmetric. Inflammation also tracks up the left masticator space to the level of the left parotid duct which is nondilated. The left parotid space appear spared. Major vascular structures in the bilateral neck and at the skull base appear to be enhancing and patent. Minimal cervical lymph node asymmetry on the left. No heterogeneous lymph nodes. Limited intracranial: Negative. IMPRESSION: 1. Left Facial Cellulitis with nodular phlegmon lateral to the body of the left mandible. Secondary inflammation in  the left submandibular and masticator spaces. But no abscess or drainable fluid. No other complicating features. 2. Absent dentition. Electronically Signed   By: Odessa Fleming M.D.   On: 05/19/2023 05:08   US SOFT TISSUE HEAD & NECK (NON-THYROID)  Result Date: 05/18/2023 CLINICAL DATA:  Facial cellulitis EXAM: ULTRASOUND OF HEAD/NECK SOFT TISSUES TECHNIQUE: Ultrasound examination of the head and neck soft tissues was performed in the area of clinical concern. COMPARISON:  None Available. FINDINGS: Along the anterior left jaw line, there is an ill-defined 2.2 x 1.3 x 2.3 cm area of somewhat  heterogeneous echogenicity, which correlates with the palpable abnormality. No fluid collection or cyst is seen. No internal color flow is noted in this area. IMPRESSION: Ill-defined 2.2 x 1.3 x 2.3 cm area of somewhat heterogeneous echogenicity along the anterior left jaw line, which correlates with the palpable abnormality. This is nonspecific, but could represent a phlegmon or developing abscess. Correlate with physical exam and consider CT maxillofacial with contrast for further evaluation if clinically indicated. Electronically Signed   By: Wiliam Ke M.D.   On: 05/18/2023 03:43   DG Chest 2 View  Result Date: 05/18/2023 CLINICAL DATA:  Atrial fibrillation and chest pain. EXAM: CHEST - 2 VIEW COMPARISON:  Portable chest 05/15/2023 FINDINGS: The heart size and mediastinal contours are within normal limits. A stent is again noted in the circumflex coronary artery. Both lungs are clear. The visualized skeletal structures are intact, with thoracic spondylosis and slight thoracic dextroscoliosis. IMPRESSION: 1. No evidence of acute chest disease or changes. 2. Circumflex coronary artery stent. Electronically Signed   By: Almira Bar M.D.   On: 05/18/2023 00:53   ECHOCARDIOGRAM COMPLETE  Result Date: 05/16/2023    ECHOCARDIOGRAM REPORT   Patient Name:   KANDYCE ROOSEVELT Date of Exam: 05/16/2023 Medical Rec #:  [id number]         Height:       63.0 in Accession #:    [id number]        Weight:       197.3 lb Date of Birth:  1971-10-28         BSA:          1.923 m Patient Age:    52 years          BP:           119/88 mmHg Patient Gender: F                 HR:           89 bpm. Exam Location:  Inpatient Procedure: 2D Echo, Cardiac Doppler, Color Doppler and Intracardiac            Opacification Agent Indications:    Arrhythmia  History:        Patient has no prior history of Echocardiogram examinations.                 Previous Myocardial Infarction, Arrythmias:Atrial Fibrillation;                 Risk  Factors:Diabetes.  Sonographer:    Darlys Gales Referring Phys: [id number] VASUNDHRA RATHORE IMPRESSIONS  1. There is no left ventricular thrombus (Definity contrast was used), but there is apical swirling/slow flow. Left ventricular ejection fraction, by estimation, is 30 to 35%. The left ventricle has moderately decreased function. The left ventricle demonstrates regional wall motion abnormalities (see scoring diagram/findings for description). Left ventricular diastolic parameters are consistent with Grade I diastolic dysfunction (  impaired relaxation). There is mild dyskinesis of the left ventricular, apical anteroseptal wall and anterior wall. There is severe hypokinesis of the left ventricular, mid-apical anteroseptal wall and anterior wall.  2. Right ventricular systolic function is normal. The right ventricular size is normal. Tricuspid regurgitation signal is inadequate for assessing PA pressure.  3. The mitral valve is normal in structure. No evidence of mitral valve regurgitation.  4. The aortic valve is tricuspid. Aortic valve regurgitation is not visualized.  5. The inferior vena cava is normal in size with greater than 50% respiratory variability, suggesting right atrial pressure of 3 mmHg. FINDINGS  Left Ventricle: There is no left ventricular thrombus (Definity contrast was used), but there is apical swirling/slow flow. Left ventricular ejection fraction, by estimation, is 30 to 35%. The left ventricle has moderately decreased function. The left ventricle demonstrates regional wall motion abnormalities. Mild dyskinesis of the left ventricular, apical anteroseptal wall and anterior wall. Severe hypokinesis of the left ventricular, mid-apical anteroseptal wall and anterior wall. Definity contrast agent was given IV to delineate the left ventricular endocardial borders. The left ventricular internal cavity size was normal in size. There is no left ventricular hypertrophy. Left ventricular diastolic  parameters are consistent with Grade I diastolic dysfunction (impaired relaxation). Normal left ventricular filling pressure.  LV Wall Scoring: The apical septal segment, apical anterior segment, and apex are dyskinetic. The mid anteroseptal segment, apical lateral segment, mid anterior segment, and apical inferior segment are hypokinetic. The antero-lateral wall, inferior wall, posterior wall, basal anteroseptal segment, mid inferoseptal segment, basal anterior segment, and basal inferoseptal segment are normal. Right Ventricle: The right ventricular size is normal. No increase in right ventricular wall thickness. Right ventricular systolic function is normal. Tricuspid regurgitation signal is inadequate for assessing PA pressure. Left Atrium: Left atrial size was normal in size. Right Atrium: Right atrial size was normal in size. Pericardium: There is no evidence of pericardial effusion. Mitral Valve: The mitral valve is normal in structure. No evidence of mitral valve regurgitation. Tricuspid Valve: The tricuspid valve is normal in structure. Tricuspid valve regurgitation is not demonstrated. Aortic Valve: The aortic valve is tricuspid. Aortic valve regurgitation is not visualized. Aortic valve mean gradient measures 4.0 mmHg. Aortic valve peak gradient measures 6.0 mmHg. Aortic valve area, by VTI measures 2.28 cm. Pulmonic Valve: The pulmonic valve was not well visualized. Pulmonic valve regurgitation is not visualized. No evidence of pulmonic stenosis. Aorta: The aortic root is normal in size and structure. Venous: The inferior vena cava is normal in size with greater than 50% respiratory variability, suggesting right atrial pressure of 3 mmHg. IAS/Shunts: No atrial level shunt detected by color flow Doppler.  LEFT VENTRICLE PLAX 2D LVIDd:         4.80 cm LVIDs:         3.60 cm LV PW:         0.90 cm LV IVS:        1.20 cm LVOT diam:     1.80 cm LV SV:         61 LV SV Index:   32 LVOT Area:     2.54 cm  LEFT  ATRIUM             Index        RIGHT ATRIUM          Index LA Vol (A2C):   26.0 ml 13.52 ml/m  RA Area:     9.85 cm LA Vol (A4C):   50.8  ml 26.42 ml/m  RA Volume:   21.30 ml 11.08 ml/m LA Biplane Vol: 36.9 ml 19.19 ml/m  AORTIC VALVE AV Area (Vmax):    2.19 cm AV Area (Vmean):   2.09 cm AV Area (VTI):     2.28 cm AV Vmax:           122.00 cm/s AV Vmean:          96.100 cm/s AV VTI:            0.266 m AV Peak Grad:      6.0 mmHg AV Mean Grad:      4.0 mmHg LVOT Vmax:         105.00 cm/s LVOT Vmean:        79.000 cm/s LVOT VTI:          0.238 m LVOT/AV VTI ratio: 0.89  AORTA Ao Root diam: 2.70 cm MITRAL VALVE MV Area (PHT): 5.42 cm     SHUNTS MV Decel Time: 140 msec     Systemic VTI:  0.24 m MV E velocity: 60.80 cm/s   Systemic Diam: 1.80 cm MV A velocity: 116.00 cm/s MV E/A ratio:  0.52 Mihai Croitoru MD Electronically signed by Thurmon Fair MD Signature Date/Time: 05/16/2023/10:00:44 AM    Final    DG Chest Port 1 View  Result Date: 05/15/2023 CLINICAL DATA:  Shortness of breath. EXAM: PORTABLE CHEST 1 VIEW COMPARISON:  March 01, 2019 FINDINGS: The heart size and mediastinal contours are within normal limits. A coronary artery stent is noted. Both lungs are clear. The visualized skeletal structures are unremarkable. IMPRESSION: No active disease. Electronically Signed   By: Aram Candela M.D.   On: 05/15/2023 21:13      Subjective:   Discharge Exam: Vitals:   05/21/23 0803 05/21/23 1457  BP:  (!) 104/56  Pulse:  93  Resp:  17  Temp:  98.4 F (36.9 C)  SpO2: 99%    Vitals:   05/21/23 0507 05/21/23 0756 05/21/23 0803 05/21/23 1457  BP: 109/65 116/74  (!) 104/56  Pulse: 82   93  Resp: 18 17  17   Temp: 98.5 F (36.9 C) 98 F (36.7 C)  98.4 F (36.9 C)  TempSrc: Oral Oral  Oral  SpO2: 95%  99%   Weight:      Height:        General: Pt is alert, awake, not in acute distress Cardiovascular: RRR, S1/S2 +, no rubs, no gallops Respiratory: CTA bilaterally, no wheezing, no  rhonchi Abdominal: Soft, NT, ND, bowel sounds + Extremities: no edema, no cyanosis    The results of significant diagnostics from this hospitalization (including imaging, microbiology, ancillary and laboratory) are listed below for reference.     Microbiology: Recent Results (from the past 240 hour(s))  MRSA Next Gen by PCR, Nasal     Status: Abnormal   Collection Time: 05/16/23 12:28 AM   Specimen: Nasal Mucosa; Nasal Swab  Result Value Ref Range Status   MRSA by PCR Next Gen DETECTED (A) NOT DETECTED Final    Comment: RESULT CALLED TO, READ BACK BY AND VERIFIED WITH: A COOPER,RN@0231  05/16/23 MK (NOTE) The GeneXpert MRSA Assay (FDA approved for NASAL specimens only), is one component of a comprehensive MRSA colonization surveillance program. It is not intended to diagnose MRSA infection nor to guide or monitor treatment for MRSA infections. Test performance is not FDA approved in patients less than 7 years old. Performed at [facility], [address], [place], [place] [address]   Culture, blood (Routine X 2) w Reflex to ID Panel     Status: None (Preliminary result)   Collection Time: 05/18/23 12:48 PM   Specimen: BLOOD LEFT HAND  Result Value Ref Range Status   Specimen Description BLOOD LEFT HAND  Final   Special Requests   Final    BOTTLES DRAWN AEROBIC AND ANAEROBIC Blood Culture adequate volume   Culture   Final    NO GROWTH 3 DAYS Performed at [facility], [address] [place], [place] [address]    Report Status PENDING  Incomplete  Culture, blood (Routine X 2) w Reflex to ID Panel     Status: None (Preliminary result)   Collection Time: 05/18/23 12:48 PM   Specimen: BLOOD RIGHT HAND  Result Value Ref Range Status   Specimen Description BLOOD RIGHT HAND  Final   Special Requests   Final    BOTTLES DRAWN AEROBIC AND ANAEROBIC Blood Culture results may not be optimal due to an excessive volume of blood received in culture bottles   Culture    Final    NO GROWTH 3 DAYS Performed at [facility], [address], [place], [place] [address]    Report Status PENDING  Incomplete  Aerobic Culture w Gram Stain (superficial specimen)     Status: None (Preliminary result)   Collection Time: 05/20/23  2:00 AM   Specimen: Wound  Result Value Ref Range Status   Specimen Description WOUND  Final   Special Requests FACE  Final   Gram Stain NO WBC SEEN NO ORGANISMS SEEN   Final   Culture   Final    RARE STAPHYLOCOCCUS AUREUS SUSCEPTIBILITIES TO FOLLOW Performed at [facility], [address] [place], [place] [address]    Report Status PENDING  Incomplete     Labs: BNP (last 3 results) No results for input(s): "BNP" in the last 8760 hours. Basic Metabolic Panel: Recent Labs  Lab 05/16/23 0622 05/17/23 1954 05/18/23 0236 05/19/23 0248 05/19/23 1808 05/20/23 0737  NA 132* 134* 132* 133*  --  133*  K 4.7 3.7 3.5 4.5  --  3.6  CL 101 97* 101 102  --  101  CO2 20* 21* 20* 20*  --  20*  GLUCOSE 485* 384* 300* 358*  --  317*  BUN 11 16 10 14   --  18  CREATININE 0.78 0.87 0.69 0.70 0.81 0.82  CALCIUM 8.6* 9.1 8.6* 8.8*  --  8.4*  MG  --   --  1.6*  --   --   --    Liver Function Tests: No results for input(s): "AST", "ALT", "ALKPHOS", "BILITOT", "PROT", "ALBUMIN" in the last 168 hours. No results for input(s): "LIPASE", "AMYLASE" in the last 168 hours. No results for input(s): "AMMONIA" in the last 168 hours. CBC: Recent Labs  Lab 05/18/23 0236 05/19/23 0248 05/19/23 1808 05/20/23 0737 05/21/23 0202  WBC 18.1* 19.4* 21.3* 21.6* 16.8*  HGB 15.1* 14.8 15.9* 13.7 13.3  HCT 44.1 43.2 45.3 39.9 39.4  MCV 82.9 83.4 85.3 85.8 84.9  PLT 282 272 315 291 281   Cardiac Enzymes: No results for input(s): "CKTOTAL", "CKMB", "CKMBINDEX", "TROPONINI" in the last 168 hours. BNP: Invalid input(s): "POCBNP" CBG: Recent Labs  Lab 05/20/23 1157 05/20/23 1650 05/20/23 2045 05/21/23 0757 05/21/23 1234  GLUCAP 262*  357* 188* 245* 297*   D-Dimer No results for input(s): "DDIMER" in the last 72 hours. Hgb  A1c No results for input(s): "HGBA1C" in the last 72 hours. Lipid Profile No results for input(s): "CHOL", "HDL", "LDLCALC", "TRIG", "CHOLHDL", "LDLDIRECT" in the last 72 hours. Thyroid function studies No results for input(s): "TSH", "T4TOTAL", "T3FREE", "THYROIDAB" in the last 72 hours.  Invalid input(s): "FREET3" Anemia work up No results for input(s): "VITAMINB12", "FOLATE", "FERRITIN", "TIBC", "IRON", "RETICCTPCT" in the last 72 hours. Urinalysis No results found for: "COLORURINE", "APPEARANCEUR", "LABSPEC", "PHURINE", "GLUCOSEU", "HGBUR", "BILIRUBINUR", "KETONESUR", "PROTEINUR", "UROBILINOGEN", "NITRITE", "LEUKOCYTESUR" Sepsis Labs Recent Labs  Lab 05/19/23 0248 05/19/23 1808 05/20/23 0737 05/21/23 0202  WBC 19.4* 21.3* 21.6* 16.8*   Microbiology Recent Results (from the past 240 hour(s))  MRSA Next Gen by PCR, Nasal     Status: Abnormal   Collection Time: 05/16/23 12:28 AM   Specimen: Nasal Mucosa; Nasal Swab  Result Value Ref Range Status   MRSA by PCR Next Gen DETECTED (A) NOT DETECTED Final    Comment: RESULT CALLED TO, READ BACK BY AND VERIFIED WITH: A COOPER,RN@0231  05/16/23 MK (NOTE) The GeneXpert MRSA Assay (FDA approved for NASAL specimens only), is one component of a comprehensive MRSA colonization surveillance program. It is not intended to diagnose MRSA infection nor to guide or monitor treatment for MRSA infections. Test performance is not FDA approved in patients less than 23 years old. Performed at [facility], [address] [place], [place] [address]   Culture, blood (Routine X 2) w Reflex to ID Panel     Status: None (Preliminary result)   Collection Time: 05/18/23 12:48 PM   Specimen: BLOOD LEFT HAND  Result Value Ref Range Status   Specimen Description BLOOD LEFT HAND  Final   Special Requests   Final    BOTTLES DRAWN AEROBIC AND ANAEROBIC Blood  Culture adequate volume   Culture   Final    NO GROWTH 3 DAYS Performed at [facility], [address], [place], [place] [address]    Report Status PENDING  Incomplete  Culture, blood (Routine X 2) w Reflex to ID Panel     Status: None (Preliminary result)   Collection Time: 05/18/23 12:48 PM   Specimen: BLOOD RIGHT HAND  Result Value Ref Range Status   Specimen Description BLOOD RIGHT HAND  Final   Special Requests   Final    BOTTLES DRAWN AEROBIC AND ANAEROBIC Blood Culture results may not be optimal due to an excessive volume of blood received in culture bottles   Culture   Final    NO GROWTH 3 DAYS Performed at [facility], [address], [place], [place] [address]    Report Status PENDING  Incomplete  Aerobic Culture w Gram Stain (superficial specimen)     Status: None (Preliminary result)   Collection Time: 05/20/23  2:00 AM   Specimen: Wound  Result Value Ref Range Status   Specimen Description WOUND  Final   Special Requests FACE  Final   Gram Stain NO WBC SEEN NO ORGANISMS SEEN   Final   Culture   Final    RARE STAPHYLOCOCCUS AUREUS SUSCEPTIBILITIES TO FOLLOW Performed at [facility], [address], [place], [place] [address]    Report Status PENDING  Incomplete    Please note: You were cared for by a hospitalist during your hospital stay. Once you are discharged, your primary care physician will handle any further medical issues. Please note that NO REFILLS for any discharge medications will be authorized once you are discharged, as it  is imperative that you return to your primary care physician (or establish a relationship with a primary care physician if you do not have one) for your post hospital discharge needs so that they can reassess your need for medications and monitor your lab values.    Time coordinating discharge: 40 minutes  SIGNED:   Burnadette Pop, MD  [facility] 05/21/2023, 5:49 PM Pager [phone]  If 7PM-7AM,  please contact night-coverage [email or web address] [facility] Physician Discharge Summary  Yeily Kocur Barret ZHY:[id number] DOB: July 02, 1971 DOA: 05/17/2023  PCP: Glenna Durand, PA-C  Admit date: 05/17/2023 Discharge date: 05/21/2023  Admitted From: Home Disposition:  Home  Discharge Condition:Stable CODE STATUS:FULL, DNR, Comfort Care Diet recommendation: Heart Healthy / Carb Modified / Regular / Dysphagia   Brief/Interim Summary:   Following problems were addressed during the hospitalization:   Discharge Diagnoses:  Principal Problem:   Facial cellulitis Active Problems:   CAD S/P percutaneous coronary angioplasty   Type 2 diabetes mellitus (HCC)   Non-ST elevation (NSTEMI) myocardial infarction (HCC)   Acute on chronic combined systolic and diastolic CHF (congestive heart failure) [facility])    Discharge Instructions  Discharge Instructions     Amb Referral to Cardiac Rehabilitation   Complete by: As directed    Diagnosis:  Coronary Stents NSTEMI PTCA     After initial evaluation and assessments completed: Virtual Based Care may be provided alone or in conjunction with Phase 2 Cardiac Rehab based on patient barriers.: Yes   Intensive Cardiac Rehabilitation (ICR) [facility] location only OR Traditional Cardiac Rehabilitation (TCR) *If criteria for ICR are not met will enroll in TCR [facility] only): Yes      Allergies as of 05/21/2023       Reactions   Iodinated Contrast Media Shortness Of Breath, Anaphylaxis   Penicillins Hives   Bupropion Itching   Pt states she has psychological reaction to generic Wellbutrin   Clindamycin/lincomycin Nausea And Vomiting   Tetanus Toxoids Itching, Other (See Comments)   Pain/knots in arms   Azithromycin Nausea And Vomiting, Nausea Only   States she can tolerate.    Blue Dyes (parenteral) Rash        Medication List     TAKE these medications    acetaminophen 500 MG tablet Commonly known as: TYLENOL Take 500-1,000 mg by mouth  every 6 (six) hours as needed for moderate pain.   albuterol 108 (90 Base) MCG/ACT inhaler Commonly known as: VENTOLIN HFA Inhale 2 puffs into the lungs every 4 (four) hours as needed for wheezing or shortness of breath.   aspirin EC 81 MG tablet Take 1 tablet (81 mg total) by mouth daily. Swallow whole. Start taking on: May 22, 2023   atorvastatin 80 MG tablet Commonly known as: LIPITOR Take 1 tablet (80 mg total) by mouth daily. Start taking on: May 22, 2023   ibuprofen 200 MG tablet Commonly known as: ADVIL Take 800-1,200 mg by mouth 2 (two) times daily as needed for moderate pain (pain).   metFORMIN 1000 MG tablet Commonly known as: GLUCOPHAGE Take 1 tablet (1,000 mg total) by mouth 2 (two) times daily with a meal.   metoprolol tartrate 25 MG tablet Commonly known as: LOPRESSOR Take 1 tablet (25 mg total) by mouth 2 (two) times daily.   sulfamethoxazole-trimethoprim 800-160 MG tablet Commonly known as: BACTRIM DS Take 1 tablet by mouth 2 (two) times daily for 7 days.   ticagrelor 90 MG Tabs tablet Commonly known as: BRILINTA Take 1 tablet (90 mg total)  by mouth 2 (two) times daily.        Follow-up Information     Carlos Levering, NP Follow up on 06/02/2023.   Specialty: Cardiology Why: at 1:55pm for your cardiology follow up appt Contact information: [address] [place] [address] [phone]                Allergies  Allergen Reactions   Iodinated Contrast Media Shortness Of Breath and Anaphylaxis   Penicillins Hives   Bupropion Itching    Pt states she has psychological reaction to generic Wellbutrin   Clindamycin/Lincomycin Nausea And Vomiting   Tetanus Toxoids Itching and Other (See Comments)    Pain/knots in arms    Azithromycin Nausea And Vomiting and Nausea Only    States she can tolerate.    Blue Dyes (Parenteral) Rash    Consultations:    Procedures/Studies: CARDIAC CATHETERIZATION  Result Date:  05/19/2023   2nd Diag lesion is 99% stenosed.   Mid LAD-1 lesion is 90% stenosed.   Mid LAD-2 lesion is 40% stenosed.   Dist LAD lesion is 40% stenosed.   2nd Mrg lesion is 20% stenosed.   Lat 2nd Mrg lesion is 90% stenosed.   Prox RCA lesion is 100% stenosed.   A drug-eluting stent was successfully placed using a SYNERGY [id number] [id number].   Balloon angioplasty was performed using a BALLN EMERGE MR [id number].   Post intervention, there is a 0% residual stenosis.   Post intervention, there is a 50% residual stenosis.   LV end diastolic pressure is normal.   Recommend uninterrupted dual antiplatelet therapy with Aspirin 81mg  daily and Ticagrelor 90mg  twice daily for a minimum of 12 months (ACS-Class I recommendation). 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 Subtotal second diagonal. Stents in OM2 are still patent. 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 Aggressive risk factor modification. Guideline directed medical therapy for LV dysfunction.   CT MAXILLOFACIAL W CONTRAST  Result Date: 05/19/2023 CLINICAL DATA:  52 year old female with face cellulitis. Possible developing abscess along the left jawline on ultrasound yesterday. EXAM: CT MAXILLOFACIAL WITH CONTRAST TECHNIQUE: Multidetector CT imaging of the maxillofacial structures was performed with intravenous contrast. Multiplanar CT image reconstructions were also generated. RADIATION DOSE REDUCTION: This exam was performed according to the departmental dose-optimization program which includes automated exposure control, adjustment of the mA and/or kV according to patient size and/or use of iterative reconstruction technique. CONTRAST:  75mL OMNIPAQUE IOHEXOL 350 MG/ML SOLN status post 13 hour steroid premedication. No adverse events recorded. COMPARISON:  Head  and neck ultrasound yesterday. FINDINGS: Osseous: Absent dentition. Mandible intact and normally located. Bilateral maxilla, zygoma, pterygoid, and nasal bones appear intact. Intact central skull base. Visible cervical vertebrae appear intact and aligned, with bulky anterior C4-C5 endplate osteophytosis. Visible calvarium intact. Orbits: Intact orbital walls. Globes and intraorbital soft tissues appears symmetric and normal. Sinuses: Clear throughout. Soft tissues: Negative visible thyroid, larynx, pharynx, parapharyngeal spaces, retropharyngeal space. Sublingual space, right side submandibular, mastoid and parotid spaces are normal. On the left side there is superficial soft tissue swelling and stranding as well as nodular phlegmon lateral to the body of the left mandible. See series 3, image 65 and also coronal image 34. No soft tissue gas. No organized or drainable fluid collection there. Associated thickening  of the left platysma and mild edema tracking into the left submandibular space. The submandibular gland appears to remain symmetric. Inflammation also tracks up the left masticator space to the level of the left parotid duct which is nondilated. The left parotid space appear spared. Major vascular structures in the bilateral neck and at the skull base appear to be enhancing and patent. Minimal cervical lymph node asymmetry on the left. No heterogeneous lymph nodes. Limited intracranial: Negative. IMPRESSION: 1. Left Facial Cellulitis with nodular phlegmon lateral to the body of the left mandible. Secondary inflammation in the left submandibular and masticator spaces. But no abscess or drainable fluid. No other complicating features. 2. Absent dentition. Electronically Signed   By: Odessa Fleming M.D.   On: 05/19/2023 05:08   US SOFT TISSUE HEAD & NECK (NON-THYROID)  Result Date: 05/18/2023 CLINICAL DATA:  Facial cellulitis EXAM: ULTRASOUND OF HEAD/NECK SOFT TISSUES TECHNIQUE: Ultrasound examination of the head  and neck soft tissues was performed in the area of clinical concern. COMPARISON:  None Available. FINDINGS: Along the anterior left jaw line, there is an ill-defined 2.2 x 1.3 x 2.3 cm area of somewhat heterogeneous echogenicity, which correlates with the palpable abnormality. No fluid collection or cyst is seen. No internal color flow is noted in this area. IMPRESSION: Ill-defined 2.2 x 1.3 x 2.3 cm area of somewhat heterogeneous echogenicity along the anterior left jaw line, which correlates with the palpable abnormality. This is nonspecific, but could represent a phlegmon or developing abscess. Correlate with physical exam and consider CT maxillofacial with contrast for further evaluation if clinically indicated. Electronically Signed   By: Wiliam Ke M.D.   On: 05/18/2023 03:43   DG Chest 2 View  Result Date: 05/18/2023 CLINICAL DATA:  Atrial fibrillation and chest pain. EXAM: CHEST - 2 VIEW COMPARISON:  Portable chest 05/15/2023 FINDINGS: The heart size and mediastinal contours are within normal limits. A stent is again noted in the circumflex coronary artery. Both lungs are clear. The visualized skeletal structures are intact, with thoracic spondylosis and slight thoracic dextroscoliosis. IMPRESSION: 1. No evidence of acute chest disease or changes. 2. Circumflex coronary artery stent. Electronically Signed   By: Almira Bar M.D.   On: 05/18/2023 00:53   ECHOCARDIOGRAM COMPLETE  Result Date: 05/16/2023    ECHOCARDIOGRAM REPORT   Patient Name:   Kathleen Marks Date of Exam: 05/16/2023 Medical Rec #:  [id number]         Height:       63.0 in Accession #:    [id number]        Weight:       197.3 lb Date of Birth:  Jul 12, 1971         BSA:          1.923 m Patient Age:    52 years          BP:           119/88 mmHg Patient Gender: F                 HR:           89 bpm. Exam Location:  Inpatient Procedure: 2D Echo, Cardiac Doppler, Color Doppler and Intracardiac            Opacification Agent  Indications:    Arrhythmia  History:        Patient has no prior history of Echocardiogram examinations.  Previous Myocardial Infarction, Arrythmias:Atrial Fibrillation;                 Risk Factors:Diabetes.  Sonographer:    Darlys Gales Referring Phys: [id number] VASUNDHRA RATHORE IMPRESSIONS  1. There is no left ventricular thrombus (Definity contrast was used), but there is apical swirling/slow flow. Left ventricular ejection fraction, by estimation, is 30 to 35%. The left ventricle has moderately decreased function. The left ventricle demonstrates regional wall motion abnormalities (see scoring diagram/findings for description). Left ventricular diastolic parameters are consistent with Grade I diastolic dysfunction (impaired relaxation). There is mild dyskinesis of the left ventricular, apical anteroseptal wall and anterior wall. There is severe hypokinesis of the left ventricular, mid-apical anteroseptal wall and anterior wall.  2. Right ventricular systolic function is normal. The right ventricular size is normal. Tricuspid regurgitation signal is inadequate for assessing PA pressure.  3. The mitral valve is normal in structure. No evidence of mitral valve regurgitation.  4. The aortic valve is tricuspid. Aortic valve regurgitation is not visualized.  5. The inferior vena cava is normal in size with greater than 50% respiratory variability, suggesting right atrial pressure of 3 mmHg. FINDINGS  Left Ventricle: There is no left ventricular thrombus (Definity contrast was used), but there is apical swirling/slow flow. Left ventricular ejection fraction, by estimation, is 30 to 35%. The left ventricle has moderately decreased function. The left ventricle demonstrates regional wall motion abnormalities. Mild dyskinesis of the left ventricular, apical anteroseptal wall and anterior wall. Severe hypokinesis of the left ventricular, mid-apical anteroseptal wall and anterior wall. Definity contrast agent  was given IV to delineate the left ventricular endocardial borders. The left ventricular internal cavity size was normal in size. There is no left ventricular hypertrophy. Left ventricular diastolic parameters are consistent with Grade I diastolic dysfunction (impaired relaxation). Normal left ventricular filling pressure.  LV Wall Scoring: The apical septal segment, apical anterior segment, and apex are dyskinetic. The mid anteroseptal segment, apical lateral segment, mid anterior segment, and apical inferior segment are hypokinetic. The antero-lateral wall, inferior wall, posterior wall, basal anteroseptal segment, mid inferoseptal segment, basal anterior segment, and basal inferoseptal segment are normal. Right Ventricle: The right ventricular size is normal. No increase in right ventricular wall thickness. Right ventricular systolic function is normal. Tricuspid regurgitation signal is inadequate for assessing PA pressure. Left Atrium: Left atrial size was normal in size. Right Atrium: Right atrial size was normal in size. Pericardium: There is no evidence of pericardial effusion. Mitral Valve: The mitral valve is normal in structure. No evidence of mitral valve regurgitation. Tricuspid Valve: The tricuspid valve is normal in structure. Tricuspid valve regurgitation is not demonstrated. Aortic Valve: The aortic valve is tricuspid. Aortic valve regurgitation is not visualized. Aortic valve mean gradient measures 4.0 mmHg. Aortic valve peak gradient measures 6.0 mmHg. Aortic valve area, by VTI measures 2.28 cm. Pulmonic Valve: The pulmonic valve was not well visualized. Pulmonic valve regurgitation is not visualized. No evidence of pulmonic stenosis. Aorta: The aortic root is normal in size and structure. Venous: The inferior vena cava is normal in size with greater than 50% respiratory variability, suggesting right atrial pressure of 3 mmHg. IAS/Shunts: No atrial level shunt detected by color flow Doppler.  LEFT  VENTRICLE PLAX 2D LVIDd:         4.80 cm LVIDs:         3.60 cm LV PW:         0.90 cm LV IVS:  1.20 cm LVOT diam:     1.80 cm LV SV:         61 LV SV Index:   32 LVOT Area:     2.54 cm  LEFT ATRIUM             Index        RIGHT ATRIUM          Index LA Vol (A2C):   26.0 ml 13.52 ml/m  RA Area:     9.85 cm LA Vol (A4C):   50.8 ml 26.42 ml/m  RA Volume:   21.30 ml 11.08 ml/m LA Biplane Vol: 36.9 ml 19.19 ml/m  AORTIC VALVE AV Area (Vmax):    2.19 cm AV Area (Vmean):   2.09 cm AV Area (VTI):     2.28 cm AV Vmax:           122.00 cm/s AV Vmean:          96.100 cm/s AV VTI:            0.266 m AV Peak Grad:      6.0 mmHg AV Mean Grad:      4.0 mmHg LVOT Vmax:         105.00 cm/s LVOT Vmean:        79.000 cm/s LVOT VTI:          0.238 m LVOT/AV VTI ratio: 0.89  AORTA Ao Root diam: 2.70 cm MITRAL VALVE MV Area (PHT): 5.42 cm     SHUNTS MV Decel Time: 140 msec     Systemic VTI:  0.24 m MV E velocity: 60.80 cm/s   Systemic Diam: 1.80 cm MV A velocity: 116.00 cm/s MV E/A ratio:  0.52 Mihai Croitoru MD Electronically signed by Thurmon Fair MD Signature Date/Time: 05/16/2023/10:00:44 AM    Final    DG Chest Port 1 View  Result Date: 05/15/2023 CLINICAL DATA:  Shortness of breath. EXAM: PORTABLE CHEST 1 VIEW COMPARISON:  March 01, 2019 FINDINGS: The heart size and mediastinal contours are within normal limits. A coronary artery stent is noted. Both lungs are clear. The visualized skeletal structures are unremarkable. IMPRESSION: No active disease. Electronically Signed   By: Aram Candela M.D.   On: 05/15/2023 21:13      Subjective:   Discharge Exam: Vitals:   05/21/23 0803 05/21/23 1457  BP:  (!) 104/56  Pulse:  93  Resp:  17  Temp:  98.4 F (36.9 C)  SpO2: 99%    Vitals:   05/21/23 0507 05/21/23 0756 05/21/23 0803 05/21/23 1457  BP: 109/65 116/74  (!) 104/56  Pulse: 82   93  Resp: 18 17  17   Temp: 98.5 F (36.9 C) 98 F (36.7 C)  98.4 F (36.9 C)  TempSrc: Oral Oral  Oral   SpO2: 95%  99%   Weight:      Height:        General: Pt is alert, awake, not in acute distress Cardiovascular: RRR, S1/S2 +, no rubs, no gallops Respiratory: CTA bilaterally, no wheezing, no rhonchi Abdominal: Soft, NT, ND, bowel sounds + Extremities: no edema, no cyanosis    The results of significant diagnostics from this hospitalization (including imaging, microbiology, ancillary and laboratory) are listed below for reference.     Microbiology: Recent Results (from the past 240 hour(s))  MRSA Next Gen by PCR, Nasal     Status: Abnormal   Collection Time: 05/16/23 12:28 AM   Specimen: Nasal Mucosa; Nasal Swab  Result Value Ref Range Status   MRSA by PCR Next Gen DETECTED (A) NOT DETECTED Final    Comment: RESULT CALLED TO, READ BACK BY AND VERIFIED WITH: A COOPER,RN@0231  05/16/23 MK (NOTE) The GeneXpert MRSA Assay (FDA approved for NASAL specimens only), is one component of a comprehensive MRSA colonization surveillance program. It is not intended to diagnose MRSA infection nor to guide or monitor treatment for MRSA infections. Test performance is not FDA approved in patients less than 18 years old. Performed at [facility], [address], [place], [place] [address]   Culture, blood (Routine X 2) w Reflex to ID Panel     Status: None (Preliminary result)   Collection Time: 05/18/23 12:48 PM   Specimen: BLOOD LEFT HAND  Result Value Ref Range Status   Specimen Description BLOOD LEFT HAND  Final   Special Requests   Final    BOTTLES DRAWN AEROBIC AND ANAEROBIC Blood Culture adequate volume   Culture   Final    NO GROWTH 3 DAYS Performed at [facility], [address], [place], [place] [address]    Report Status PENDING  Incomplete  Culture, blood (Routine X 2) w Reflex to ID Panel     Status: None (Preliminary result)   Collection Time: 05/18/23 12:48 PM   Specimen: BLOOD RIGHT HAND  Result Value Ref Range Status   Specimen Description BLOOD RIGHT  HAND  Final   Special Requests   Final    BOTTLES DRAWN AEROBIC AND ANAEROBIC Blood Culture results may not be optimal due to an excessive volume of blood received in culture bottles   Culture   Final    NO GROWTH 3 DAYS Performed at [facility], [address] [place], [place] [address]    Report Status PENDING  Incomplete  Aerobic Culture w Gram Stain (superficial specimen)     Status: None (Preliminary result)   Collection Time: 05/20/23  2:00 AM   Specimen: Wound  Result Value Ref Range Status   Specimen Description WOUND  Final   Special Requests FACE  Final   Gram Stain NO WBC SEEN NO ORGANISMS SEEN   Final   Culture   Final    RARE STAPHYLOCOCCUS AUREUS SUSCEPTIBILITIES TO FOLLOW Performed at [facility], [address], [place], [place] [address]    Report Status PENDING  Incomplete     Labs: BNP (last 3 results) No results for input(s): "BNP" in the last 8760 hours. Basic Metabolic Panel: Recent Labs  Lab 05/16/23 0622 05/17/23 1954 05/18/23 0236 05/19/23 0248 05/19/23 1808 05/20/23 0737  NA 132* 134* 132* 133*  --  133*  K 4.7 3.7 3.5 4.5  --  3.6  CL 101 97* 101 102  --  101  CO2 20* 21* 20* 20*  --  20*  GLUCOSE 485* 384* 300* 358*  --  317*  BUN 11 16 10 14   --  18  CREATININE 0.78 0.87 0.69 0.70 0.81 0.82  CALCIUM 8.6* 9.1 8.6* 8.8*  --  8.4*  MG  --   --  1.6*  --   --   --    Liver Function Tests: No results for input(s): "AST", "ALT", "ALKPHOS", "BILITOT", "PROT", "ALBUMIN" in the last 168 hours. No results for input(s): "LIPASE", "AMYLASE" in the last 168 hours. No results for input(s): "AMMONIA" in the last 168 hours. CBC: Recent Labs  Lab 05/18/23 0236 05/19/23 0248 05/19/23 1808 05/20/23 0737 05/21/23  0202  WBC 18.1* 19.4* 21.3* 21.6* 16.8*  HGB 15.1* 14.8 15.9* 13.7 13.3  HCT 44.1 43.2 45.3 39.9 39.4  MCV 82.9 83.4 85.3 85.8 84.9  PLT 282 272 315 291 281   Cardiac Enzymes: No results for input(s): "CKTOTAL",  "CKMB", "CKMBINDEX", "TROPONINI" in the last 168 hours. BNP: Invalid input(s): "POCBNP" CBG: Recent Labs  Lab 05/20/23 1157 05/20/23 1650 05/20/23 2045 05/21/23 0757 05/21/23 1234  GLUCAP 262* 357* 188* 245* 297*   D-Dimer No results for input(s): "DDIMER" in the last 72 hours. Hgb A1c No results for input(s): "HGBA1C" in the last 72 hours. Lipid Profile No results for input(s): "CHOL", "HDL", "LDLCALC", "TRIG", "CHOLHDL", "LDLDIRECT" in the last 72 hours. Thyroid function studies No results for input(s): "TSH", "T4TOTAL", "T3FREE", "THYROIDAB" in the last 72 hours.  Invalid input(s): "FREET3" Anemia work up No results for input(s): "VITAMINB12", "FOLATE", "FERRITIN", "TIBC", "IRON", "RETICCTPCT" in the last 72 hours. Urinalysis No results found for: "COLORURINE", "APPEARANCEUR", "LABSPEC", "PHURINE", "GLUCOSEU", "HGBUR", "BILIRUBINUR", "KETONESUR", "PROTEINUR", "UROBILINOGEN", "NITRITE", "LEUKOCYTESUR" Sepsis Labs Recent Labs  Lab 05/19/23 0248 05/19/23 1808 05/20/23 0737 05/21/23 0202  WBC 19.4* 21.3* 21.6* 16.8*   Microbiology Recent Results (from the past 240 hour(s))  MRSA Next Gen by PCR, Nasal     Status: Abnormal   Collection Time: 05/16/23 12:28 AM   Specimen: Nasal Mucosa; Nasal Swab  Result Value Ref Range Status   MRSA by PCR Next Gen DETECTED (A) NOT DETECTED Final    Comment: RESULT CALLED TO, READ BACK BY AND VERIFIED WITH: A COOPER,RN@0231  05/16/23 MK (NOTE) The GeneXpert MRSA Assay (FDA approved for NASAL specimens only), is one component of a comprehensive MRSA colonization surveillance program. It is not intended to diagnose MRSA infection nor to guide or monitor treatment for MRSA infections. Test performance is not FDA approved in patients less than 18 years old. Performed at [facility], [address], [place], [place] [address]   Culture, blood (Routine X 2) w Reflex to ID Panel     Status: None (Preliminary result)   Collection  Time: 05/18/23 12:48 PM   Specimen: BLOOD LEFT HAND  Result Value Ref Range Status   Specimen Description BLOOD LEFT HAND  Final   Special Requests   Final    BOTTLES DRAWN AEROBIC AND ANAEROBIC Blood Culture adequate volume   Culture   Final    NO GROWTH 3 DAYS Performed at [facility], [address] [place], [place] [address]    Report Status PENDING  Incomplete  Culture, blood (Routine X 2) w Reflex to ID Panel     Status: None (Preliminary result)   Collection Time: 05/18/23 12:48 PM   Specimen: BLOOD RIGHT HAND  Result Value Ref Range Status   Specimen Description BLOOD RIGHT HAND  Final   Special Requests   Final    BOTTLES DRAWN AEROBIC AND ANAEROBIC Blood Culture results may not be optimal due to an excessive volume of blood received in culture bottles   Culture   Final    NO GROWTH 3 DAYS Performed at [facility], [address], [place], [place] [address]    Report Status PENDING  Incomplete  Aerobic Culture w Gram Stain (superficial specimen)     Status: None (Preliminary result)   Collection Time: 05/20/23  2:00 AM   Specimen: Wound  Result Value Ref Range Status   Specimen Description WOUND  Final   Special Requests FACE  Final   Gram  Stain NO WBC SEEN NO ORGANISMS SEEN   Final   Culture   Final    RARE STAPHYLOCOCCUS AUREUS SUSCEPTIBILITIES TO FOLLOW Performed at [facility], [address], [place], [place] [address]    Report Status PENDING  Incomplete    Please note: You were cared for by a hospitalist during your hospital stay. Once you are discharged, your primary care physician will handle any further medical issues. Please note that NO REFILLS for any discharge medications will be authorized once you are discharged, as it is imperative that you return to your primary care physician (or establish a relationship with a primary care physician if you do not have one) for your post hospital discharge needs so that they can reassess your  need for medications and monitor your lab values.    Time coordinating discharge: 40 minutes  SIGNED:   Burnadette Pop, MD  [facility] 05/21/2023, 5:49 PM Pager [phone]  If 7PM-7AM, please contact night-coverage [email or web address] Password [email or web address]

## 2023-05-21 NOTE — Telephone Encounter (Signed)
   Transition of Care Follow-up Phone Call Request    Patient Name: Thuy Routt Date of Birth: [birth year] Date of Encounter: 05/21/2023  Primary Care Provider:  Glenna Durand, PA-C Primary Cardiologist:  None  Elberta Spaniel West has been scheduled for a transition of care follow up appointment with a [facility] provider:  Carlos Levering 7/26  Please reach out to [facility] [place] within 48 hours to confirm appointment and review transition of care protocol questionnaire.  Laverda Page, NP  05/21/2023, 10:09 AM

## 2023-05-21 NOTE — ED Triage Notes (Signed)
The pt was in the hospital getting antibiotics for the infection in her lt face  she signed out ama and she reports that she is here to be admitted to get the ic antibiotics that was ordered

## 2023-05-21 NOTE — H&P (Signed)
History and Physical    Kathleen Marks ZOX:[id number] DOB: 1970/12/30 DOA: 05/21/2023  PCP: Glenna Durand, PA-C   Patient coming from: Home   Chief Complaint: Facial swelling   HPI: Kathleen Marks is a 52 y.o. female with medical history significant for type 2 diabetes mellitus, hyperlipidemia, asthma, CAD status post PCI to LAD and POBA to D2 on 05/19/2023, and facial cellulitis for which she was admitted to the hospital before leaving AMA earlier this evening and now returning less than an hour later after smoking a cigarette.   Patient has had 2 recent admissions complicated by noncompliance and leaving the hospital AMA.  She initially presented on 05/16/2023 with NSTEMI but left AMA prior to [facility].  She returned to the hospital 05/18/2023 for resumption of care but had also developed left lower facial swelling and pain in the interim, was found to have cellulitis, and started on vancomycin.  She underwent the LHC on 05/19/2023 with PCI of LAD and balloon angioplasty.  ENT was consulted regarding the facial cellulitis with developing fluid collection, but did not feel that there was any drainable abscess and recommended ongoing antibiotics and warm compress.  Patient denies any chest pain in the past couple days.  She does not want to use nicotine patch or nicotine gum while hospitalized.  ED Course: Upon arrival to the ED, patient is found to be afebrile and saturating well on room air with SBP 103 and greater.  WBC is 14,800 and creatinine 1.02.  Maxillofacial CT is notable for interval increase in nodular phlegmon in the left lower face with surrounding inflammatory changes.  Patient was given vancomycin, 1 liter NS, and Zofran in the ED.  Review of Systems:  All other systems reviewed and apart from HPI, are negative.  Past Medical History:  Diagnosis Date   Asthma    Diabetes mellitus without complication (HCC)    Hyperlipemia     Past Surgical History:   Procedure Laterality Date   ARTHROSCOPIC REPAIR ACL     FEMUR SURGERY      Social History:   reports that she has been smoking cigarettes. She does not have any smokeless tobacco history on file. She reports that she does not drink alcohol and does not use drugs.  Allergies  Allergen Reactions   Iodinated Contrast Media Shortness Of Breath and Anaphylaxis   Penicillins Hives   Bupropion Itching    Pt states she has psychological reaction to generic Wellbutrin   Clindamycin/Lincomycin Nausea And Vomiting   Tetanus Toxoids Itching and Other (See Comments)    Pain/knots in arms    Azithromycin Nausea And Vomiting and Nausea Only    States she can tolerate.    Blue Dyes (Parenteral) Rash    History reviewed. No pertinent family history.   Prior to Admission medications   Medication Sig Start Date End Date Taking? Authorizing Provider  acetaminophen (TYLENOL) 500 MG tablet Take 500-1,000 mg by mouth every 6 (six) hours as needed for moderate pain.    [provider]  albuterol (PROVENTIL HFA;VENTOLIN HFA) 108 (90 BASE) MCG/ACT inhaler Inhale 2 puffs into the lungs every 4 (four) hours as needed for wheezing or shortness of breath. 05/21/14   Piepenbrink, Victorino Dike, PA-C  aspirin EC 81 MG tablet Take 1 tablet (81 mg total) by mouth daily. Swallow whole. 05/22/23   Burnadette Pop, MD  atorvastatin (LIPITOR) 80 MG tablet Take 1 tablet (80 mg total) by mouth daily. 05/22/23   Burnadette Pop, MD  ibuprofen (ADVIL,MOTRIN) 200 MG tablet Take 800-1,200 mg by mouth 2 (two) times daily as needed for moderate pain (pain).     [provider]  metFORMIN (GLUCOPHAGE) 1000 MG tablet Take 1 tablet (1,000 mg total) by mouth 2 (two) times daily with a meal. 05/21/23   Burnadette Pop, MD  metoprolol tartrate (LOPRESSOR) 25 MG tablet Take 1 tablet (25 mg total) by mouth 2 (two) times daily. 05/21/23   Burnadette Pop, MD  sulfamethoxazole-trimethoprim (BACTRIM DS) 800-160 MG tablet Take 1  tablet by mouth 2 (two) times daily for 7 days. 05/21/23 05/28/23  Burnadette Pop, MD  ticagrelor (BRILINTA) 90 MG TABS tablet Take 1 tablet (90 mg total) by mouth 2 (two) times daily. 05/21/23   Burnadette Pop, MD    Physical Exam: Vitals:   05/21/23 1845 05/21/23 1852 05/21/23 2122  BP: 133/75  127/79  Pulse: (!) 106  85  Resp: 16  16  Temp: 98.3 F (36.8 C)    TempSrc: Oral    SpO2: 98%  100%  Weight:  88.3 kg   Height:  5\' 3"  (1.6 m)      Constitutional: NAD, no pallor or diaphoresis   Eyes: PERTLA, lids and conjunctivae normal ENMT: Mucous membranes are moist. Posterior pharynx clear of any exudate or lesions.   Neck: supple, no masses  Respiratory: no wheezing, no crackles. No accessory muscle use.  Cardiovascular: S1 & S2 heard, regular rate and rhythm. No extremity edema.  Abdomen: No distension, no tenderness, soft. Bowel sounds active.  Musculoskeletal: no clubbing / cyanosis. No joint deformity upper and lower extremities.   Skin: Swelling, erythema, heat and tenderness over left mandible where there is bloody and purulent drainage. Warm, dry, well-perfused. Neurologic: CN 2-12 grossly intact. Moving all extremities. Alert and oriented.  Psychiatric: Calm. Cooperative.    Labs and Imaging on Admission: I have personally reviewed following labs and imaging studies  CBC: Recent Labs  Lab 05/19/23 0248 05/19/23 1808 05/20/23 0737 05/21/23 0202 05/21/23 1948 05/21/23 2001  WBC 19.4* 21.3* 21.6* 16.8* 14.0*  --   NEUTROABS  --   --   --   --  9.8*  --   HGB 14.8 15.9* 13.7 13.3 14.3 13.9  HCT 43.2 45.3 39.9 39.4 41.7 41.0  MCV 83.4 85.3 85.8 84.9 84.2  --   PLT 272 315 291 281 315  --    Basic Metabolic Panel: Recent Labs  Lab 05/17/23 1954 05/18/23 0236 05/19/23 0248 05/19/23 1808 05/20/23 0737 05/21/23 1948 05/21/23 2001  NA 134* 132* 133*  --  133* 134* 135  K 3.7 3.5 4.5  --  3.6 3.9 4.1  CL 97* 101 102  --  101 100 102  CO2 21* 20* 20*  --  20*  23  --   GLUCOSE 384* 300* 358*  --  317* 267* 270*  BUN 16 10 14   --  18 19 21*  CREATININE 0.87 0.69 0.70 0.81 0.82 1.02* 0.90  CALCIUM 9.1 8.6* 8.8*  --  8.4* 8.9  --   MG  --  1.6*  --   --   --   --   --    GFR: Estimated Creatinine Clearance: 78.9 mL/min (by C-G formula based on SCr of 0.9 mg/dL). Liver Function Tests: Recent Labs  Lab 05/21/23 1948  AST 20  ALT 33  ALKPHOS 83  BILITOT 0.4  PROT 6.6  ALBUMIN 3.0*   No results for input(s): "LIPASE", "AMYLASE" in the last  168 hours. No results for input(s): "AMMONIA" in the last 168 hours. Coagulation Profile: No results for input(s): "INR", "PROTIME" in the last 168 hours. Cardiac Enzymes: No results for input(s): "CKTOTAL", "CKMB", "CKMBINDEX", "TROPONINI" in the last 168 hours. BNP (last 3 results) No results for input(s): "PROBNP" in the last 8760 hours. HbA1C: No results for input(s): "HGBA1C" in the last 72 hours. CBG: Recent Labs  Lab 05/20/23 1157 05/20/23 1650 05/20/23 2045 05/21/23 0757 05/21/23 1234  GLUCAP 262* 357* 188* 245* 297*   Lipid Profile: No results for input(s): "CHOL", "HDL", "LDLCALC", "TRIG", "CHOLHDL", "LDLDIRECT" in the last 72 hours. Thyroid Function Tests: No results for input(s): "TSH", "T4TOTAL", "FREET4", "T3FREE", "THYROIDAB" in the last 72 hours. Anemia Panel: No results for input(s): "VITAMINB12", "FOLATE", "FERRITIN", "TIBC", "IRON", "RETICCTPCT" in the last 72 hours. Urine analysis: No results found for: "COLORURINE", "APPEARANCEUR", "LABSPEC", "PHURINE", "GLUCOSEU", "HGBUR", "BILIRUBINUR", "KETONESUR", "PROTEINUR", "UROBILINOGEN", "NITRITE", "LEUKOCYTESUR" Sepsis Labs: @LABRCNTIP (procalcitonin:4,lacticidven:4) ) Recent Results (from the past 240 hour(s))  MRSA Next Gen by PCR, Nasal     Status: Abnormal   Collection Time: 05/16/23 12:28 AM   Specimen: Nasal Mucosa; Nasal Swab  Result Value Ref Range Status   MRSA by PCR Next Gen DETECTED (A) NOT DETECTED Final     Comment: RESULT CALLED TO, READ BACK BY AND VERIFIED WITH: A COOPER,RN@0231  05/16/23 MK (NOTE) The GeneXpert MRSA Assay (FDA approved for NASAL specimens only), is one component of a comprehensive MRSA colonization surveillance program. It is not intended to diagnose MRSA infection nor to guide or monitor treatment for MRSA infections. Test performance is not FDA approved in patients less than 40 years old. Performed at [facility], [address] [place], [place] [address]   Culture, blood (Routine X 2) w Reflex to ID Panel     Status: None (Preliminary result)   Collection Time: 05/18/23 12:48 PM   Specimen: BLOOD LEFT HAND  Result Value Ref Range Status   Specimen Description BLOOD LEFT HAND  Final   Special Requests   Final    BOTTLES DRAWN AEROBIC AND ANAEROBIC Blood Culture adequate volume   Culture   Final    NO GROWTH 3 DAYS Performed at [facility], [address], [place], [place] [address]    Report Status PENDING  Incomplete  Culture, blood (Routine X 2) w Reflex to ID Panel     Status: None (Preliminary result)   Collection Time: 05/18/23 12:48 PM   Specimen: BLOOD RIGHT HAND  Result Value Ref Range Status   Specimen Description BLOOD RIGHT HAND  Final   Special Requests   Final    BOTTLES DRAWN AEROBIC AND ANAEROBIC Blood Culture results may not be optimal due to an excessive volume of blood received in culture bottles   Culture   Final    NO GROWTH 3 DAYS Performed at [facility], [address], [place], [place] [address]    Report Status PENDING  Incomplete  Aerobic Culture w Gram Stain (superficial specimen)     Status: None (Preliminary result)   Collection Time: 05/20/23  2:00 AM   Specimen: Wound  Result Value Ref Range Status   Specimen Description WOUND  Final   Special Requests FACE  Final   Gram Stain NO WBC SEEN NO ORGANISMS SEEN   Final   Culture   Final    RARE STAPHYLOCOCCUS AUREUS SUSCEPTIBILITIES TO FOLLOW Performed  at [facility], [address], [place], [place]  [address]    Report Status PENDING  Incomplete     Radiological Exams on Admission: CT Soft Tissue Neck Wo Contrast  Result Date: 05/21/2023 CLINICAL DATA:  Left neck swelling, rule out abscess, contrast allergy EXAM: CT NECK WITHOUT CONTRAST TECHNIQUE: Multidetector CT imaging of the neck was performed following the standard protocol without intravenous contrast. RADIATION DOSE REDUCTION: This exam was performed according to the departmental dose-optimization program which includes automated exposure control, adjustment of the mA and/or kV according to patient size and/or use of iterative reconstruction technique. COMPARISON:  05/19/2023 CT maxillofacial with contrast FINDINGS: Interval increase in nodular material in the left lower face and neck soft tissues (series 3, images 45-61 and series 5, images 90-101), compared to 05/19/2023. Again noted is surrounding fat stranding and associated skin thickening, with inflammatory thickening in the left platysma and mild edema in the left submandibular and masticator space. No soft tissue gas. Evaluation for a peripherally enhancing collection is limited by the absence of intravenous contrast. Pharynx and larynx: Normal. No mass or swelling. Salivary glands: No inflammation, mass, or stone. Thyroid: Normal. Lymph nodes: Multiple prominent left level 1B and 2 A lymph nodes. No abnormal density lymph nodes. Vascular: Normal noncontrast appearance of the vasculature. Limited intracranial: Negative. Visualized orbits: Negative. Mastoids and visualized paranasal sinuses: Fluid in the right mastoid air cells. Clear paranasal sinuses. Skeleton: No acute osseous abnormality. Degenerative changes in the cervical spine. Again show list. Upper chest: No focal pulmonary opacity or pleural effusion. IMPRESSION: 1. Interval increase in nodular phlegmon in the left lower face and neck soft tissues, with surrounding  inflammatory changes, consistent with cellulitis. Evaluation for abscess is limited by the absence of intravenous contrast. No soft tissue gas. 2. Multiple prominent left level 1B and 2 A lymph nodes, likely reactive. Electronically Signed   By: Wiliam Ke M.D.   On: 05/21/2023 22:21    Assessment/Plan   1. Facial cellulitis  - Appears worse and pain increased but she is afebrile and WBC is downtrending  - There is spontaneous drainage  - Continue vancomycin and warm compress, add gram-negative coverage given worsening appearance despite vancomycin    2. CAD - No anginal symptoms  - Continue ASA, Brilinta, Lipitor, metoprolol   3. Chronic systolic CHF  - Appears euvolemic  - Continue beta-blocker, monitor volume status  4. Type II DM  - A1c was 11.2% this month  - Check CBGs and use SSI for now    5. Asthma  - Not in exacerbation, continue as-needed albuterol     DVT prophylaxis: Lovenox  Code Status: Full  Level of Care: Level of care: Telemetry Medical Family Communication: none present  Disposition Plan:  Patient is from: Home  Anticipated d/c is to: Home Anticipated d/c date is: 7/16 or 05/24/23  Patient currently:  Pending improvement in cellulitis and transition to oral antibiotic, TOC consult for help obtaining medications  Consults called: None  Admission status: Inpatient     Briscoe Deutscher, MD [facility]  05/21/2023, 11:19 PM

## 2023-05-21 NOTE — ED Provider Notes (Signed)
[place] EMERGENCY DEPARTMENT AT [facility] Provider Note   CSN: [id number] Arrival date & time: 05/21/23  1840     History  Chief Complaint  Patient presents with   Facial Pain    Kathleen Marks is a 52 y.o. female recent stent placement on Brilinta, left facial cellulitis here presenting with facial pain.  Patient was just in the hospital and signed out AGAINST MEDICAL ADVICE earlier today.  Patient had a stent placed and also developed left facial cellulitis.  Patient had ultrasound that showed phlegmon and had a CT scan that did not show any abscess.  Patient has been on IV vancomycin.  Patient was supposed to be discharged on Zyvox tomorrow.  Patient has seen multiple ENT but no I&D was performed.  Patient states that she has some social issues.  She states that she has no money and may be homeless in several weeks.  She states that she cannot afford Brilinta  The history is provided by the patient.       Home Medications Prior to Admission medications   Medication Sig Start Date End Date Taking? Authorizing Provider  acetaminophen (TYLENOL) 500 MG tablet Take 500-1,000 mg by mouth every 6 (six) hours as needed for moderate pain.    [provider]  albuterol (PROVENTIL HFA;VENTOLIN HFA) 108 (90 BASE) MCG/ACT inhaler Inhale 2 puffs into the lungs every 4 (four) hours as needed for wheezing or shortness of breath. 05/21/14   Piepenbrink, Victorino Dike, PA-C  aspirin EC 81 MG tablet Take 1 tablet (81 mg total) by mouth daily. Swallow whole. 05/22/23   Burnadette Pop, MD  atorvastatin (LIPITOR) 80 MG tablet Take 1 tablet (80 mg total) by mouth daily. 05/22/23   Burnadette Pop, MD  ibuprofen (ADVIL,MOTRIN) 200 MG tablet Take 800-1,200 mg by mouth 2 (two) times daily as needed for moderate pain (pain).     [provider]  metFORMIN (GLUCOPHAGE) 1000 MG tablet Take 1 tablet (1,000 mg total) by mouth 2 (two) times daily with a meal. 05/21/23   Burnadette Pop, MD  metoprolol tartrate (LOPRESSOR) 25 MG tablet Take 1 tablet (25 mg total) by mouth 2 (two) times daily. 05/21/23   Burnadette Pop, MD  sulfamethoxazole-trimethoprim (BACTRIM DS) 800-160 MG tablet Take 1 tablet by mouth 2 (two) times daily for 7 days. 05/21/23 05/28/23  Burnadette Pop, MD  ticagrelor (BRILINTA) 90 MG TABS tablet Take 1 tablet (90 mg total) by mouth 2 (two) times daily. 05/21/23   Burnadette Pop, MD      Allergies    Iodinated contrast media, Penicillins, Bupropion, Clindamycin/lincomycin, Tetanus toxoids, Azithromycin, and Blue dyes (parenteral)    Review of Systems   Review of Systems  Physical Exam Updated Vital Signs BP 133/75   Pulse (!) 106   Temp 98.3 F (36.8 C) (Oral)   Resp 16   Ht 5\' 3"  (1.6 m)   Wt 88.3 kg   LMP 05/08/2023 (Approximate)   SpO2 98%   BMI 34.48 kg/m  Physical Exam Vitals and nursing note reviewed.  Constitutional:      Comments: Crying and tearful  HENT:     Head: Normocephalic.     Nose: Nose normal.     Mouth/Throat:     Mouth: Mucous membranes are moist.  Eyes:     Pupils: Pupils are equal, round, and reactive to light.  Neck:     Comments: Swelling of the left jaw and patient has some purulent drainage and fluctuance in  the left submandibular area Cardiovascular:     Rate and Rhythm: Normal rate.     Pulses: Normal pulses.     Heart sounds: Normal heart sounds.  Pulmonary:     Effort: Pulmonary effort is normal.     Breath sounds: Normal breath sounds.  Abdominal:     General: Abdomen is flat.     Palpations: Abdomen is soft.  Musculoskeletal:        General: Normal range of motion.  Skin:    General: Skin is warm.     Capillary Refill: Capillary refill takes less than 2 seconds.  Neurological:     General: No focal deficit present.     Mental Status: She is alert.  Psychiatric:        Mood and Affect: Mood normal.     ED Results / Procedures / Treatments   Labs (all labs ordered are listed, but only  abnormal results are displayed) Labs Reviewed  CBC WITH DIFFERENTIAL/PLATELET  COMPREHENSIVE METABOLIC PANEL    EKG None  Radiology No results found.  Procedures Procedures    Medications Ordered in ED Medications  sodium chloride 0.9 % bolus 1,000 mL (has no administration in time range)  HYDROmorphone (DILAUDID) injection 1 mg (has no administration in time range)  vancomycin (VANCOCIN) IVPB 1000 mg/200 mL premix (has no administration in time range)    ED Course/ Medical Decision Making/ A&P                             Medical Decision Making Kathleen Marks is a 52 y.o. female here presenting with left jaw pain and also social issues.  Patient was admitted to the hospital for similar symptoms and supposed to be discharged tomorrow on Zyvox.  However she has some insurance issues and unable to get Zyvox.  She also is unable to get her Brilinta and she just had a cardiac stent placed.  Clinically she may have an abscess that is draining already.  Plan to get CBC CMP and CT neck.  Will give pain medicine as well.  10:50 PM White blood cell count is 14.  CT showed no collectible abscess.  I discussed case with Dr. Pollyann Kennedy from ENT.  He states that she had a draining abscess and recommend warm compresses and continue antibiotics.  Discussed case with the hospitalist who will admit patient.  Problems Addressed: Facial cellulitis: acute illness or injury  Amount and/or Complexity of Data Reviewed Labs: ordered. Decision-making details documented in ED Course. Radiology: ordered and independent interpretation performed. Decision-making details documented in ED Course.  Risk Prescription drug management. Decision regarding hospitalization.    Final Clinical Impression(s) / ED Diagnoses Final diagnoses:  None    Rx / DC Orders ED Discharge Orders     None         Charlynne Pander, MD 05/21/23 2251

## 2023-05-21 NOTE — Significant Event (Addendum)
Patient told that she wanted to leave . I called her and talked to her multiple times trying to convince her to stay . She told that she wants to go out to have a cigarette which was not possible as per hospital protocol.She eventually left. I have sent the medications to her pharmacy. As per the conversation,this mrng,she strickly denies taking insulin after discharge,so metformin was prescribed.

## 2023-05-21 NOTE — ED Notes (Signed)
Applied warm compress to facial wound

## 2023-05-21 NOTE — Progress Notes (Signed)
Pharmacy Antibiotic Note  Kathleen Marks is a 52 y.o. female admitted on 05/17/2023 with facial cellulitis.  Pharmacy has been consulted for vancomycin dosing.   WBC trending down, afebrile, and renal function stable. ENT notes no drainable abscess.   Plan: Vancomycin 1250 mg IV q24h >>>Estimated AUC: 479 Trend WBC, temp, renal function  Plan is to transition to PO abx on 7/15 Defer vancomycin levels unless continued   Height: 5\' 3"  (160 cm) Weight: 88.3 kg (194 lb 11.2 oz) IBW/kg (Calculated) : 52.4  Temp (24hrs), Avg:98.4 F (36.9 C), Min:98 F (36.7 C), Max:98.8 F (37.1 C)  Recent Labs  Lab 05/17/23 1954 05/18/23 0236 05/19/23 0248 05/19/23 1808 05/20/23 0737 05/21/23 0202  WBC 18.8* 18.1* 19.4* 21.3* 21.6* 16.8*  CREATININE 0.87 0.69 0.70 0.81 0.82  --     Estimated Creatinine Clearance: 86.6 mL/min (by C-G formula based on SCr of 0.82 mg/dL).    Allergies  Allergen Reactions   Iodinated Contrast Media Shortness Of Breath and Anaphylaxis   Penicillins Hives   Bupropion Itching    Pt states she has psychological reaction to generic Wellbutrin   Clindamycin/Lincomycin Nausea And Vomiting   Tetanus Toxoids Itching and Other (See Comments)    Pain/knots in arms    Azithromycin Nausea And Vomiting and Nausea Only    States she can tolerate.    Blue Dyes (Parenteral) Rash    Thank you for involving pharmacy in this patient's care.  Loura Back, PharmD, BCPS Clinical Pharmacist Clinical phone for 05/21/2023 is [phone] 05/21/2023 1:14 PM

## 2023-05-21 NOTE — Progress Notes (Signed)
   Rounding Note    Patient Name: Kathleen Marks Date of Encounter: 05/21/2023  [facility] Cardiologist: None   Subjective   NAEO.  No complaints this AM.  Vital Signs    Vitals:   05/21/23 0400 05/21/23 0507 05/21/23 0756 05/21/23 0803  BP:  109/65 116/74   Pulse:  82    Resp:  18 17   Temp:  98.5 F (36.9 C) 98 F (36.7 C)   TempSrc:  Oral Oral   SpO2: 99% 95%  99%  Weight:      Height:       No intake or output data in the 24 hours ending 05/21/23 0957     05/18/2023    3:17 PM 05/17/2023    7:46 PM 05/16/2023   12:23 AM  Last 3 Weights  Weight (lbs) 194 lb 11.2 oz 200 lb 197 lb 5 oz  Weight (kg) 88.315 kg 90.719 kg 89.5 kg      Telemetry    Personally Reviewed  ECG     Personally Reviewed  Physical Exam    GEN: No acute distress.  Laying flat.  Cardiac: RRR, no murmurs, rubs, or gallops.  Respiratory: Clear to auscultation bilaterally. Psych: Normal affect   Assessment & Plan    #NSTEMI #CAD Doing well after PCI to the LAD and POBA to the D2 yesterday. Chest pain resolved.  EF 30-35%, will need repeat in outpatient setting Cont aspirin, ticagrelor, atorvastatin  #HFrEF In setting of severe CAD. E 30-35% On metoprolol tartrate Marginal BP preventing initiation of entresto at this time.  EF will need to be reassessed after 90 days. If still reduced, consider ICD for primary prevention  #DM Poorly controlled. HgA1c 11.2. Management per IM team.  Cardiology will sign off. Please call with questions/concerns.   Sheria Lang T. Lalla Brothers, MD, [facility], [facility] Cardiac Electrophysiology

## 2023-05-21 NOTE — ED Notes (Signed)
Requested ordered medication from pharmacy 

## 2023-05-21 NOTE — Progress Notes (Signed)
Pt wanting to leave AMA, refusing to sign AMA papers because "she is not leaving against medical advice because she was told she could leave today". Pt has no discharge orders. All PIV sites removed and MD made aware of situation. RN and MD attempted to reason with pt to stay through the night but pt was not willing to stay. Pt continued to refuse to sign AMA form before walking out.

## 2023-05-21 NOTE — Progress Notes (Signed)
PROGRESS NOTE  Kathleen Marks  ZOX:[id number] DOB: August 03, 1971 DOA: 05/17/2023 PCP: Glenna Durand, PA-C   Brief Narrative: Patient is a 52 year old female with history of poorly controlled diabetes, asthma, hyperlipidemia, coronary artery disease with PCI and stent placement, recent admission for suspected acute MI but left AMA before left heart cath presented back with complaint of left lower facial swelling, pain.  She was admitted here 3 days ago for chest pain, elevated troponin, echo showed EF of 35%, severe hypokinesis.  She was bradycardic but she left AMA to take care of her children. On presentation she was afebrile but blood pressure was elevated, EKG showed sinus tachycardia.  Chest x-ray did not show pneumonia.  Troponins were elevated, WC count of 18,000.  Patient was started on Rocephin for facial cellulitis and also started on IV heparin for NSTEMI.  Cardiology consulted.  Cardiac cath showed multivessel disease with 90% stenosis of mid LAD, 99% stenosis of second diagonal.  Status post PCI of mid LAD, balloon angioplasty.  Started on aspirin, Brilinta.  ENT was following her for left face cellulitis.  Plan for discharge home tomorrow on oral antibiotics  Assessment & Plan:  Principal Problem:   Facial cellulitis Active Problems:   CAD S/P percutaneous coronary angioplasty   Type 2 diabetes mellitus (HCC)   Non-ST elevation (NSTEMI) myocardial infarction (HCC)   Acute on chronic combined systolic and diastolic CHF (congestive heart failure) (HCC)   NSTEMI: Scenario as above.  Elevated troponin.  Started on  IV heparin,now stopped.  Cardiac cath showed multivessel disease with 90% stenosis of mid LAD, 99% stenosis of second diagonal.  Status post PCI of mid LAD, balloon angioplasty.  Started on aspirin, Brilinta. Plan for 12 months.   Denies any chest pain today. Very noncompliant patient.   Facial cellulitis/abscess: Currently afebrile. Ultrasound of the face showed  2.2 x 1.3 x 2.3 cm on anterior left jaw.  ENT consulted.CT maxillofacial with contrast which did not show any drainable abscess but showed nodular phlegmon.  Continue vancomycin for now.reconsultation for ENT done on 7/13.  Dr. Pollyann Kennedy did not recommend any intervention, no I&D needed.  Will consider discharging her on Zyvox tomorrow.  Leukocytosis improving  type 2 diabetes: Uncontrolled.  Recent  A1c of 11.2.  Continue current insulin regimen.  Monitor blood sugars.  Consulted  diabetic coordinator.  Patient denies that her diabetes is false.  She states she does not have any diabetes despite her hemoglobin A1c of more than 10.  She states the medications that the doctors provided developed the diabetes. She is not agreeable to take any diabetic medications.  But will try to convince her to take at least metformin on discharge   Hyperlipidemia: On statin   Hypomagnesemia: Supplemented and corrected   Hypertension: Continue current medication.  On metoprolol   Asthma: Currently not in exacerbation.  Continue bronchodilators   Obesity: BMI of 34.4       DVT prophylaxis:enoxaparin (LOVENOX) injection 40 mg Start: 05/20/23 0800IV heparin     Code Status: Full Code  Family Communication: None at bedside  Patient status:Inpatient  Patient is from :Home  Anticipated discharge WJ:XBJY  Estimated DC date:tomorrow   Consultants: Cardiology,ENT  Procedures:None  Antimicrobials:  Anti-infectives (From admission, onward)    Start     Dose/Rate Route Frequency Ordered Stop   05/18/23 2200  Vancomycin (VANCOCIN) 1,250 mg in sodium chloride 0.9 % 250 mL IVPB  Status:  Discontinued        1,250  mg 166.7 mL/hr over 90 Minutes Intravenous Every 24 hours 05/18/23 0311 05/18/23 0311   05/18/23 2200  vancomycin (VANCOREADY) IVPB 1250 mg/250 mL        1,250 mg 166.7 mL/hr over 90 Minutes Intravenous Every 24 hours 05/18/23 0311     05/18/23 0230  vancomycin (VANCOREADY) IVPB 1500 mg/300 mL         1,500 mg 150 mL/hr over 120 Minutes Intravenous Once 05/18/23 0221 05/18/23 0515   05/18/23 0215  vancomycin (VANCOCIN) IVPB 1000 mg/200 mL premix  Status:  Discontinued        1,000 mg 200 mL/hr over 60 Minutes Intravenous  Once 05/18/23 0214 05/18/23 0221   05/18/23 0100  cefTRIAXone (ROCEPHIN) 1 g in sodium chloride 0.9 % 100 mL IVPB        1 g 200 mL/hr over 30 Minutes Intravenous  Once 05/18/23 0057 05/18/23 0200       Subjective: Patient seen and examined at bedside today.  Hemodynamically stable.  Feels better than yesterday.  Pain on the left face has   improved.  Again discussed about the ENT recommendation for no need of I&D for her left face cellulitis.  Objective: Vitals:   05/21/23 0400 05/21/23 0507 05/21/23 0756 05/21/23 0803  BP:  109/65 116/74   Pulse:  82    Resp:  18 17   Temp:  98.5 F (36.9 C) 98 F (36.7 C)   TempSrc:  Oral Oral   SpO2: 99% 95%  99%  Weight:      Height:       No intake or output data in the 24 hours ending 05/21/23 1023  Filed Weights   05/17/23 1946 05/18/23 1517  Weight: 90.7 kg 88.3 kg    Examination:  General exam: Overall comfortable, not in distress,obese HEENT: PERRL.left face cellulitis Respiratory system:  no wheezes or crackles  Cardiovascular system: S1 & S2 heard, RRR.  Gastrointestinal system: Abdomen is nondistended, soft and nontender. Central nervous system: Alert and oriented Extremities: No edema, no clubbing ,no cyanosis Skin: No rashes, no ulcers,no icterus     Data Reviewed: I have personally reviewed following labs and imaging studies  CBC: Recent Labs  Lab 05/18/23 0236 05/19/23 0248 05/19/23 1808 05/20/23 0737 05/21/23 0202  WBC 18.1* 19.4* 21.3* 21.6* 16.8*  HGB 15.1* 14.8 15.9* 13.7 13.3  HCT 44.1 43.2 45.3 39.9 39.4  MCV 82.9 83.4 85.3 85.8 84.9  PLT 282 272 315 291 281   Basic Metabolic Panel: Recent Labs  Lab 05/16/23 0622 05/17/23 1954 05/18/23 0236 05/19/23 0248  05/19/23 1808 05/20/23 0737  NA 132* 134* 132* 133*  --  133*  K 4.7 3.7 3.5 4.5  --  3.6  CL 101 97* 101 102  --  101  CO2 20* 21* 20* 20*  --  20*  GLUCOSE 485* 384* 300* 358*  --  317*  BUN 11 16 10 14   --  18  CREATININE 0.78 0.87 0.69 0.70 0.81 0.82  CALCIUM 8.6* 9.1 8.6* 8.8*  --  8.4*  MG  --   --  1.6*  --   --   --      Recent Results (from the past 240 hour(s))  MRSA Next Gen by PCR, Nasal     Status: Abnormal   Collection Time: 05/16/23 12:28 AM   Specimen: Nasal Mucosa; Nasal Swab  Result Value Ref Range Status   MRSA by PCR Next Gen DETECTED (A) NOT DETECTED Final  Comment: RESULT CALLED TO, READ BACK BY AND VERIFIED WITH: A COOPER,RN@[id number]  05/16/23 MK (NOTE) The GeneXpert MRSA Assay (FDA approved for NASAL specimens only), is one component of a comprehensive MRSA colonization surveillance program. It is not intended to diagnose MRSA infection nor to guide or monitor treatment for MRSA infections. Test performance is not FDA approved in patients less than 39 years old. Performed at [facility], [address] [place], [place] [address]   Culture, blood (Routine X 2) w Reflex to ID Panel     Status: None (Preliminary result)   Collection Time: 05/18/23 12:48 PM   Specimen: BLOOD LEFT HAND  Result Value Ref Range Status   Specimen Description BLOOD LEFT HAND  Final   Special Requests   Final    BOTTLES DRAWN AEROBIC AND ANAEROBIC Blood Culture adequate volume   Culture   Final    NO GROWTH 3 DAYS Performed at [facility], [address], [place], [place] [address]    Report Status PENDING  Incomplete  Culture, blood (Routine X 2) w Reflex to ID Panel     Status: None (Preliminary result)   Collection Time: 05/18/23 12:48 PM   Specimen: BLOOD RIGHT HAND  Result Value Ref Range Status   Specimen Description BLOOD RIGHT HAND  Final   Special Requests   Final    BOTTLES DRAWN AEROBIC AND ANAEROBIC Blood Culture results may not be optimal due to  an excessive volume of blood received in culture bottles   Culture   Final    NO GROWTH 3 DAYS Performed at [facility], [address] [place], [place] [address]    Report Status PENDING  Incomplete  Aerobic Culture w Gram Stain (superficial specimen)     Status: None (Preliminary result)   Collection Time: 05/20/23  2:00 AM   Specimen: Wound  Result Value Ref Range Status   Specimen Description WOUND  Final   Special Requests FACE  Final   Gram Stain   Final    NO WBC SEEN NO ORGANISMS SEEN Performed at [facility], [address], [place], [place] [address]    Culture PENDING  Incomplete   Report Status PENDING  Incomplete     Radiology Studies: CARDIAC CATHETERIZATION  Result Date: 05/19/2023   2nd Diag lesion is 99% stenosed.   Mid LAD-1 lesion is 90% stenosed.   Mid LAD-2 lesion is 40% stenosed.   Dist LAD lesion is 40% stenosed.   2nd Mrg lesion is 20% stenosed.   Lat 2nd Mrg lesion is 90% stenosed.   Prox RCA lesion is 100% stenosed.   A drug-eluting stent was successfully placed using a SYNERGY [id number].   Balloon angioplasty was performed using a BALLN EMERGE MR [id number].   Post intervention, there is a 0% residual stenosis.   Post intervention, there is a 50% residual stenosis.   LV end diastolic pressure is normal.   Recommend uninterrupted dual antiplatelet therapy with Aspirin 81mg  daily and Ticagrelor 90mg  twice daily for a minimum of 12 months (ACS-Class I recommendation). 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 Subtotal second diagonal. Stents in OM2 are still patent. 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 Aggressive risk factor modification. Guideline directed medical therapy for LV  dysfunction.    Scheduled Meds:  aspirin EC  81 mg Oral Daily    atorvastatin  80 mg Oral Daily   enoxaparin (LOVENOX) injection  40 mg Subcutaneous Q24H   insulin aspart  0-20 Units Subcutaneous TID WC   insulin aspart  0-5 Units Subcutaneous QHS   insulin glargine-yfgn  30 Units Subcutaneous Daily   metoprolol tartrate  25 mg Oral BID   mometasone-formoterol  2 puff Inhalation BID   sodium chloride flush  3 mL Intravenous Q12H   sodium chloride flush  3 mL Intravenous Q12H   ticagrelor  90 mg Oral BID   Continuous Infusions:  sodium chloride     vancomycin 1,250 mg (05/20/23 2259)     LOS: 3 days   Burnadette Pop, MD [facility] P7/14/2024, 10:23 AM

## 2023-05-21 NOTE — Progress Notes (Signed)
Pharmacy Antibiotic Note  Kathleen Marks is a 52 y.o. female admitted on 05/21/2023 with cellulitis.  Pharmacy has been consulted for Vancomycin dosing.    Plan: Vancomycin 7500 mg IV q12h  Height: 5\' 3"  (160 cm) Weight: 88.3 kg (194 lb 10.7 oz) IBW/kg (Calculated) : 52.4  Temp (24hrs), Avg:98.3 F (36.8 C), Min:98 F (36.7 C), Max:98.5 F (36.9 C)  Recent Labs  Lab 05/19/23 0248 05/19/23 1808 05/20/23 0737 05/21/23 0202 05/21/23 1948 05/21/23 2001  WBC 19.4* 21.3* 21.6* 16.8* 14.0*  --   CREATININE 0.70 0.81 0.82  --  1.02* 0.90    Estimated Creatinine Clearance: 78.9 mL/min (by C-G formula based on SCr of 0.9 mg/dL).    Allergies  Allergen Reactions   Iodinated Contrast Media Shortness Of Breath and Anaphylaxis   Penicillins Hives   Bupropion Itching    Pt states she has psychological reaction to generic Wellbutrin   Clindamycin/Lincomycin Nausea And Vomiting   Tetanus Toxoids Itching and Other (See Comments)    Pain/knots in arms    Azithromycin Nausea And Vomiting and Nausea Only    States she can tolerate.    Blue Dyes (Parenteral) Rash    Geannie Risen, PharmD, BCPS

## 2023-05-22 ENCOUNTER — Other Ambulatory Visit (HOSPITAL_COMMUNITY): Payer: Self-pay

## 2023-05-22 ENCOUNTER — Encounter (HOSPITAL_COMMUNITY): Payer: Self-pay | Admitting: Cardiology

## 2023-05-22 DIAGNOSIS — J45909 Unspecified asthma, uncomplicated: Secondary | ICD-10-CM | POA: Diagnosis not present

## 2023-05-22 DIAGNOSIS — L03211 Cellulitis of face: Secondary | ICD-10-CM | POA: Diagnosis not present

## 2023-05-22 DIAGNOSIS — I2510 Atherosclerotic heart disease of native coronary artery without angina pectoris: Secondary | ICD-10-CM | POA: Diagnosis not present

## 2023-05-22 DIAGNOSIS — E1169 Type 2 diabetes mellitus with other specified complication: Secondary | ICD-10-CM | POA: Diagnosis not present

## 2023-05-22 LAB — BASIC METABOLIC PANEL
Anion gap: 7 (ref 5–15)
BUN: 14 mg/dL (ref 6–20)
CO2: 22 mmol/L (ref 22–32)
Calcium: 7.9 mg/dL — ABNORMAL LOW (ref 8.9–10.3)
Chloride: 104 mmol/L (ref 98–111)
Creatinine, Ser: 0.68 mg/dL (ref 0.44–1.00)
GFR, Estimated: >60 mL/min (ref >60)
Glucose, Bld: 254 mg/dL — ABNORMAL HIGH (ref 70–99)
Potassium: 3.6 mmol/L (ref 3.5–5.1)
Sodium: 133 mmol/L — ABNORMAL LOW (ref 135–145)

## 2023-05-22 LAB — URINALYSIS, ROUTINE W REFLEX MICROSCOPIC
Bilirubin Urine: NEGATIVE
Glucose, UA: 50 mg/dL — AB
Hgb urine dipstick: NEGATIVE
Ketones, ur: NEGATIVE mg/dL
Leukocytes,Ua: NEGATIVE
Nitrite: NEGATIVE
Protein, ur: NEGATIVE mg/dL
Specific Gravity, Urine: 1.016 (ref 1.005–1.030)
pH: 6 (ref 5.0–8.0)

## 2023-05-22 LAB — AEROBIC CULTURE W GRAM STAIN (SUPERFICIAL SPECIMEN): Gram Stain: NONE SEEN

## 2023-05-22 LAB — CBC
HCT: 39.6 % (ref 36.0–46.0)
Hemoglobin: 13.6 g/dL (ref 12.0–15.0)
MCH: 29.7 pg (ref 26.0–34.0)
MCHC: 34.3 g/dL (ref 30.0–36.0)
MCV: 86.5 fL (ref 80.0–100.0)
Platelets: 279 10*3/uL (ref 150–400)
RBC: 4.58 MIL/uL (ref 3.87–5.11)
RDW: 13.5 % (ref 11.5–15.5)
WBC: 14.6 10*3/uL — ABNORMAL HIGH (ref 4.0–10.5)
nRBC: 0 % (ref 0.0–0.2)

## 2023-05-22 LAB — CBG MONITORING, ED
Glucose-Capillary: 214 mg/dL — ABNORMAL HIGH (ref 70–99)
Glucose-Capillary: 222 mg/dL — ABNORMAL HIGH (ref 70–99)
Glucose-Capillary: 236 mg/dL — ABNORMAL HIGH (ref 70–99)

## 2023-05-22 LAB — CULTURE, BLOOD (ROUTINE X 2)

## 2023-05-22 LAB — MAGNESIUM: Magnesium: 1.5 mg/dL — ABNORMAL LOW (ref 1.7–2.4)

## 2023-05-22 MED ORDER — IBUPROFEN 200 MG PO TABS
200.0000 mg | ORAL_TABLET | Freq: Two times a day (BID) | ORAL | 0 refills | Status: AC | PRN
  Filled 2023-05-22: qty 30, 15d supply, fill #0

## 2023-05-22 MED ORDER — TICAGRELOR 90 MG PO TABS
90.0000 mg | ORAL_TABLET | Freq: Two times a day (BID) | ORAL | 1 refills | Status: DC
  Filled 2023-05-22: qty 60, 30d supply, fill #0

## 2023-05-22 MED ORDER — METFORMIN HCL 1000 MG PO TABS
1000.0000 mg | ORAL_TABLET | Freq: Two times a day (BID) | ORAL | 1 refills | Status: DC
  Filled 2023-05-22: qty 60, 30d supply, fill #0

## 2023-05-22 MED ORDER — METOPROLOL TARTRATE 25 MG PO TABS
25.0000 mg | ORAL_TABLET | Freq: Two times a day (BID) | ORAL | 1 refills | Status: DC
  Filled 2023-05-22: qty 60, 30d supply, fill #0

## 2023-05-22 MED ORDER — MAGNESIUM OXIDE -MG SUPPLEMENT 400 (240 MG) MG PO TABS
400.0000 mg | ORAL_TABLET | Freq: Two times a day (BID) | ORAL | Status: DC
  Administered 2023-05-22: 400 mg via ORAL
  Filled 2023-05-22: qty 1

## 2023-05-22 MED ORDER — ALBUTEROL SULFATE HFA 108 (90 BASE) MCG/ACT IN AERS: 2.0000 | INHALATION_SPRAY | RESPIRATORY_TRACT | 1 refills | Status: AC | PRN

## 2023-05-22 MED ORDER — ATORVASTATIN CALCIUM 80 MG PO TABS
80.0000 mg | ORAL_TABLET | Freq: Every day | ORAL | 1 refills | Status: DC
  Filled 2023-05-22: qty 30, 30d supply, fill #0

## 2023-05-22 MED ORDER — SULFAMETHOXAZOLE-TRIMETHOPRIM 800-160 MG PO TABS
1.0000 | ORAL_TABLET | Freq: Two times a day (BID) | ORAL | 0 refills | Status: AC
  Filled 2023-05-22: qty 14, 7d supply, fill #0

## 2023-05-22 MED ORDER — ASPIRIN 81 MG PO TBEC
81.0000 mg | DELAYED_RELEASE_TABLET | Freq: Every day | ORAL | 1 refills | Status: AC
  Filled 2023-05-22: qty 120, 120d supply, fill #0

## 2023-05-22 MED FILL — Verapamil HCl IV Soln 2.5 MG/ML: INTRAVENOUS | Qty: 2 | Status: AC

## 2023-05-22 NOTE — Care Management Obs Status (Signed)
MEDICARE OBSERVATION STATUS NOTIFICATION   Patient Details  Name: Kathleen Marks MRN: [id number] Date of Birth: 1971-02-13   Medicare Observation Status Notification Given:  Yes    Oletta Cohn, RN 05/22/2023, 11:57 AM

## 2023-05-22 NOTE — Telephone Encounter (Signed)
As of 05/22/23, patient is still admitted to [facility].

## 2023-05-22 NOTE — Discharge Summary (Signed)
Physician Discharge Summary  [name] WUJ:[id number] DOB: January 24, 1971 DOA: 05/21/2023  PCP: Glenna Durand, PA-C  Admit date: 05/21/2023 Discharge date: 05/22/2023  Admitted From: Home  Discharge disposition: Home    Recommendations for Outpatient Follow-Up:   Follow up with your primary care provider in one week.  Check CBC, BMP, magnesium in the next visit Follow-up with cardiology as outpatient- as has been scheduled. Patient does have facial cellulitis and will need follow-up resolution..  She needs to be adequately treated for diabetes as an outpatient and will need ongoing education.   Discharge Diagnosis:   Principal Problem:   Facial cellulitis Active Problems:   CAD S/P percutaneous coronary angioplasty   Asthma, chronic   Type 2 diabetes mellitus (HCC)   Chronic systolic CHF (congestive heart failure) (HCC)   Discharge Condition: Improved.  Diet recommendation: Low sodium, heart healthy.  Carbohydrate-modified.  Wound care: None.  Code status: Full.   History of Present Illness:   Kathleen Marks is a 52 y.o. female with medical history significant for type 2 diabetes mellitus, hyperlipidemia, asthma, CAD status post PCI to LAD and POBA to D2 on 05/19/2023, was admitted to the hospital for facial cellulitis but had decided to leave AMA but then returned within an hour to the hospital.  Patient with history of recurrent admissions and noncompliance in the past.   She initially presented on 05/16/2023 with NSTEMI but left AMA prior to [facility].  She returned to the hospital 05/18/2023 for resumption of care but had also developed left lower facial swelling and pain in the interim, was found to have cellulitis, and started on vancomycin.  She underwent the LHC on 05/19/2023 with PCI of LAD and balloon angioplasty. ENT was consulted regarding the facial cellulitis with developing fluid collection, but did not feel that there was any drainable abscess and  recommended ongoing antibiotics and warm compress. In the  ED, patient was found to be afebrile and saturating well on room air with SBP 103 and greater.  WBC is 14,800 and creatinine 1.02.  Maxillofacial CT was notable for interval increase in nodular phlegmon in the left lower face with surrounding inflammatory changes. Patient was given vancomycin, 1 liter NS, and Zofran in the ED.   Hospital Course:   Following conditions were addressed during hospitalization as listed below,  Left facial cellulitis   Afebrile with downtrending WBC.  Spontaneous drainage noted.  Patient has been seen by ENT during recent hospitalization who recommended conservative treatment.  Was initially admitted with vancomycin and Rocephin.  At this time will change to Bactrim on discharge.  Patient left AGAINST MEDICAL ADVICE yesterday.  Encouraged to complete the course of antibiotic.  Will prescribe meds to [facility] pharmacy.   CAD s/p PCI 05/2023 Will send prescription to [facility] pharmacy, continue ASA, Brilinta, Lipitor, metoprolol.  Will need follow-up with cardiology as outpatient.  Patient was counseled regarding the importance of compliance with medication.   Chronic systolic CHF  Continue metoprolol.  Compensated.   Type II DM  Hemoglobin A1c was 11.2%, patient believes that it is high because of short course of prednisone she received recently.  Advised diabetic diet.  Unlikely to be compliant with regimen.    Asthma  - Not in exacerbation, continue as-needed albuterol prescription will be given.  Disposition.  At this time, patient is stable for disposition with outpatient PCP follow-up.  Medical Consultants:   None.  Procedures:    None Subjective:   Today, patient was  seen and examined at bedside.  Feels frustrated about her medical situation.  Had left AGAINST MEDICAL ADVICE yesterday and came back after smoking.  Does not believe that diabetes is bad.  Spoke to her about the importance of being on  medications since she recently had stent placed in.    Discharge Exam:   Vitals:   05/22/23 0856 05/22/23 1250  BP: 132/76   Pulse: 80   Resp: 18   Temp: 98.5 F (36.9 C) 98.3 F (36.8 C)  SpO2: 100%    Vitals:   05/22/23 0330 05/22/23 0450 05/22/23 0856 05/22/23 1250  BP: 126/70 131/77 132/76   Pulse: 82 87 80   Resp:  16 18   Temp:  98 F (36.7 C) 98.5 F (36.9 C) 98.3 F (36.8 C)  TempSrc:  Oral Oral Oral  SpO2: 99% 100% 100%   Weight:      Height:       Body mass index is 34.48 kg/m.   General: Alert awake, not in obvious distress, obese build.  Appears frustrated and irritable. HENT: pupils equally reacting to light,  No scleral pallor or icterus noted. Oral mucosa is moist.  Left facial area with induration and erythema. Chest:  Clear breath sounds.   No crackles or wheezes.  CVS: S1 &S2 heard. No murmur.  Regular rate and rhythm. Abdomen: Soft, nontender, nondistended.  Bowel sounds are heard.   Extremities: No cyanosis, clubbing or edema.  Peripheral pulses are palpable. Psych: Alert, awake and oriented, normal mood CNS:  No cranial nerve deficits.  Power equal in all extremities.   Skin: Warm and dry.  No rashes noted.  The results of significant diagnostics from this hospitalization (including imaging, microbiology, ancillary and laboratory) are listed below for reference.     Diagnostic Studies:   CT Soft Tissue Neck Wo Contrast  Result Date: 05/21/2023 CLINICAL DATA:  Left neck swelling, rule out abscess, contrast allergy EXAM: CT NECK WITHOUT CONTRAST TECHNIQUE: Multidetector CT imaging of the neck was performed following the standard protocol without intravenous contrast. RADIATION DOSE REDUCTION: This exam was performed according to the departmental dose-optimization program which includes automated exposure control, adjustment of the mA and/or kV according to patient size and/or use of iterative reconstruction technique. COMPARISON:  05/19/2023 CT  maxillofacial with contrast FINDINGS: Interval increase in nodular material in the left lower face and neck soft tissues (series 3, images 45-61 and series 5, images 90-101), compared to 05/19/2023. Again noted is surrounding fat stranding and associated skin thickening, with inflammatory thickening in the left platysma and mild edema in the left submandibular and masticator space. No soft tissue gas. Evaluation for a peripherally enhancing collection is limited by the absence of intravenous contrast. Pharynx and larynx: Normal. No mass or swelling. Salivary glands: No inflammation, mass, or stone. Thyroid: Normal. Lymph nodes: Multiple prominent left level 1B and 2 A lymph nodes. No abnormal density lymph nodes. Vascular: Normal noncontrast appearance of the vasculature. Limited intracranial: Negative. Visualized orbits: Negative. Mastoids and visualized paranasal sinuses: Fluid in the right mastoid air cells. Clear paranasal sinuses. Skeleton: No acute osseous abnormality. Degenerative changes in the cervical spine. Again show list. Upper chest: No focal pulmonary opacity or pleural effusion. IMPRESSION: 1. Interval increase in nodular phlegmon in the left lower face and neck soft tissues, with surrounding inflammatory changes, consistent with cellulitis. Evaluation for abscess is limited by the absence of intravenous contrast. No soft tissue gas. 2. Multiple prominent left level 1B and 2 A  lymph nodes, likely reactive. Electronically Signed   By: Wiliam Ke M.D.   On: 05/21/2023 22:21     Labs:   Basic Metabolic Panel: Recent Labs  Lab 05/18/23 0236 05/19/23 0248 05/19/23 1808 05/20/23 0737 05/21/23 1948 05/21/23 2001 05/22/23 0330  NA 132* 133*  --  133* 134* 135 133*  K 3.5 4.5  --  3.6 3.9 4.1 3.6  CL 101 102  --  101 100 102 104  CO2 20* 20*  --  20* 23  --  22  GLUCOSE 300* 358*  --  317* 267* 270* 254*  BUN 10 14  --  18 19 21* 14  CREATININE 0.69 0.70 0.81 0.82 1.02* 0.90 0.68   CALCIUM 8.6* 8.8*  --  8.4* 8.9  --  7.9*  MG 1.6*  --   --   --   --   --  1.5*   GFR Estimated Creatinine Clearance: 88.7 mL/min (by C-G formula based on SCr of 0.68 mg/dL). Liver Function Tests: Recent Labs  Lab 05/21/23 1948  AST 20  ALT 33  ALKPHOS 83  BILITOT 0.4  PROT 6.6  ALBUMIN 3.0*   No results for input(s): "LIPASE", "AMYLASE" in the last 168 hours. No results for input(s): "AMMONIA" in the last 168 hours. Coagulation profile No results for input(s): "INR", "PROTIME" in the last 168 hours.  CBC: Recent Labs  Lab 05/19/23 1808 05/20/23 0737 05/21/23 0202 05/21/23 1948 05/21/23 2001 05/22/23 0330  WBC 21.3* 21.6* 16.8* 14.0*  --  14.6*  NEUTROABS  --   --   --  9.8*  --   --   HGB 15.9* 13.7 13.3 14.3 13.9 13.6  HCT 45.3 39.9 39.4 41.7 41.0 39.6  MCV 85.3 85.8 84.9 84.2  --  86.5  PLT 315 291 281 315  --  279   Cardiac Enzymes: No results for input(s): "CKTOTAL", "CKMB", "CKMBINDEX", "TROPONINI" in the last 168 hours. BNP: Invalid input(s): "POCBNP" CBG: Recent Labs  Lab 05/21/23 0757 05/21/23 1234 05/22/23 0003 05/22/23 0753 05/22/23 1107  GLUCAP 245* 297* 214* 236* 222*   D-Dimer No results for input(s): "DDIMER" in the last 72 hours. Hgb A1c No results for input(s): "HGBA1C" in the last 72 hours. Lipid Profile No results for input(s): "CHOL", "HDL", "LDLCALC", "TRIG", "CHOLHDL", "LDLDIRECT" in the last 72 hours. Thyroid function studies No results for input(s): "TSH", "T4TOTAL", "T3FREE", "THYROIDAB" in the last 72 hours.  Invalid input(s): "FREET3" Anemia work up No results for input(s): "VITAMINB12", "FOLATE", "FERRITIN", "TIBC", "IRON", "RETICCTPCT" in the last 72 hours. Microbiology Recent Results (from the past 240 hour(s))  MRSA Next Gen by PCR, Nasal     Status: Abnormal   Collection Time: 05/16/23 12:28 AM   Specimen: Nasal Mucosa; Nasal Swab  Result Value Ref Range Status   MRSA by PCR Next Gen DETECTED (A) NOT DETECTED  Final    Comment: RESULT CALLED TO, READ BACK BY AND VERIFIED WITH: A COOPER,RN@[id number]  05/16/23 MK (NOTE) The GeneXpert MRSA Assay (FDA approved for NASAL specimens only), is one component of a comprehensive MRSA colonization surveillance program. It is not intended to diagnose MRSA infection nor to guide or monitor treatment for MRSA infections. Test performance is not FDA approved in patients less than 43 years old. Performed at [facility], [address], [place], [place] [address]   Culture, blood (Routine X 2) w Reflex to ID Panel     Status: None (Preliminary result)   Collection Time:  05/18/23 12:48 PM   Specimen: BLOOD LEFT HAND  Result Value Ref Range Status   Specimen Description BLOOD LEFT HAND  Final   Special Requests   Final    BOTTLES DRAWN AEROBIC AND ANAEROBIC Blood Culture adequate volume   Culture   Final    NO GROWTH 4 DAYS Performed at [facility], [address], [place], [place] [address]    Report Status PENDING  Incomplete  Culture, blood (Routine X 2) w Reflex to ID Panel     Status: None (Preliminary result)   Collection Time: 05/18/23 12:48 PM   Specimen: BLOOD RIGHT HAND  Result Value Ref Range Status   Specimen Description BLOOD RIGHT HAND  Final   Special Requests   Final    BOTTLES DRAWN AEROBIC AND ANAEROBIC Blood Culture results may not be optimal due to an excessive volume of blood received in culture bottles   Culture   Final    NO GROWTH 4 DAYS Performed at [facility], [address] [place], [place] [address]    Report Status PENDING  Incomplete  Aerobic Culture w Gram Stain (superficial specimen)     Status: None   Collection Time: 05/20/23  2:00 AM   Specimen: Wound  Result Value Ref Range Status   Specimen Description WOUND  Final   Special Requests FACE  Final   Gram Stain   Final    NO WBC SEEN NO ORGANISMS SEEN Performed at [facility], [address] [place], [place] [address]    Culture RARE  METHICILLIN RESISTANT STAPHYLOCOCCUS AUREUS  Final   Report Status 05/22/2023 FINAL  Final   Organism ID, Bacteria METHICILLIN RESISTANT STAPHYLOCOCCUS AUREUS  Final      Susceptibility   Methicillin resistant staphylococcus aureus - MIC*    CIPROFLOXACIN >=8 RESISTANT Resistant     ERYTHROMYCIN <=0.25 SENSITIVE Sensitive     GENTAMICIN <=0.5 SENSITIVE Sensitive     OXACILLIN >=4 RESISTANT Resistant     TETRACYCLINE <=1 SENSITIVE Sensitive     VANCOMYCIN <=0.5 SENSITIVE Sensitive     TRIMETH/SULFA >=320 RESISTANT Resistant     CLINDAMYCIN <=0.25 SENSITIVE Sensitive     RIFAMPIN <=0.5 SENSITIVE Sensitive     Inducible Clindamycin NEGATIVE Sensitive     LINEZOLID 2 SENSITIVE Sensitive     * RARE METHICILLIN RESISTANT STAPHYLOCOCCUS AUREUS     Discharge Instructions:   Discharge Instructions     Call MD for:  severe uncontrolled pain   Complete by: As directed    Call MD for:  temperature >100.4   Complete by: As directed    Diet Carb Modified   Complete by: As directed    Discharge instructions   Complete by: As directed    Follow-up with your primary care provider in 1 week.  Check blood work at that time.  Complete the course of antibiotic.  Please take all the medications for your heart problem.  Seek medical attention for worsening symptoms.  Take medication for diabetes diabetic control as outpatient.   Increase activity slowly   Complete by: As directed    No wound care   Complete by: As directed       Allergies as of 05/22/2023       Reactions   Iodinated Contrast Media Shortness Of Breath, Anaphylaxis   Penicillins Hives   Bupropion Itching   Pt states she has psychological reaction to generic Wellbutrin   Clindamycin/lincomycin Nausea And Vomiting  Tetanus Toxoids Itching, Other (See Comments)   Pain/knots in arms   Azithromycin Nausea And Vomiting, Nausea Only   States she can tolerate.    Blue Dyes (parenteral) Rash        Medication List     TAKE  these medications    acetaminophen 500 MG tablet Commonly known as: TYLENOL Take 500-1,000 mg by mouth every 6 (six) hours as needed for moderate pain.   albuterol 108 (90 Base) MCG/ACT inhaler Commonly known as: VENTOLIN HFA Inhale 2 puffs into the lungs every 4 (four) hours as needed for wheezing or shortness of breath.   aspirin EC 81 MG tablet Take 1 tablet (81 mg total) by mouth daily. Swallow whole.   atorvastatin 80 MG tablet Commonly known as: LIPITOR Take 1 tablet (80 mg total) by mouth daily.   ibuprofen 200 MG tablet Commonly known as: ADVIL Take 1 tablet (200 mg total) by mouth 2 (two) times daily as needed for moderate pain (pain). What changed: how much to take   metFORMIN 1000 MG tablet Commonly known as: GLUCOPHAGE Take 1 tablet (1,000 mg total) by mouth 2 (two) times daily with a meal.   metoprolol tartrate 25 MG tablet Commonly known as: LOPRESSOR Take 1 tablet (25 mg total) by mouth 2 (two) times daily.   sulfamethoxazole-trimethoprim 800-160 MG tablet Commonly known as: BACTRIM DS Take 1 tablet by mouth 2 (two) times daily for 7 days.   ticagrelor 90 MG Tabs tablet Commonly known as: BRILINTA Take 1 tablet (90 mg total) by mouth 2 (two) times daily.        Follow-up Information     Mady Haagensen A, PA-C Follow up in 1 week(s).   Specialty: Physician Assistant Contact information: [address] [place] [address] [phone]                  Time coordinating discharge: 39 minutes  Signed:  Hellena Pridgen  [facility] 05/22/2023, 1:52 PM

## 2023-05-22 NOTE — Care Management CC44 (Signed)
Condition Code 44 Documentation Completed  Patient Details  Name: Kathleen Marks MRN: [id number] Date of Birth: [birth year]   Condition Code 44 given:  Yes Patient signature on Condition Code 44 notice:  Yes Documentation of 2 MD's agreement:  Yes Code 44 added to claim:       Oletta Cohn, RN 05/22/2023, 11:57 AM

## 2023-05-23 LAB — CULTURE, BLOOD (ROUTINE X 2)
Culture: NO GROWTH
Culture: NO GROWTH
Special Requests: ADEQUATE

## 2023-05-23 LAB — LIPOPROTEIN A (LPA): Lipoprotein (a): 24.9 nmol/L (ref <75.0)

## 2023-05-23 NOTE — Telephone Encounter (Signed)
Patient currently in hospital.

## 2023-05-24 ENCOUNTER — Telehealth (HOSPITAL_COMMUNITY): Payer: Self-pay

## 2023-05-24 NOTE — Telephone Encounter (Signed)
 Left message for pt to call.

## 2023-05-24 NOTE — Telephone Encounter (Signed)
Per [facility] fax referral to [place]. 

## 2023-05-30 NOTE — Telephone Encounter (Signed)
Patient contacted regarding discharge from [facility] on 05/21/23.  Patient understands to follow up with provider D. Wittenborn on 06/15/23 at1:55pm at [employer] office Patient understands discharge instructions? Yes Patient understands medications and regiment? Yes Patient understands to bring all medications to this visit? Yes  Ask patient:  Are you enrolled in My Chart   Yes  If no ask patient if they would like to enroll.                  Please do not place any creams/ lotions/ or antibiotic ointment on any surgical incisions/ wounds without physician approval.              Do you have any questions about your medications? None   All medications (except pain medications) are to be filled by your Cardiologist Are you taking your pain medication? None              How is your pain controlled? Good              If you require a refill on pain medications, know that the same medication/ amount may not be prescribed or a refill may not be given.  Please contact your pharmacy for refill requests.               Do you have help at home with ADL's? None

## 2023-06-02 ENCOUNTER — Ambulatory Visit: Payer: Medicare HMO | Admitting: Student

## 2023-06-14 NOTE — Progress Notes (Deleted)
Cardiology Clinic Note   Date: 06/14/2023 ID: Sahej, Holling 16-Oct-1971, MRN [id number]  Primary Cardiologist:  None  Patient Profile    Kathleen Marks is a 52 y.o. female who presents to the clinic today for ***    Past medical history significant for: CAD. LHC 07/12/2018 (NSTEMI) performed at [facility]: LAD 20%.  Proximal OM1 80%.  CTO RCA.  OM 2 99%.  PTCA/DES 2.25 x 28 mm to proximal to mid OM2. LHC 07/13/2020 (STEMI) performed at [facility]: Mid LAD 30 to 40%.  Mid to distal LAD 20 to 30%.  D1 50%.  OM2 100% in-stent restenosis.  Mid RCA 80 to 90%.  PCI with DES 2.5 x 24 mm to OM 2. LHC 05/19/2023 (STEMI): D2 99%.  Mid LAD #1 90%, #2 40%.  Distal LAD 40%.  OM2 20%.  Lateral OM 290%.  Proximal RCA CTO.  Culprit lesion mid LAD.  PCI with DES to mid LAD, balloon angioplasty D2. Chronic systolic heart failure. Echo 05/16/2023: EF 30 to 35%.  RWMA.  Mild dyskinesis left ventricular, apical anteroseptal wall and anterior wall.  Severe hypokinesis left ventricular mid apical anteroseptal wall and anterior wall.  Grade I DD.  Normal RV function.  No significant valvular abnormalities. Hyperlipidemia. Lipid panel 05/16/2023: Direct LDL 165, HDL 32, TG 645, total 273. LPa 05/20/2023: 24.9. T2DM. A1c 05/16/2023: 11.2. Asthma. Tobacco abuse. Polysubstance abuse.     History of Present Illness    Kathleen Marks has a history of CAD followed by [facility] with PCI to OM 2 in the setting of NSTEMI September 2019 and PCI for in-stent restenosis OM 2 in September 2021 in the setting of STEMI.  She was first evaluated by Dr. Rennis Golden on 05/16/2023 for chest pain during hospital admission.  Patient presented to the ED on 05/15/2023 with complaints of left-sided chest pain radiating to her jaw with shortness of breath, nausea, lightheadedness, dizziness for 1 week.  Troponin 16>> 80.  Blood sugar > 300, triglycerides > 600.  She reported smoking 1 pack of cigarettes a day.  No  cocaine use for 2 years.  EKG initially thought to be A-fib in the ED.  Reviewed by Dr. Rennis Golden and appears to be sinus tachycardia or possible A. tach.  Additional troponin ordered.  Patient refusing insulin.  Echo showed moderate LV dysfunction (new from echo at [facility] in 2021) and significant LAD territory wall motion abnormality.  Repeat EKG showed 1 to 2 mm anterior ST elevation.  Patient expressed paranoia about medications and stents.  She refused heart catheterization and wished to leave AMA in order to take care of her son with MR.  Patient returned to the ED 05/17/2023 stating she had a "appointment for cardiac stent placement."  Reported continued intermittent chest discomfort described as a light grabbing sensation.  Patient also had facial swelling reporting that she had a pimple on her cheek that has grown in size.  Patient admitted to medicine for facial cellulitis.  Cardiology consulted regarding recent MI, heart failure, and ongoing chest pain.  Troponin 573 >> 620.  Patient underwent PCI with DES to mid LAD and balloon angioplasty to D2.  Patient was scheduled to be discharged on 05/22/2023.  However on the evening of 05/21/2023 she expressed that she wanted to sign out AMA.  Patient returned to the ED on 05/21/2023 to get IV antibiotics for the cellulitis of her face.  She was given vancomycin.  At the time of her  ED intake she expressed concern over being able to afford Brilinta.  She was discharged on 05/22/2023.  Today, patient ***  TOC?  CAD.  S/p PCI with DES to OM2 in the setting of NSTEMI September 2019, PCI with DES to OM 2 for in-stent restenosis September 2021 in the setting of STEMI, PCI with DES to mid LAD and balloon angioplasty to D2 in the setting of STEMI July 2024. Patient *** Continue aspirin, Brilinta, metoprolol, atorvastatin. Chronic systolic heart failure.  Echo July 2024 showed EF 30 to 35%, Grade I DD. Patient *** Euvolemic and well compensated on exam. Continue  *** Hyperlipidemia/hypertriglyceridemia.  LDL July 2024 165, TG 645.  Continue atorvastatin.  Will recheck lipid panel and LFTs in 2 to 3 months. T2DM.  A1c July 2024 11.2.  Patient*** Tobacco abuse.***  ROS: All other systems reviewed and are otherwise negative except as noted in History of Present Illness.  Studies Reviewed       ***  Risk Assessment/Calculations    {Does this patient have ATRIAL FIBRILLATION?:[phone]} No BP recorded.  {Refresh Note OR Click here to enter BP  :1}***        Physical Exam    VS:  LMP 05/08/2023 (Approximate)  , BMI There is no height or weight on file to calculate BMI.  GEN: Well nourished, well developed, in no acute distress. Neck: No JVD or carotid bruits. Cardiac: *** RRR. No murmurs. No rubs or gallops.   Respiratory:  Respirations regular and unlabored. Clear to auscultation without rales, wheezing or rhonchi. GI: Soft, nontender, nondistended. Extremities: Radials/DP/PT 2+ and equal bilaterally. No clubbing or cyanosis. No edema ***  Skin: Warm and dry, no rash. Neuro: Strength intact.  Assessment & Plan   ***  Disposition: ***     {Are you ordering a CV Procedure (e.g. stress test, cath, DCCV, TEE, etc)?   Press F2        :[id number]}   Signed, Etta Grandchild. , DNP, NP-C

## 2023-06-15 ENCOUNTER — Ambulatory Visit: Payer: Medicare HMO | Admitting: Student

## 2023-06-18 NOTE — Progress Notes (Unsigned)
Cardiology Clinic Note   Date: 06/20/2023 ID: Roise, Lofink Aug 16, 1971, MRN [id number]  Primary Cardiologist:  [name], MD  Patient Profile    Kathleen Marks is a 52 y.o. female who presents to the clinic today for hospital follow up.     Past medical history significant for: CAD. LHC 07/12/2018 (NSTEMI) performed at [facility]: LAD 20%.  Proximal OM1 80%.  CTO RCA.  OM 2 99%.  PTCA/DES 2.25 x 28 mm to proximal to mid OM2. LHC 07/13/2020 (STEMI) performed at [facility]: Mid LAD 30 to 40%.  Mid to distal LAD 20 to 30%.  D1 50%.  OM2 100% in-stent restenosis.  Mid RCA 80 to 90%.  PCI with DES 2.5 x 24 mm to OM 2. LHC 05/19/2023 (STEMI): D2 99%.  Mid LAD #1 90%, #2 40%.  Distal LAD 40%.  OM2 20%.  Lateral OM 290%.  Proximal RCA CTO.  Culprit lesion mid LAD.  PCI with DES to mid LAD, balloon angioplasty D2. Chronic systolic heart failure. Echo 05/16/2023: EF 30 to 35%.  RWMA.  Mild dyskinesis left ventricular, apical anteroseptal wall and anterior wall.  Severe hypokinesis left ventricular mid apical anteroseptal wall and anterior wall.  Grade I DD.  Normal RV function.  No significant valvular abnormalities. Hyperlipidemia. Lipid panel 05/16/2023: Direct LDL 165, HDL 32, TG 645, total 273. LPa 05/20/2023: 24.9. T2DM. A1c 05/16/2023: 11.2. Asthma. Tobacco abuse. Polysubstance abuse. In remission per patient.      History of Present Illness    Kathleen Marks has a history of CAD followed by [facility] with PCI to OM 2 in the setting of NSTEMI September 2019 and PCI for in-stent restenosis OM 2 in September 2021 in the setting of STEMI.  She was first evaluated by Dr. Rennis Golden on 05/16/2023 for chest pain during hospital admission.  Patient presented to the ED on 05/15/2023 with complaints of left-sided chest pain radiating to her jaw with shortness of breath, nausea, lightheadedness, dizziness for 1 week.  Troponin 16>> 80.  Blood sugar > 300, triglycerides >  600.  She reported smoking 1 pack of cigarettes a day.  No cocaine use for 2 years.  EKG initially thought to be A-fib in the ED.  Reviewed by Dr. Rennis Golden and appears to be sinus tachycardia or possible A. tach.  Additional troponin ordered.  Patient refused insulin.  Echo showed moderate LV dysfunction (new from echo at [facility] in 2021) and significant LAD territory wall motion abnormality.  Repeat EKG showed 1 to 2 mm anterior ST elevation.  Patient expressed concern about medications and stents.  She wished to defer heart catheterization and left AMA in order to take care of her son with MR.  Patient returned to the ED 05/17/2023 to undergo LHC.  Reported continued intermittent chest discomfort described as a light grabbing sensation.  Patient also had facial swelling reporting that she had a pimple on her cheek that has grown in size.  Patient admitted to medicine for facial cellulitis.  Cardiology consulted regarding recent MI, heart failure, and ongoing chest pain.  Troponin 573 >> 620.  Patient underwent PCI with DES to mid LAD and balloon angioplasty to D2.  Patient was scheduled to be discharged on 05/22/2023.  However on the evening of 05/21/2023 she expressed that she wanted to leave and signed out AMA.  Patient returned to the ED on 05/21/2023 to get IV antibiotics for the cellulitis of her face.  She was given  vancomycin.  At the time of her ED intake she expressed concern over being able to afford Brilinta.  She was discharged on 05/22/2023.   Today, patient is here alone. She reports continued episodes of "clenching" on the left side of her chest that lasts 30-60 minutes and resolves on its own. She also reports feeling "rippling" across her chest from the left side to the right side and down her body that is not necessarily a new sensation for her but seems to be happening more frequently. This typically occurs without exertion. She becomes very tearful and expresses her frustration over the treatment she  received in the hospital. 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 She felt as though nobody was listening to her or allowing her to advocate for herself. She reports she has a history of sensitivity to medications and is very concerned about past reactions to certain medications. She has a lot of stress in her life and is trying to get support, as she does not have friends or family close by. She was able to arrange for unlimited transportation through [place] which was a huge stressor for her. She continues to smoke 1 pack a day. She is interested in quitting. She would like to participate in cardiac rehab.        ROS: All other systems reviewed and are otherwise negative except as noted in History of Present Illness.  Studies Reviewed    EKG Interpretation Date/Time:  Tuesday June 20 2023 15:02:09 EDT Ventricular Rate:  89 PR Interval:  124 QRS Duration:  78 QT Interval:  354 QTC Calculation: 430 R Axis:   18  Text Interpretation: Normal sinus rhythm Low voltage QRS Poor R wave progression in anterior leads Possible Inferior infarct , age undetermined When compared with ECG of 20-May-2023 03:43, ST no longer depressed in Lateral leads T wave inversion no longer evident in Anterior leads Confirmed by Carlos Levering [phone]) on 06/20/2023 3:43:32 PM       Physical Exam    VS:  BP 104/66   Pulse 89   Ht 5\' 3"  (1.6 m)   Wt 194 lb 9.6 oz (88.3 kg)   LMP 05/26/2023 (Approximate)   SpO2 98%   BMI 34.47 kg/m  , BMI Body mass index is 34.47 kg/m.  GEN: Well nourished, well developed, in no acute distress. Neck: No JVD or carotid bruits. Cardiac:  RRR. No murmurs. No rubs or gallops.   Respiratory:  Respirations regular and unlabored. Clear to auscultation without rales, wheezing or rhonchi. GI: Soft, nontender,  nondistended. Extremities: Radials/DP/PT 2+ and equal bilaterally. No clubbing or cyanosis. No edema. Cath site without erythema, edema, drainage.   Skin: Warm and dry, no rash. Neuro: Strength intact.  Assessment & Plan    CAD.  S/p PCI with DES to OM2 in the setting of NSTEMI September 2019, PCI with DES to OM 2 for in-stent restenosis September 2021 in the setting of STEMI, PCI with DES to mid LAD and balloon angioplasty to D2 in the setting of STEMI July 2024. Patient reports clenching on left side of chest that lasts 30-60 minutes then resolves on its own. She also reports feeling "rippling" across her chest from the left side to the right side and down her body that is not necessarily a new sensation  for her but seems to be happening more frequently. This typically occurs without exertion. She endorses an increased amount of stress. She has not tried NTG for this discomfort. I believe there is a big anxiety component to her symptoms. Her EKG today shows NSR with poor R wave progression and resolution of T wave inversion in anterior leads and ST depression in lateral leads. She is instructed to try NTG if symptoms recur. She will contact the office if persistent symptoms. Would consider Ranexa vs. Imdur. Continue aspirin, Brilinta, metoprolol, atorvastatin, prn SL NTG. Chronic systolic heart failure.  Echo July 2024 showed EF 30 to 35%, Grade I DD. Patient denies lower extremity edema, DOE, orthopnea or PND. Euvolemic and well compensated on exam. Continue metoprolol. Patient is hesitant to take any more medications. Will defer additional medications until follow up.  Hyperlipidemia/hypertriglyceridemia.  LDL July 2024 165, TG 645.  Continue atorvastatin. She expresses concern with taking atorvastatin secondary to diabetes. She is very sensitive to medications. Will refer to pharm D lipid clinic to discuss options with patient.  T2DM.  A1c July 2024 11.2. Patient is hoping to change PCPs. Suggested  [facility], as it is close to her home.  Tobacco abuse. Patient continues to smoke 1 pack per day. She is interested in quitting. Discussed slowly decreasing cigarettes until complete cessation. She agrees to try when she is ready.   Disposition: Refer to pharm D lipid clinic. Return in 3 months or sooner as needed.          Signed, Etta Grandchild. , DNP, NP-C

## 2023-06-20 ENCOUNTER — Ambulatory Visit: Payer: Medicare HMO | Attending: Student | Admitting: Student

## 2023-06-20 ENCOUNTER — Encounter: Payer: Self-pay | Admitting: Student

## 2023-06-20 VITALS — BP 104/66 | HR 89 | Ht 63.0 in | Wt 194.6 lb

## 2023-06-20 DIAGNOSIS — E782 Mixed hyperlipidemia: Secondary | ICD-10-CM | POA: Diagnosis not present

## 2023-06-20 DIAGNOSIS — E1169 Type 2 diabetes mellitus with other specified complication: Secondary | ICD-10-CM | POA: Diagnosis not present

## 2023-06-20 DIAGNOSIS — I259 Chronic ischemic heart disease, unspecified: Secondary | ICD-10-CM

## 2023-06-20 DIAGNOSIS — I5022 Chronic systolic (congestive) heart failure: Secondary | ICD-10-CM

## 2023-06-20 DIAGNOSIS — Z720 Tobacco use: Secondary | ICD-10-CM

## 2023-06-20 DIAGNOSIS — I2510 Atherosclerotic heart disease of native coronary artery without angina pectoris: Secondary | ICD-10-CM

## 2023-06-20 NOTE — Patient Instructions (Signed)
Medication Instructions:  No medication changes *If you need a refill on your cardiac medications before your next appointment, please call your pharmacy*  Lab Work: No labs ordered If you have labs (blood work) drawn today and your tests are completely normal, you will receive your results only by: MyChart Message (if you have MyChart) OR A paper copy in the mail If you have any lab test that is abnormal or we need to change your treatment, we will call you to review the results.   Testing/Procedures: None   Follow-Up: At [facility], you and your health needs are our priority.  As part of our continuing mission to provide you with exceptional heart care, we have created designated Provider Care Teams.  These Care Teams include your primary Cardiologist (physician) and Advanced Practice Providers (APPs -  Physician Assistants and Nurse Practitioners) who all work together to provide you with the care you need, when you need it.  Your next appointment:   3 month(s)  Provider:   [name], MD  or APP

## 2023-06-27 ENCOUNTER — Ambulatory Visit: Payer: Medicare HMO | Admitting: Student

## 2023-07-05 ENCOUNTER — Ambulatory Visit: Payer: Medicare HMO | Attending: Cardiology | Admitting: Pharmacist Clinician (PhC)/ Clinical Pharmacy Specialist

## 2023-07-05 ENCOUNTER — Encounter: Payer: Self-pay | Admitting: Pharmacist Clinician (PhC)/ Clinical Pharmacy Specialist

## 2023-07-05 ENCOUNTER — Other Ambulatory Visit (HOSPITAL_BASED_OUTPATIENT_CLINIC_OR_DEPARTMENT_OTHER): Payer: Self-pay

## 2023-07-05 DIAGNOSIS — Z7984 Long term (current) use of oral hypoglycemic drugs: Secondary | ICD-10-CM | POA: Diagnosis not present

## 2023-07-05 DIAGNOSIS — E1169 Type 2 diabetes mellitus with other specified complication: Secondary | ICD-10-CM | POA: Diagnosis not present

## 2023-07-05 MED ORDER — METFORMIN HCL ER 500 MG PO TB24: 500.0000 mg | ORAL_TABLET | Freq: Two times a day (BID) | ORAL | 1 refills | Status: DC

## 2023-07-05 MED ORDER — METFORMIN HCL ER 500 MG PO TB24
500.0000 mg | ORAL_TABLET | Freq: Every day | ORAL | 3 refills | Status: DC
  Filled 2023-07-05: qty 90, 90d supply, fill #0

## 2023-07-05 NOTE — Patient Instructions (Signed)
Follow up appointment: with Edd Fabian in November  Go to the lab in 2 months - I will send you a MyChart message in about 6 weeks  Take your meds as follows:  Start metformin xr 500 mg once daily for 1 week.  Take with a meal.  If you tolerate this then increase the dose to twice daily.     Stop the atorvastatin for now.  We'll see what your labs look like in 2

## 2023-07-05 NOTE — Progress Notes (Unsigned)
Office Visit    Patient Name: Kathleen Marks Date of Encounter: 07/06/2023  Primary Care Provider:  Glenna Durand, PA-C Primary Cardiologist:  [name], MD  Chief Complaint    Hyperlipidemia - familial  Significant Past Medical History   ASCVD 2019 NSTEMI (PCI to OM2), 2021 STEMI (PCI - in-stent restenosis)  HFrEF In setting of severe CAD, EF 30-35%  DM2 7/24 - A1c 11.2 on metformin 1 gm bid  asthma Has albuterol MDI  Tobacco abuse 1 ppd     Allergies  Allergen Reactions   Iodinated Contrast Media Shortness Of Breath and Anaphylaxis   Penicillins Hives   Bupropion Itching    Pt states she has psychological reaction to generic Wellbutrin   Clindamycin/Lincomycin Nausea And Vomiting   Tetanus Toxoids Itching and Other (See Comments)    Pain/knots in arms    Azithromycin Nausea And Vomiting and Nausea Only    States she can tolerate.    Blue Dyes (Parenteral) Rash    History of Present Illness    Kathleen Marks is a 52 y.o. female patient of Dr Rennis Golden, in the office today to discuss options for cholesterol management.  This is upsetting to her, as she thought the appointment was to go through her medications and stop the ones she feels are not beneficial.  She states that she does not have normal reactions to medications, and blames much of her current health problems on the fact that she took Seroquel for many years.   Insurance Carrier: Medicare/Medicaid  LDL Cholesterol goal:  LDL < 55  Current Medications:   atorvastatin 80  Family Hx: paternal grandmother (multiple MI, stroke), maternal grandmother (DM, CHF), mother died at 71 (CKD, renal transplant), sister died in MVA (previously had gastric bypass, DM)  Social Hx: Tobacco: 1 ppd currently  Alcohol: rarely  Diet:  currently has no teeth, just got dentures a few days ago; still learning how to live with them; mostly prepared foods - pot pies and frozen dinners;  protein shakes once  daily for breakfast; day program for lunch; ice cream  few times per week;  likes vegetables, but limited by cost, inability to chew  Exercise: starts cardiac rehab next week in [place]   Accessory Clinical Findings   Lab Results  Component Value Date   CHOL 273 (H) 05/16/2023   HDL 32 (L) 05/16/2023   LDLCALC UNABLE TO CALCULATE IF TRIGLYCERIDE OVER 400 mg/dL 74/25/9563   LDLDIRECT 165 (H) 05/16/2023   TRIG 645 (H) 05/16/2023   CHOLHDL 8.5 05/16/2023    Lipoprotein (a)  Date/Time Value Ref Range Status  05/20/2023 02:20 AM 24.9 <75.0 nmol/L Final    Comment:    (NOTE) Note:  Values greater than or equal to 75.0 nmol/L may       indicate an independent risk factor for CHD,       but must be evaluated with caution when applied       to non-Caucasian populations due to the       influence of genetic factors on Lp(a) across       ethnicities. Performed At: [facility] [address] [place], [place] [id number] Jolene Schimke MD [id number]     Lab Results  Component Value Date   ALT 33 05/21/2023   AST 20 05/21/2023   ALKPHOS 83 05/21/2023   BILITOT 0.4 05/21/2023   Lab Results  Component Value Date   CREATININE 0.68 05/22/2023   BUN 14 05/22/2023  NA 133 (L) 05/22/2023   K 3.6 05/22/2023   CL 104 05/22/2023   CO2 22 05/22/2023   Lab Results  Component Value Date   HGBA1C 11.2 (H) 05/16/2023    Home Medications    Current Outpatient Medications  Medication Sig Dispense Refill   metFORMIN (GLUCOPHAGE-XR) 500 MG 24 hr tablet Take 1 tablet (500 mg total) by mouth daily with breakfast. 180 tablet 3   acetaminophen (TYLENOL) 500 MG tablet Take 500-1,000 mg by mouth every 6 (six) hours as needed for moderate pain.     albuterol (VENTOLIN HFA) 108 (90 Base) MCG/ACT inhaler Inhale 2 puffs into the lungs every 4 (four) hours as needed for wheezing or shortness of breath. 1 each 1   aspirin EC 81 MG tablet Take 1 tablet (81 mg total) by mouth daily. Swallow  whole. 120 tablet 1   atorvastatin (LIPITOR) 80 MG tablet Take 1 tablet (80 mg total) by mouth daily. 30 tablet 1   fluconazole (DIFLUCAN) 200 MG tablet Take 200 mg by mouth 2 (two) times daily. (Patient not taking: Reported on 07/05/2023)     ibuprofen (ADVIL) 200 MG tablet Take 1 tablet (200 mg total) by mouth 2 (two) times daily as needed for moderate pain (pain). 30 tablet 0   metoprolol tartrate (LOPRESSOR) 25 MG tablet Take 1 tablet (25 mg total) by mouth 2 (two) times daily. 60 tablet 1   ticagrelor (BRILINTA) 90 MG TABS tablet Take 1 tablet (90 mg total) by mouth 2 (two) times daily. 60 tablet 1   No current facility-administered medications for this visit.     Assessment & Plan    Type 2 diabetes mellitus (HCC) Patient with metabolic syndrome, believes all brought on by history of quetiapine use.  She is not interested in taking medications, would prefer to stop all and simply do lifestyle modifications.  She is scheduled to start cardiac rehab next week in [place].  Admits that she cannot exercise without someone to motivate her.  We reviewed all medications and I tried to explain the rationale for each.  She had previously stopped metformin because of vomiting.  I was able to convince her to re-challenge with metformin XR 500 mg.  She will take once daily for a week, then increase to twice daily.  While I know this will not get A1c down significantly, it is the only concession, as she refuses any injectable medications.   She assures me that once she can get rehab started and eat better, the DM will not be a problem.  I assured her that should her A1c drop to < 6.5 we could easily stop the metformin.    Hyperlipidemia Patient is insistent that medications are causing more harm than good.  She will stop atorvastatin for now, as she believes the lifestyle changes will improve readings.  Will repeat labs in 2-3 months   Phillips Hay, PharmD CPP [facility] [address]  [place],  [place] [address] [phone]  07/06/2023, 2:45 PM

## 2023-07-06 ENCOUNTER — Other Ambulatory Visit: Payer: Self-pay

## 2023-07-06 NOTE — Assessment & Plan Note (Signed)
Patient with metabolic syndrome, believes all brought on by history of quetiapine use.  She is not interested in taking medications, would prefer to stop all and simply do lifestyle modifications.  She is scheduled to start cardiac rehab next week in [place].  Admits that she cannot exercise without someone to motivate her.  We reviewed all medications and I tried to explain the rationale for each.  She had previously stopped metformin because of vomiting.  I was able to convince her to re-challenge with metformin XR 500 mg.  She will take once daily for a week, then increase to twice daily.  While I know this will not get A1c down significantly, it is the only concession, as she refuses any injectable medications.   She assures me that once she can get rehab started and eat better, the DM will not be a problem.  I assured her that should her A1c drop to < 6.5 we could easily stop the metformin.

## 2023-07-06 NOTE — Assessment & Plan Note (Signed)
Patient is insistent that medications are causing more harm than good.  She will stop atorvastatin for now, as she believes the lifestyle changes will improve readings.  Will repeat labs in 2-3 months

## 2023-08-11 ENCOUNTER — Encounter (HOSPITAL_BASED_OUTPATIENT_CLINIC_OR_DEPARTMENT_OTHER): Payer: Self-pay

## 2023-08-11 ENCOUNTER — Emergency Department (HOSPITAL_BASED_OUTPATIENT_CLINIC_OR_DEPARTMENT_OTHER)
Admission: EM | Admit: 2023-08-11 | Discharge: 2023-08-11 | Disposition: A | Discharge status: Home / Self Care | Payer: Medicare HMO | Attending: Emergency Medicine | Admitting: Emergency Medicine

## 2023-08-11 ENCOUNTER — Other Ambulatory Visit: Payer: Self-pay

## 2023-08-11 DIAGNOSIS — R220 Localized swelling, mass and lump, head: Secondary | ICD-10-CM | POA: Diagnosis present

## 2023-08-11 DIAGNOSIS — J0100 Acute maxillary sinusitis, unspecified: Secondary | ICD-10-CM | POA: Diagnosis not present

## 2023-08-11 DIAGNOSIS — Z7982 Long term (current) use of aspirin: Secondary | ICD-10-CM | POA: Diagnosis not present

## 2023-08-11 MED ORDER — OXYCODONE HCL 5 MG PO TABS
5.0000 mg | ORAL_TABLET | Freq: Once | ORAL | Status: AC
  Administered 2023-08-11: 5 mg via ORAL
  Filled 2023-08-11: qty 1

## 2023-08-11 MED ORDER — DOXYCYCLINE HYCLATE 100 MG PO CAPS: 100.0000 mg | ORAL_CAPSULE | Freq: Two times a day (BID) | ORAL | 0 refills | Status: AC

## 2023-08-11 MED ORDER — DOXYCYCLINE HYCLATE 100 MG PO TABS
100.0000 mg | ORAL_TABLET | Freq: Once | ORAL | Status: AC
  Administered 2023-08-11: 100 mg via ORAL
  Filled 2023-08-11: qty 1

## 2023-08-11 MED ORDER — DOXYCYCLINE HYCLATE 100 MG PO CAPS: 100.0000 mg | ORAL_CAPSULE | Freq: Two times a day (BID) | ORAL | 0 refills | Status: DC

## 2023-08-11 MED ORDER — OXYCODONE-ACETAMINOPHEN 5-325 MG PO TABS: 1.0000 | ORAL_TABLET | Freq: Four times a day (QID) | ORAL | 0 refills | Status: DC | PRN

## 2023-08-11 NOTE — ED Provider Notes (Signed)
[facility] Provider Note   CSN: [id number] Arrival date & time: 08/11/23  1802     History  Chief Complaint  Patient presents with   Facial Swelling    Kathleen Marks is a 52 y.o. female.  52 y.o. female presenting with facial swelling progressively worsening for 4 days.  Patient reports pain initially began in her right nostril and felt like a pimple on the inside of her nose.  It quickly grew in size and became more painful and spread to cause swelling on the right side of her face under her eye with pain radiating to her forehead and her jaw on the right side.  She denies fever and chills.  She has had something similar in the past but never to this extent.  She denies vision changes.  She has been taking Tylenol and ibuprofen the last 48 hours to help manage her pain. She has not been on any antibiotics recently and denies recent viral infections. She reports helping to clean out a very dirty room at church 2 days ago and did not wear a mask.   The history is provided by the patient. No language interpreter was used.       Home Medications Prior to Admission medications   Medication Sig Start Date End Date Taking? Authorizing Provider  doxycycline (VIBRAMYCIN) 100 MG capsule Take 1 capsule (100 mg total) by mouth 2 (two) times daily for 7 days. 08/11/23 08/18/23 Yes Glendale Chard, DO  oxyCODONE-acetaminophen (PERCOCET/ROXICET) 5-325 MG tablet Take 1 tablet by mouth every 6 (six) hours as needed for severe pain. 08/11/23  Yes Glendale Chard, DO  acetaminophen (TYLENOL) 500 MG tablet Take 500-1,000 mg by mouth every 6 (six) hours as needed for moderate pain.    [provider]  albuterol (VENTOLIN HFA) 108 (90 Base) MCG/ACT inhaler Inhale 2 puffs into the lungs every 4 (four) hours as needed for wheezing or shortness of breath. 05/22/23   Pokhrel, Rebekah Chesterfield, MD  aspirin EC 81 MG tablet Take 1 tablet (81 mg total) by mouth  daily. Swallow whole. 05/22/23   Pokhrel, Rebekah Chesterfield, MD  atorvastatin (LIPITOR) 80 MG tablet Take 1 tablet (80 mg total) by mouth daily. 05/22/23   Pokhrel, Rebekah Chesterfield, MD  fluconazole (DIFLUCAN) 200 MG tablet Take 200 mg by mouth 2 (two) times daily. Patient not taking: Reported on 07/05/2023 06/06/23   [provider]  ibuprofen (ADVIL) 200 MG tablet Take 1 tablet (200 mg total) by mouth 2 (two) times daily as needed for moderate pain (pain). 05/22/23   Pokhrel, Rebekah Chesterfield, MD  metFORMIN (GLUCOPHAGE-XR) 500 MG 24 hr tablet Take 1 tablet (500 mg total) by mouth daily with breakfast. 07/05/23   Hilty, Lisette Abu, MD  metoprolol tartrate (LOPRESSOR) 25 MG tablet Take 1 tablet (25 mg total) by mouth 2 (two) times daily. 05/22/23   Pokhrel, Rebekah Chesterfield, MD  ticagrelor (BRILINTA) 90 MG TABS tablet Take 1 tablet (90 mg total) by mouth 2 (two) times daily. 05/22/23   Pokhrel, Rebekah Chesterfield, MD      Allergies    Iodinated contrast media, Penicillins, Bupropion, Clindamycin/lincomycin, Tetanus toxoids, Azithromycin, and Blue dyes (parenteral)    Review of Systems   Review of Systems  Constitutional:  Negative for activity change, appetite change, chills, fatigue and fever.  HENT:  Positive for congestion, ear pain, sinus pressure and sinus pain. Negative for hearing loss, nosebleeds and postnasal drip.   Respiratory:  Negative for cough, choking, shortness of  breath and wheezing.   Cardiovascular:  Negative for chest pain.  Gastrointestinal:  Negative for nausea and vomiting.  Neurological:  Negative for dizziness, seizures, light-headedness and headaches.    Physical Exam Updated Vital Signs BP 108/71 (BP Location: Right Arm)   Pulse (!) 108   Temp 98.5 F (36.9 C) (Oral)   Resp 18   Ht 5\' 3"  (1.6 m)   Wt 90.7 kg   SpO2 98%   BMI 35.43 kg/m  Physical Exam Constitutional:      General: She is not in acute distress.    Appearance: Normal appearance. She is normal weight. She is not ill-appearing.  HENT:      Head: Normocephalic.     Right Ear: Tympanic membrane, ear canal and external ear normal.     Left Ear: Tympanic membrane, ear canal and external ear normal.     Nose: Septal deviation, nasal tenderness, mucosal edema and congestion present.     Right Turbinates: Enlarged and swollen.     Right Sinus: Maxillary sinus tenderness and frontal sinus tenderness present.     Mouth/Throat:     Mouth: Mucous membranes are moist.     Pharynx: Oropharynx is clear. No oropharyngeal exudate or posterior oropharyngeal erythema.  Eyes:     Extraocular Movements: Extraocular movements intact.     Conjunctiva/sclera: Conjunctivae normal.     Pupils: Pupils are equal, round, and reactive to light.  Cardiovascular:     Rate and Rhythm: Normal rate and regular rhythm.     Pulses: Normal pulses.     Heart sounds: Normal heart sounds.  Pulmonary:     Effort: Pulmonary effort is normal.     Breath sounds: Normal breath sounds.  Abdominal:     General: Abdomen is flat. Bowel sounds are normal.     Palpations: Abdomen is soft.  Skin:    General: Skin is warm and dry.     Capillary Refill: Capillary refill takes less than 2 seconds.  Neurological:     General: No focal deficit present.     Mental Status: She is alert and oriented to person, place, and time. Mental status is at baseline.  Psychiatric:        Mood and Affect: Mood normal.        Behavior: Behavior normal.        Thought Content: Thought content normal.        Judgment: Judgment normal.     ED Results / Procedures / Treatments   Labs (all labs ordered are listed, but only abnormal results are displayed) Labs Reviewed - No data to display  EKG None  Radiology No results found.  Procedures Procedures    Medications Ordered in ED Medications  doxycycline (VIBRA-TABS) tablet 100 mg (has no administration in time range)  oxyCODONE (Oxy IR/ROXICODONE) immediate release tablet 5 mg (has no administration in time range)    ED  Course/ Medical Decision Making/ A&P                                 Medical Decision Making 52 y.o. female presenting with 3 days of progressively worsening facial pain and swelling. On arrival VSS. On clinical exam she is well appearing with obvious facial swelling located from her right nose to the right maxilla. The right nasal turbinate is edematous and erythematous. No pain around the orbit or with eye movement which is reassuring.  Based  on benign clinical exam without red flags, will prescribe 7-day course of doxycycline 100 mg twice daily and give short course of oxycodone for pain management.  Will also place referral to ENT for outpatient follow-up.  Patient in agreement with plan and received 1 dose of Doxycycline prior to leaving the ED.  Risk Prescription drug management.          Final Clinical Impression(s) / ED Diagnoses Final diagnoses:  Acute non-recurrent maxillary sinusitis    Rx / DC Orders ED Discharge Orders          Ordered    doxycycline (VIBRAMYCIN) 100 MG capsule  2 times daily        08/11/23 2159    oxyCODONE-acetaminophen (PERCOCET/ROXICET) 5-325 MG tablet  Every 6 hours PRN        08/11/23 2159    Ambulatory referral to ENT       Comments: Recurrent sinus infection   08/11/23 2200              Glendale Chard, DO 08/11/23 2201    Tegeler, Canary Brim, MD 08/12/23 [phone]

## 2023-08-11 NOTE — ED Triage Notes (Signed)
Pt c/o pain & swelling on left side of face and nose that started Tuesday. Pt reports pain in her right nare that felt like maybe a "pimple" then by yesterday morning her left face was swollen.

## 2023-08-11 NOTE — Discharge Instructions (Addendum)
Continue taking your antibiotic for 7 days. You received one dose prior to leaving the Emergency Department.

## 2023-08-28 ENCOUNTER — Encounter: Payer: Self-pay | Admitting: Pharmacist Clinician (PhC)/ Clinical Pharmacy Specialist

## 2023-09-01 NOTE — Plan of Care (Signed)
 [facility] Tonsillectomy/Adenoidectomy, Postoperative PEDS care plan entered in error.

## 2023-09-17 NOTE — Progress Notes (Unsigned)
Cardiology Clinic Note   Patient Name: Kathleen Marks Date of Encounter: 09/20/2023  Primary Care Provider:  Glenna Durand, PA-C Primary Cardiologist:  [name], MD  Patient Profile    Kathleen Marks 52 year old female presents the clinic today for follow-up evaluation of her coronary artery disease and hyperlipidemia.  Past Medical History    Past Medical History:  Diagnosis Date   Asthma    Diabetes mellitus without complication (HCC)    Hyperlipemia    Past Surgical History:  Procedure Laterality Date   ARTHROSCOPIC REPAIR ACL     CORONARY STENT INTERVENTION N/A 05/19/2023   Procedure: CORONARY STENT INTERVENTION;  Surgeon: Swaziland, Peter M, MD;  Location: [facility] INVASIVE CV LAB;  Service: Cardiovascular;  Laterality: N/A;   FEMUR SURGERY     LEFT HEART CATH AND CORONARY ANGIOGRAPHY N/A 05/19/2023   Procedure: LEFT HEART CATH AND CORONARY ANGIOGRAPHY;  Surgeon: Swaziland, Peter M, MD;  Location: [facility] INVASIVE CV LAB;  Service: Cardiovascular;  Laterality: N/A;    Allergies  Allergies  Allergen Reactions   Iodinated Contrast Media Shortness Of Breath and Anaphylaxis   Penicillins Hives   Bupropion Itching    Pt states she has psychological reaction to generic Wellbutrin   Clindamycin/Lincomycin Nausea And Vomiting   Tetanus Toxoids Itching and Other (See Comments)    Pain/knots in arms    Azithromycin Nausea And Vomiting and Nausea Only    States she can tolerate.    Blue Dyes (Parenteral) Rash    History of Present Illness    Kathleen Marks has a PMH of coronary artery disease, HLD, chronic systolic CHF, type 2 diabetes, tobacco abuse, asthma, and polysubstance abuse.  LHC 9/19 showed LAD 20%, proximal OM1 80%, CTO RCA, OM 2 99%, received PTCA/DES to proximal-mid OM2, LHC 9/21 mid LAD 30-40%, mid-distal LAD 20-30%, D1 50%, OM 2 100% in-stent restenosis, mid RCA 80-90%, PCI with DES to OM2.  LHC 7/24 D2 99%, mid LAD 90%, 40%, distal  LAD 40%, OM2 was 20%, lateral OM 290%, proximal RCA CTO.  Culprit lesion felt to be mid LAD.  She received PCI with DES to mid LAD and balloon angioplasty to D2.  EF noted to be 30-35% 7/24, mild dyskinesis of left ventricle, apical anterior septal wall and anterior wall, G1 DD, normal RV function and no significant valvular abnormalities.  She was seen in follow-up by Carlos Levering NP-C on 06/20/2023.  She had been previously evaluated by Dr. Rennis Golden 7/24 for chest pain during hospitalization.  She had presented to the emergency department 05/15/2023 with complaints of left-sided chest discomfort radiating to her jaw and shortness of breath.  Her troponins were noted to be 16-80.  Her blood sugar was greater than 300.  Her triglycerides were greater than 600.  She reported smoking 1 pack of cigarettes per day.  She denied cocaine use for 2 years.  Her EKG showed atrial fibrillation in the emergency department.  EKG was reviewed and appeared to be sinus tachycardia with possible atrial tachycardia.  Additional troponin were ordered.  Patient refused insulin.  Echocardiogram showed moderate LV dysfunction and significant LAD wall motion territory abnormality.  A repeat EKG showed 1-2 mm anterior ST elevation.  She wished to defer heart catheterization and left AMA in order to take care of her son with MR.  She returned to the emergency department 05/17/2023 to undergo LHC.  She continued intermittent chest discomfort and described a light grabbing type sensation.  Repeat troponins were drawn and were noted to be 573-620.  She underwent LHC with PCI and DES to her mid LAD and balloon angioplasty to D2.  She was scheduled for discharge on 05/22/2023.  However the evening of 05/21/2023 she signed out AMA.  She presented back to the emergency department 05/21/2023 to get IV antibiotics for cellulitis on her face.  She was given vancomycin.  At the time of ED intake she expressed concern over being able to afford Brilinta.   She was discharged on 05/22/2023.  She was seen in follow-up 06/20/2023.  During that time she reports continued episodes of a clinching type feeling.  She reported this was on the left side of her chest and would last for 30-60 minutes and resolve on its own.  She reported that it felt like a rippling across her chest from left to right.  She reported the sensation was happening more frequently.  It would occur with exertion.  She became very tearful and expressed her frustration over her treatment at the hospital.  She stated that she was not trying to be difficult but, needed to care for her son.  She felt that she was not being listened to her or allow to advocate for herself.  She reported a sensitivity to medications.  She has been able to arrange and limited transportation through [facility] which should help with her stress.  She continues to smoke 1 pack/day.  She was interested in quitting smoking.  She expressed interest in participating in cardiac rehab.  She presents to the clinic today for follow-up evaluation and states she stopped taking her medications about 2 months ago.  She reports that no one would refill her medications.  She feels that atorvastatin increased her blood sugar.  She reports that she did not have trouble with her blood sugar until taking the medication.  She also stopped taking her metoprolol and Brilinta.  She notes activity intolerance.  She does report that she is physically active constantly.  She continues to notice a grabbing sharp type pain daily.  She is tearful in the exam room today.  I reviewed her cardiac catheterization and importance of medication compliance.  She expressed understanding.  She reports that she continues to work with mental health professionals.  I have plan follow-up in 1-2 months.       Home Medications    Prior to Admission medications   Medication Sig Start Date End Date Taking? Authorizing Provider  acetaminophen (TYLENOL) 500 MG  tablet Take 500-1,000 mg by mouth every 6 (six) hours as needed for moderate pain.    [provider]  albuterol (VENTOLIN HFA) 108 (90 Base) MCG/ACT inhaler Inhale 2 puffs into the lungs every 4 (four) hours as needed for wheezing or shortness of breath. 05/22/23   Pokhrel, Rebekah Chesterfield, MD  aspirin EC 81 MG tablet Take 1 tablet (81 mg total) by mouth daily. Swallow whole. 05/22/23   Pokhrel, Rebekah Chesterfield, MD  atorvastatin (LIPITOR) 80 MG tablet Take 1 tablet (80 mg total) by mouth daily. 05/22/23   Pokhrel, Rebekah Chesterfield, MD  fluconazole (DIFLUCAN) 200 MG tablet Take 200 mg by mouth 2 (two) times daily. Patient not taking: Reported on 07/05/2023 06/06/23   [provider]  ibuprofen (ADVIL) 200 MG tablet Take 1 tablet (200 mg total) by mouth 2 (two) times daily as needed for moderate pain (pain). 05/22/23   Pokhrel, Rebekah Chesterfield, MD  metFORMIN (GLUCOPHAGE-XR) 500 MG 24 hr tablet Take 1 tablet (500 mg total)  by mouth daily with breakfast. 07/05/23   Hilty, Lisette Abu, MD  metoprolol tartrate (LOPRESSOR) 25 MG tablet Take 1 tablet (25 mg total) by mouth 2 (two) times daily. 05/22/23   Pokhrel, Rebekah Chesterfield, MD  oxyCODONE-acetaminophen (PERCOCET/ROXICET) 5-325 MG tablet Take 1 tablet by mouth every 6 (six) hours as needed for severe pain. 08/11/23   Glendale Chard, DO  ticagrelor (BRILINTA) 90 MG TABS tablet Take 1 tablet (90 mg total) by mouth 2 (two) times daily. 05/22/23   Joycelyn Das, MD    Family History    History reviewed. No pertinent family history. has no family status information on file.   Social History    Social History   Socioeconomic History   Marital status: Divorced    Spouse name: Not on file   Number of children: Not on file   Years of education: Not on file   Highest education level: Not on file  Occupational History   Not on file  Tobacco Use   Smoking status: Every Day    Current packs/day: 0.50    Types: Cigarettes   Smokeless tobacco: Not on file  Substance and Sexual Activity    Alcohol use: No   Drug use: No   Sexual activity: Not on file  Other Topics Concern   Not on file  Social History Narrative   Not on file   Social Determinants of Health   Financial Resource Strain: Medium Risk (01/04/2023)   Received from [facility], [facility]   Overall Financial Resource Strain (CARDIA)    Difficulty of Paying Living Expenses: Somewhat hard  Food Insecurity: No Food Insecurity (05/18/2023)   Hunger Vital Sign    Worried About Running Out of Food in the Last Year: Never true    Ran Out of Food in the Last Year: Never true  Recent Concern: Food Insecurity - Food Insecurity Present (05/16/2023)   Hunger Vital Sign    Worried About Running Out of Food in the Last Year: Sometimes true    Ran Out of Food in the Last Year: Sometimes true  Transportation Needs: Unmet Transportation Needs (05/18/2023)   PRAPARE - Administrator, Civil Service (Medical): Yes    Lack of Transportation (Non-Medical): Yes  Physical Activity: Unknown (01/04/2023)   Received from [facility], [facility]   Exercise Vital Sign    Days of Exercise per Week: 0 days    Minutes of Exercise per Session: Not on file  Stress: Stress Concern Present (01/04/2023)   Received from [facility], [facility]   [employer] of Occupational Health - Occupational Stress Questionnaire    Feeling of Stress : Rather much  Social Connections: Socially Isolated (01/04/2023)   Received from [facility], [facility]   Social Network    How would you rate your social network (family, work, friends)?: Little participation, lonely and socially isolated  Intimate Partner Violence: Not At Risk (05/18/2023)   Humiliation, Afraid, Rape, and Kick questionnaire    Fear of Current or Ex-Partner: No    Emotionally Abused: No    Physically Abused: No    Sexually Abused: No     Review of Systems    General:  No chills, fever, night sweats or weight changes.  Cardiovascular:  No chest  pain, dyspnea on exertion, edema, orthopnea, palpitations, paroxysmal nocturnal dyspnea. Dermatological: No rash, lesions/masses Respiratory: No cough, dyspnea Urologic: No hematuria, dysuria Abdominal:   No nausea, vomiting, diarrhea, bright red blood per rectum, melena, or  hematemesis Neurologic:  No visual changes, wkns, changes in mental status. All other systems reviewed and are otherwise negative except as noted above.  Physical Exam    VS:  BP 126/72 (BP Location: Left Arm, Patient Position: Sitting, Cuff Size: Large)   Pulse 98   Ht 5\' 3"  (1.6 m)   Wt 197 lb (89.4 kg)   BMI 34.90 kg/m  , BMI Body mass index is 34.9 kg/m. GEN: Well nourished, well developed, in no acute distress. HEENT: normal. Neck: Supple, no JVD, carotid bruits, or masses. Cardiac: RRR, no murmurs, rubs, or gallops. No clubbing, cyanosis, edema.  Radials/DP/PT 2+ and equal bilaterally.  Respiratory:  Respirations regular and unlabored, clear to auscultation bilaterally. GI: Soft, nontender, nondistended, BS + x 4. MS: no deformity or atrophy. Skin: warm and dry, no rash. Neuro:  Strength and sensation are intact. Psych: Normal affect.  Accessory Clinical Findings    Recent Labs: 05/16/2023: TSH 0.774 05/21/2023: ALT 33 05/22/2023: BUN 14; Creatinine, Ser 0.68; Hemoglobin 13.6; Magnesium 1.5; Platelets 279; Potassium 3.6; Sodium 133   Recent Lipid Panel    Component Value Date/Time   CHOL 273 (H) 05/16/2023 0622   TRIG 645 (H) 05/16/2023 0622   HDL 32 (L) 05/16/2023 0622   CHOLHDL 8.5 05/16/2023 0622   VLDL UNABLE TO CALCULATE IF TRIGLYCERIDE OVER 400 mg/dL 16/08/9603 5409   LDLCALC UNABLE TO CALCULATE IF TRIGLYCERIDE OVER 400 mg/dL 81/19/1478 2956   LDLDIRECT 165 (H) 05/16/2023 0622         ECG personally reviewed by me today- EKG Interpretation Date/Time:  Wednesday September 20 2023 14:25:02 EST Ventricular Rate:  98 PR Interval:  134 QRS Duration:  82 QT Interval:  348 QTC  Calculation: 444 R Axis:   23  Text Interpretation: Normal sinus rhythm Low voltage QRS Cannot rule out Anterior infarct , age undetermined When compared with ECG of 20-Jun-2023 15:02, No significant change was found Confirmed by Edd Fabian [phone]) on 09/20/2023 2:30:09 PM   LHC 05/19/2023    2nd Diag lesion is 99% stenosed.   Mid LAD-1 lesion is 90% stenosed.   Mid LAD-2 lesion is 40% stenosed.   Dist LAD lesion is 40% stenosed.   2nd Mrg lesion is 20% stenosed.   Lat 2nd Mrg lesion is 90% stenosed.   Prox RCA lesion is 100% stenosed.   A drug-eluting stent was successfully placed using a SYNERGY [id number] [id number].   Balloon angioplasty was performed using a BALLN EMERGE MR [id number].   Post intervention, there is a 0% residual stenosis.   Post intervention, there is a 50% residual stenosis.   LV end diastolic pressure is normal.   Recommend uninterrupted dual antiplatelet therapy with Aspirin 81mg  daily and Ticagrelor 90mg  twice daily for a minimum of 12 months (ACS-Class I recommendation).   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 Subtotal second diagonal. Stents in OM2 are still patent. Chronic occlusion of a small nondominant RCA Normal LVEDP Successful PCI of the mid LAD with 2.75 x 16 mm Synergy stent post dilated to 3.0 Successful balloon angioplasty of the second diagonal   Plan: DAPT for one year. Aggressive risk factor modification. Guideline directed medical therapy for LV dysfunction.  Echocardiogram 05/16/2023  IMPRESSIONS     1. There is no left ventricular thrombus (Definity contrast was used),  but there is apical swirling/slow flow. Left ventricular ejection  fraction, by estimation, is 30 to 35%. The left ventricle has moderately  decreased function. The  left ventricle  demonstrates regional wall motion abnormalities (see scoring  diagram/findings for description). Left ventricular diastolic parameters  are consistent with Grade I  diastolic dysfunction (impaired relaxation).  There is mild dyskinesis of the left  ventricular, apical anteroseptal wall and anterior wall. There is severe  hypokinesis of the left ventricular, mid-apical anteroseptal wall and  anterior wall.   2. Right ventricular systolic function is normal. The right ventricular  size is normal. Tricuspid regurgitation signal is inadequate for assessing  PA pressure.   3. The mitral valve is normal in structure. No evidence of mitral valve  regurgitation.   4. The aortic valve is tricuspid. Aortic valve regurgitation is not  visualized.   5. The inferior vena cava is normal in size with greater than 50%  respiratory variability, suggesting right atrial pressure of 3 mmHg.   FINDINGS   Left Ventricle: There is no left ventricular thrombus (Definity contrast  was used), but there is apical swirling/slow flow. Left ventricular  ejection fraction, by estimation, is 30 to 35%. The left ventricle has  moderately decreased function. The left  ventricle demonstrates regional wall motion abnormalities. Mild dyskinesis  of the left ventricular, apical anteroseptal wall and anterior wall.  Severe hypokinesis of the left ventricular, mid-apical anteroseptal wall  and anterior wall. Definity contrast  agent was given IV to delineate the left ventricular endocardial borders.  The left ventricular internal cavity size was normal in size. There is no  left ventricular hypertrophy. Left ventricular diastolic parameters are  consistent with Grade I diastolic  dysfunction (impaired relaxation). Normal left ventricular filling  pressure.     LV Wall Scoring:  The apical septal segment, apical anterior segment, and apex are  dyskinetic.  The mid anteroseptal segment, apical lateral segment, mid anterior  segment,  and apical inferior segment are hypokinetic. The antero-lateral wall,  inferior wall, posterior wall, basal anteroseptal segment, mid  inferoseptal   segment, basal anterior segment, and basal inferoseptal segment are  normal.   Right Ventricle: The right ventricular size is normal. No increase in  right ventricular wall thickness. Right ventricular systolic function is  normal. Tricuspid regurgitation signal is inadequate for assessing PA  pressure.   Left Atrium: Left atrial size was normal in size.   Right Atrium: Right atrial size was normal in size.   Pericardium: There is no evidence of pericardial effusion.   Mitral Valve: The mitral valve is normal in structure. No evidence of  mitral valve regurgitation.   Tricuspid Valve: The tricuspid valve is normal in structure. Tricuspid  valve regurgitation is not demonstrated.   Aortic Valve: The aortic valve is tricuspid. Aortic valve regurgitation is  not visualized. Aortic valve mean gradient measures 4.0 mmHg. Aortic valve  peak gradient measures 6.0 mmHg. Aortic valve area, by VTI measures 2.28  cm.   Pulmonic Valve: The pulmonic valve was not well visualized. Pulmonic valve  regurgitation is not visualized. No evidence of pulmonic stenosis.   Aorta: The aortic root is normal in size and structure.   Venous: The inferior vena cava is normal in size with greater than 50%  respiratory variability, suggesting right atrial pressure of 3 mmHg.   IAS/Shunts: No atrial level shunt detected by color flow Doppler.        Assessment & Plan   1.  Coronary artery disease-no chest pain in clinic today.  Continues to note intermittent periods of chest discomfort.  STEMI 7/24 with PCI and DES to mid LAD with angioplasty  to D2.    Continue  aspirin,  nitroglycerin as needed Stopped taking Brilinta, metoprolol  Resume Brilinta and metoprolol-stressed importance Heart healthy low-sodium diet Increase physical activity as tolerated Restart cardiac rehab  HLD-reviewed high fiber diet. Reports that atorvastatin was making her blood sugar elevated High-fiber diet Start Crestor  40 mg daily Increase physical activity as tolerated  Chronic systolic CHF-weight stable.  Euvolemic.  724 echocardiogram showed an EF of 30-35% and G1 DD.  She was previously hesitant to add GDMT. Continue metoprolol Daily weights Heart healthy low-sodium diet Elevate lower extremities when not active  Tobacco abuse- Now smoking 1 pack per day. Smoking cessation strongly encouraged  Type 2 diabetes-glucose 254 on 05/22/2023 Stopped metformin Carb modified diet Follows with PCP  Disposition: Follow-up with Dr. Rennis Golden or me in 1-2 months.   Thomasene Ripple. Placido Hangartner NP-C     09/20/2023, 2:57 PM [facility] [address] Office [phone] Fax [phone]    I spent 14 minutes examining this patient, reviewing medications, and using patient centered shared decision making involving her cardiac care.   I spent greater than 20 minutes reviewing her past medical history,  medications, and prior cardiac tests.

## 2023-09-20 ENCOUNTER — Encounter: Payer: Self-pay | Admitting: General Practice

## 2023-09-20 ENCOUNTER — Ambulatory Visit: Payer: Medicare HMO | Attending: General Practice | Admitting: General Practice

## 2023-09-20 VITALS — BP 126/72 | HR 98 | Ht 63.0 in | Wt 197.0 lb

## 2023-09-20 DIAGNOSIS — I5022 Chronic systolic (congestive) heart failure: Secondary | ICD-10-CM

## 2023-09-20 DIAGNOSIS — E782 Mixed hyperlipidemia: Secondary | ICD-10-CM | POA: Diagnosis not present

## 2023-09-20 DIAGNOSIS — Z720 Tobacco use: Secondary | ICD-10-CM

## 2023-09-20 DIAGNOSIS — I259 Chronic ischemic heart disease, unspecified: Secondary | ICD-10-CM | POA: Diagnosis not present

## 2023-09-20 DIAGNOSIS — I2510 Atherosclerotic heart disease of native coronary artery without angina pectoris: Secondary | ICD-10-CM

## 2023-09-20 DIAGNOSIS — Z9861 Coronary angioplasty status: Secondary | ICD-10-CM

## 2023-09-20 DIAGNOSIS — I214 Non-ST elevation (NSTEMI) myocardial infarction: Secondary | ICD-10-CM

## 2023-09-20 MED ORDER — METOPROLOL TARTRATE 25 MG PO TABS: 25.0000 mg | ORAL_TABLET | Freq: Two times a day (BID) | ORAL | 1 refills | Status: DC

## 2023-09-20 MED ORDER — ROSUVASTATIN CALCIUM 40 MG PO TABS: 40.0000 mg | ORAL_TABLET | Freq: Every day | ORAL | 1 refills | Status: DC

## 2023-09-20 MED ORDER — TICAGRELOR 90 MG PO TABS: 90.0000 mg | ORAL_TABLET | Freq: Two times a day (BID) | ORAL | 1 refills | Status: DC

## 2023-09-20 MED ORDER — ATORVASTATIN CALCIUM 80 MG PO TABS: 80.0000 mg | ORAL_TABLET | Freq: Every day | ORAL | 1 refills | Status: DC

## 2023-09-20 NOTE — Patient Instructions (Signed)
Medication Instructions:  RESTART ASPIRIN 81MG , ATORVASTATIN 80MG , ROSUVASTATIN 40MG , METOPROLOL TART 25MG  TWICE DAILY AND BRILINTA 90MG  TWICE DAILY *If you need a refill on your cardiac medications before your next appointment, please call your pharmacy*  Lab Work: NONE  Other Instructions INCREASE PHYSICAL ACTIVITY-AS TOLERATED PLEASE READ AND FOLLOW ATTACHED  SALTY 6   Follow-Up: At [facility], you and your health needs are our priority.  As part of our continuing mission to provide you with exceptional heart care, we have created designated Provider Care Teams.  These Care Teams include your primary Cardiologist (physician) and Advanced Practice Providers (APPs -  Physician Assistants and Nurse Practitioners) who all work together to provide you with the care you need, when you need it.  Your next appointment:   1-2 month(s)  Provider:   [name], MD  or Edd Fabian, FNP

## 2023-10-23 NOTE — Progress Notes (Deleted)
Cardiology Clinic Note   Patient Name: Kathleen Marks Date of Encounter: 10/23/2023  Primary Care Provider:  Glenna Durand, PA-C Primary Cardiologist:  [name], MD  Patient Profile    Kathleen Marks 52 year old female presents the clinic today for follow-up evaluation of her coronary artery disease and hyperlipidemia.  Past Medical History    Past Medical History:  Diagnosis Date   Asthma    Diabetes mellitus without complication (HCC)    Hyperlipemia    Past Surgical History:  Procedure Laterality Date   ARTHROSCOPIC REPAIR ACL     CORONARY STENT INTERVENTION N/A 05/19/2023   Procedure: CORONARY STENT INTERVENTION;  Surgeon: Swaziland, Peter M, MD;  Location: [facility] INVASIVE CV LAB;  Service: Cardiovascular;  Laterality: N/A;   FEMUR SURGERY     LEFT HEART CATH AND CORONARY ANGIOGRAPHY N/A 05/19/2023   Procedure: LEFT HEART CATH AND CORONARY ANGIOGRAPHY;  Surgeon: Swaziland, Peter M, MD;  Location: [facility] INVASIVE CV LAB;  Service: Cardiovascular;  Laterality: N/A;    Allergies  Allergies  Allergen Reactions   Iodinated Contrast Media Shortness Of Breath and Anaphylaxis   Penicillins Hives   Bupropion Itching    Pt states she has psychological reaction to generic Wellbutrin   Clindamycin/Lincomycin Nausea And Vomiting   Tetanus Toxoids Itching and Other (See Comments)    Pain/knots in arms    Azithromycin Nausea And Vomiting and Nausea Only    States she can tolerate.    Blue Dyes (Parenteral) Rash    History of Present Illness    Kathleen Marks has a PMH of coronary artery disease, HLD, chronic systolic CHF, type 2 diabetes, tobacco abuse, asthma, and polysubstance abuse.  LHC 9/19 showed LAD 20%, proximal OM1 80%, CTO RCA, OM 2 99%, received PTCA/DES to proximal-mid OM2, LHC 9/21 mid LAD 30-40%, mid-distal LAD 20-30%, D1 50%, OM 2 100% in-stent restenosis, mid RCA 80-90%, PCI with DES to OM2.  LHC 7/24 D2 99%, mid LAD 90%, 40%, distal  LAD 40%, OM2 was 20%, lateral OM 290%, proximal RCA CTO.  Culprit lesion felt to be mid LAD.  She received PCI with DES to mid LAD and balloon angioplasty to D2.  EF noted to be 30-35% 7/24, mild dyskinesis of left ventricle, apical anterior septal wall and anterior wall, G1 DD, normal RV function and no significant valvular abnormalities.  She was seen in follow-up by Carlos Levering NP-C on 06/20/2023.  She had been previously evaluated by Dr. Rennis Golden 7/24 for chest pain during hospitalization.  She had presented to the emergency department 05/15/2023 with complaints of left-sided chest discomfort radiating to her jaw and shortness of breath.  Her troponins were noted to be 16-80.  Her blood sugar was greater than 300.  Her triglycerides were greater than 600.  She reported smoking 1 pack of cigarettes per day.  She denied cocaine use for 2 years.  Her EKG showed atrial fibrillation in the emergency department.  EKG was reviewed and appeared to be sinus tachycardia with possible atrial tachycardia.  Additional troponin were ordered.  Patient refused insulin.  Echocardiogram showed moderate LV dysfunction and significant LAD wall motion territory abnormality.  A repeat EKG showed 1-2 mm anterior ST elevation.  She wished to defer heart catheterization and left AMA in order to take care of her son with MR.  She returned to the emergency department 05/17/2023 to undergo LHC.  She continued intermittent chest discomfort and described a light grabbing type sensation.  Repeat troponins were drawn and were noted to be 573-620.  She underwent LHC with PCI and DES to her mid LAD and balloon angioplasty to D2.  She was scheduled for discharge on 05/22/2023.  However the evening of 05/21/2023 she signed out AMA.  She presented back to the emergency department 05/21/2023 to get IV antibiotics for cellulitis on her face.  She was given vancomycin.  At the time of ED intake she expressed concern over being able to afford Brilinta.   She was discharged on 05/22/2023.  She was seen in follow-up 06/20/2023.  During that time she reports continued episodes of a clinching type feeling.  She reported this was on the left side of her chest and would last for 30-60 minutes and resolve on its own.  She reported that it felt like a rippling across her chest from left to right.  She reported the sensation was happening more frequently.  It would occur with exertion.  She became very tearful and expressed her frustration over her treatment at the hospital.  She stated that she was not trying to be difficult but, needed to care for her son.  She felt that she was not being listened to her or allow to advocate for herself.  She reported a sensitivity to medications.  She has been able to arrange and limited transportation through [facility] which should help with her stress.  She continues to smoke 1 pack/day.  She was interested in quitting smoking.  She expressed interest in participating in cardiac rehab.  She presented to the clinic 09/20/23 for follow-up evaluation and stated she stopped taking her medications about 2 months prior.  She reported that no one would refill her medications.  She felt that atorvastatin increased her blood sugar.  She reported that she did not have trouble with her blood sugar until taking the medication.  She also stopped taking her metoprolol and Brilinta.  She noted activity intolerance.  She did report that she was physically active constantly.  She continued to notice a grabbing sharp type pain daily.  She was tearful in the exam room.  I reviewed her cardiac catheterization and importance of medication compliance.  She expressed understanding.  She reported that she continued to work with mental health professionals.  I  planned follow-up in 1-2 months.  She presents to the clinic today for follow-up evaluation and states***.  *** denies chest pain, shortness of breath, lower extremity edema, fatigue, palpitations,  melena, hematuria, hemoptysis, diaphoresis, weakness, presyncope, syncope, orthopnea, and PND.        Home Medications    Prior to Admission medications   Medication Sig Start Date End Date Taking? Authorizing Provider  acetaminophen (TYLENOL) 500 MG tablet Take 500-1,000 mg by mouth every 6 (six) hours as needed for moderate pain.    [provider]  albuterol (VENTOLIN HFA) 108 (90 Base) MCG/ACT inhaler Inhale 2 puffs into the lungs every 4 (four) hours as needed for wheezing or shortness of breath. 05/22/23   Pokhrel, Rebekah Chesterfield, MD  aspirin EC 81 MG tablet Take 1 tablet (81 mg total) by mouth daily. Swallow whole. 05/22/23   Pokhrel, Rebekah Chesterfield, MD  atorvastatin (LIPITOR) 80 MG tablet Take 1 tablet (80 mg total) by mouth daily. 05/22/23   Pokhrel, Rebekah Chesterfield, MD  fluconazole (DIFLUCAN) 200 MG tablet Take 200 mg by mouth 2 (two) times daily. Patient not taking: Reported on 07/05/2023 06/06/23   [provider]  ibuprofen (ADVIL) 200 MG tablet Take 1 tablet (  200 mg total) by mouth 2 (two) times daily as needed for moderate pain (pain). 05/22/23   Pokhrel, Rebekah Chesterfield, MD  metFORMIN (GLUCOPHAGE-XR) 500 MG 24 hr tablet Take 1 tablet (500 mg total) by mouth daily with breakfast. 07/05/23   Hilty, Lisette Abu, MD  metoprolol tartrate (LOPRESSOR) 25 MG tablet Take 1 tablet (25 mg total) by mouth 2 (two) times daily. 05/22/23   Pokhrel, Rebekah Chesterfield, MD  oxyCODONE-acetaminophen (PERCOCET/ROXICET) 5-325 MG tablet Take 1 tablet by mouth every 6 (six) hours as needed for severe pain. 08/11/23   Glendale Chard, DO  ticagrelor (BRILINTA) 90 MG TABS tablet Take 1 tablet (90 mg total) by mouth 2 (two) times daily. 05/22/23   Pokhrel, Rebekah Chesterfield, MD    Family History    No family history on file. has no family status information on file.   Social History    Social History   Socioeconomic History   Marital status: Divorced    Spouse name: Not on file   Number of children: Not on file   Years of education: Not on  file   Highest education level: Not on file  Occupational History   Not on file  Tobacco Use   Smoking status: Every Day    Current packs/day: 0.50    Types: Cigarettes   Smokeless tobacco: Not on file  Substance and Sexual Activity   Alcohol use: No   Drug use: No   Sexual activity: Not on file  Other Topics Concern   Not on file  Social History Narrative   Not on file   Social Drivers of Health   Financial Resource Strain: Medium Risk (01/04/2023)   Received from [facility], [facility]   Overall Financial Resource Strain (CARDIA)    Difficulty of Paying Living Expenses: Somewhat hard  Food Insecurity: No Food Insecurity (05/18/2023)   Hunger Vital Sign    Worried About Running Out of Food in the Last Year: Never true    Ran Out of Food in the Last Year: Never true  Recent Concern: Food Insecurity - Food Insecurity Present (05/16/2023)   Hunger Vital Sign    Worried About Running Out of Food in the Last Year: Sometimes true    Ran Out of Food in the Last Year: Sometimes true  Transportation Needs: Unmet Transportation Needs (05/18/2023)   PRAPARE - Administrator, Civil Service (Medical): Yes    Lack of Transportation (Non-Medical): Yes  Physical Activity: Unknown (01/04/2023)   Received from [facility], [facility]   Exercise Vital Sign    Days of Exercise per Week: 0 days    Minutes of Exercise per Session: Not on file  Stress: Stress Concern Present (01/04/2023)   Received from [facility], [facility]   [employer] of Occupational Health - Occupational Stress Questionnaire    Feeling of Stress : Rather much  Social Connections: Socially Isolated (01/04/2023)   Received from [facility], [facility]   Social Network    How would you rate your social network (family, work, friends)?: Little participation, lonely and socially isolated  Intimate Partner Violence: Not At Risk (05/18/2023)   Humiliation, Afraid, Rape, and Kick questionnaire     Fear of Current or Ex-Partner: No    Emotionally Abused: No    Physically Abused: No    Sexually Abused: No     Review of Systems    General:  No chills, fever, night sweats or weight changes.  Cardiovascular:  No chest pain, dyspnea  on exertion, edema, orthopnea, palpitations, paroxysmal nocturnal dyspnea. Dermatological: No rash, lesions/masses Respiratory: No cough, dyspnea Urologic: No hematuria, dysuria Abdominal:   No nausea, vomiting, diarrhea, bright red blood per rectum, melena, or hematemesis Neurologic:  No visual changes, wkns, changes in mental status. All other systems reviewed and are otherwise negative except as noted above.  Physical Exam    VS:  There were no vitals taken for this visit. , BMI There is no height or weight on file to calculate BMI. GEN: Well nourished, well developed, in no acute distress. HEENT: normal. Neck: Supple, no JVD, carotid bruits, or masses. Cardiac: RRR, no murmurs, rubs, or gallops. No clubbing, cyanosis, edema.  Radials/DP/PT 2+ and equal bilaterally.  Respiratory:  Respirations regular and unlabored, clear to auscultation bilaterally. GI: Soft, nontender, nondistended, BS + x 4. MS: no deformity or atrophy. Skin: warm and dry, no rash. Neuro:  Strength and sensation are intact. Psych: Normal affect.  Accessory Clinical Findings    Recent Labs: 05/16/2023: TSH 0.774 05/21/2023: ALT 33 05/22/2023: BUN 14; Creatinine, Ser 0.68; Hemoglobin 13.6; Magnesium 1.5; Platelets 279; Potassium 3.6; Sodium 133   Recent Lipid Panel    Component Value Date/Time   CHOL 273 (H) 05/16/2023 0622   TRIG 645 (H) 05/16/2023 0622   HDL 32 (L) 05/16/2023 0622   CHOLHDL 8.5 05/16/2023 0622   VLDL UNABLE TO CALCULATE IF TRIGLYCERIDE OVER 400 mg/dL 16/08/9603 5409   LDLCALC UNABLE TO CALCULATE IF TRIGLYCERIDE OVER 400 mg/dL 81/19/1478 2956   LDLDIRECT 165 (H) 05/16/2023 0622    No BP recorded.  {Refresh Note OR Click here to enter BP  :1}***     ECG personally reviewed by me today- EKG Interpretation Date/Time:    Ventricular Rate:    PR Interval:    QRS Duration:    QT Interval:    QTC Calculation:   R Axis:      Text Interpretation:     LHC 05/19/2023    2nd Diag lesion is 99% stenosed.   Mid LAD-1 lesion is 90% stenosed.   Mid LAD-2 lesion is 40% stenosed.   Dist LAD lesion is 40% stenosed.   2nd Mrg lesion is 20% stenosed.   Lat 2nd Mrg lesion is 90% stenosed.   Prox RCA lesion is 100% stenosed.   A drug-eluting stent was successfully placed using a SYNERGY [id number] [id number].   Balloon angioplasty was performed using a BALLN EMERGE MR [id number].   Post intervention, there is a 0% residual stenosis.   Post intervention, there is a 50% residual stenosis.   LV end diastolic pressure is normal.   Recommend uninterrupted dual antiplatelet therapy with Aspirin 81mg  daily and Ticagrelor 90mg  twice daily for a minimum of 12 months (ACS-Class I recommendation).   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 Subtotal second diagonal. Stents in OM2 are still patent. Chronic occlusion of a small nondominant RCA Normal LVEDP Successful PCI of the mid LAD with 2.75 x 16 mm Synergy stent post dilated to 3.0 Successful balloon angioplasty of the second diagonal   Plan: DAPT for one year. Aggressive risk factor modification. Guideline directed medical therapy for LV dysfunction.  Echocardiogram 05/16/2023  IMPRESSIONS     1. There is no left ventricular thrombus (Definity contrast was used),  but there is apical swirling/slow flow. Left ventricular ejection  fraction, by estimation, is 30 to 35%. The left ventricle has moderately  decreased function. The left ventricle  demonstrates regional wall motion abnormalities (  see scoring  diagram/findings for description). Left ventricular diastolic parameters  are consistent with Grade I diastolic dysfunction (impaired relaxation).  There is mild dyskinesis  of the left  ventricular, apical anteroseptal wall and anterior wall. There is severe  hypokinesis of the left ventricular, mid-apical anteroseptal wall and  anterior wall.   2. Right ventricular systolic function is normal. The right ventricular  size is normal. Tricuspid regurgitation signal is inadequate for assessing  PA pressure.   3. The mitral valve is normal in structure. No evidence of mitral valve  regurgitation.   4. The aortic valve is tricuspid. Aortic valve regurgitation is not  visualized.   5. The inferior vena cava is normal in size with greater than 50%  respiratory variability, suggesting right atrial pressure of 3 mmHg.   FINDINGS   Left Ventricle: There is no left ventricular thrombus (Definity contrast  was used), but there is apical swirling/slow flow. Left ventricular  ejection fraction, by estimation, is 30 to 35%. The left ventricle has  moderately decreased function. The left  ventricle demonstrates regional wall motion abnormalities. Mild dyskinesis  of the left ventricular, apical anteroseptal wall and anterior wall.  Severe hypokinesis of the left ventricular, mid-apical anteroseptal wall  and anterior wall. Definity contrast  agent was given IV to delineate the left ventricular endocardial borders.  The left ventricular internal cavity size was normal in size. There is no  left ventricular hypertrophy. Left ventricular diastolic parameters are  consistent with Grade I diastolic  dysfunction (impaired relaxation). Normal left ventricular filling  pressure.     LV Wall Scoring:  The apical septal segment, apical anterior segment, and apex are  dyskinetic.  The mid anteroseptal segment, apical lateral segment, mid anterior  segment,  and apical inferior segment are hypokinetic. The antero-lateral wall,  inferior wall, posterior wall, basal anteroseptal segment, mid  inferoseptal  segment, basal anterior segment, and basal inferoseptal segment are   normal.   Right Ventricle: The right ventricular size is normal. No increase in  right ventricular wall thickness. Right ventricular systolic function is  normal. Tricuspid regurgitation signal is inadequate for assessing PA  pressure.   Left Atrium: Left atrial size was normal in size.   Right Atrium: Right atrial size was normal in size.   Pericardium: There is no evidence of pericardial effusion.   Mitral Valve: The mitral valve is normal in structure. No evidence of  mitral valve regurgitation.   Tricuspid Valve: The tricuspid valve is normal in structure. Tricuspid  valve regurgitation is not demonstrated.   Aortic Valve: The aortic valve is tricuspid. Aortic valve regurgitation is  not visualized. Aortic valve mean gradient measures 4.0 mmHg. Aortic valve  peak gradient measures 6.0 mmHg. Aortic valve area, by VTI measures 2.28  cm.   Pulmonic Valve: The pulmonic valve was not well visualized. Pulmonic valve  regurgitation is not visualized. No evidence of pulmonic stenosis.   Aorta: The aortic root is normal in size and structure.   Venous: The inferior vena cava is normal in size with greater than 50%  respiratory variability, suggesting right atrial pressure of 3 mmHg.   IAS/Shunts: No atrial level shunt detected by color flow Doppler.        Assessment & Plan   1.  Coronary artery disease- Denies exertional  chest discomfort .  Does occasionally note intermittent brief periods of chest discomfort which she describes as a sharp stabbing type discomfort.  STEMI 7/24 with PCI and DES to mid  LAD with angioplasty to D2.   During previous visit noted issues with medication noncompliance. Continue  aspirin,  nitroglycerin as needed, Brilinta, metoprolol Heart healthy low-sodium diet-reviewed Continue cardiac rehab.  HLD-tolerating rosuvastatin well. High-fiber diet Increase physical activity as tolerated Plan to repeat fasting lipids and LFTs in 4  weeks.  Chronic systolic CHF-weight today***.  Euvolemic.  724 echocardiogram showed an EF of 30-35% and G1 DD.  She was previously hesitant to add GDMT. Continue metoprolol Start losartan 25 mg daily Daily weights Heart healthy low-sodium diet Elevate lower extremities when not active  Tobacco abuse-currently smoking less than 1 pack per day. Smoking cessation strongly encouraged Smoking cessation information provided  Disposition: Follow-up with Dr. Rennis Golden or me in 1-2 months.   Thomasene Ripple. Annabeth Tortora NP-C     10/23/2023, 4:04 PM [facility] [address] Office [phone] Fax [phone]    I spent 14*** minutes examining this patient, reviewing medications, and using patient centered shared decision making involving her cardiac care.   I spent greater than 20 minutes reviewing her past medical history,  medications, and prior cardiac tests.

## 2023-10-26 ENCOUNTER — Ambulatory Visit: Payer: Medicare HMO | Attending: General Practice | Admitting: General Practice

## 2023-11-04 ENCOUNTER — Encounter (HOSPITAL_BASED_OUTPATIENT_CLINIC_OR_DEPARTMENT_OTHER): Payer: Self-pay

## 2023-11-04 ENCOUNTER — Emergency Department (HOSPITAL_BASED_OUTPATIENT_CLINIC_OR_DEPARTMENT_OTHER)
Admission: EM | Admit: 2023-11-04 | Discharge: 2023-11-04 | Disposition: A | Discharge status: Home / Self Care | Payer: Medicare HMO | Attending: Emergency Medicine | Admitting: Emergency Medicine

## 2023-11-04 ENCOUNTER — Other Ambulatory Visit: Payer: Self-pay

## 2023-11-04 DIAGNOSIS — Z7982 Long term (current) use of aspirin: Secondary | ICD-10-CM | POA: Diagnosis not present

## 2023-11-04 DIAGNOSIS — R220 Localized swelling, mass and lump, head: Secondary | ICD-10-CM | POA: Diagnosis present

## 2023-11-04 DIAGNOSIS — E119 Type 2 diabetes mellitus without complications: Secondary | ICD-10-CM | POA: Insufficient documentation

## 2023-11-04 DIAGNOSIS — K130 Diseases of lips: Secondary | ICD-10-CM | POA: Diagnosis not present

## 2023-11-04 MED ORDER — DOXYCYCLINE HYCLATE 100 MG PO CAPS: 100.0000 mg | ORAL_CAPSULE | Freq: Two times a day (BID) | ORAL | 0 refills | Status: AC

## 2023-11-04 MED ORDER — FLUCONAZOLE 200 MG PO TABS: 200.0000 mg | ORAL_TABLET | Freq: Once | ORAL | 0 refills | Status: AC

## 2023-11-04 NOTE — ED Triage Notes (Signed)
The patient has redness and swelling to upper lip. She stated she had an area that looked like a pimp three days ago. Then the swelling has gotten worse.

## 2023-11-04 NOTE — Discharge Instructions (Addendum)
You have an infection of your lip.  You have been prescribed doxycycline. Take this antibiotic 2 times a day for the next 7 days. Take the full course of your antibiotic even if you start feeling better. Antibiotics may cause you to have diarrhea.  Please apply warm compresses to your lip for 10 to 15 minutes at a time, 3-4 times a day to help promote drainage.  You have been prescribed Diflucan that you may take if you develop a yeast infection.  Please follow-up with your PCP within the next 3 to 4 days for recheck of your lip infection.  You may take up to 1000mg  of tylenol every 6 hours as needed for pain.  Do not take more then 4g per day.  You may use up to 800mg  ibuprofen every 8 hours as needed for pain.  Do not exceed 2.4g of ibuprofen per day.  Return the ER for any fevers, severe worsening of your lip pain or swelling, any difficulties breathing or swallowing, any other new or concerning symptoms.

## 2023-11-04 NOTE — ED Notes (Signed)

## 2023-11-04 NOTE — ED Provider Notes (Cosign Needed)
[facility] Provider Note   CSN: [id number] Arrival date & time: 11/04/23  1253     History  Chief Complaint  Patient presents with   Facial Swelling    Kathleen Marks is a 52 y.o. female with history of diabetes, hyperlipidemia, presents with concern for swelling over her lip for the last day or 2.  States she had a pimple there and popped it 3 days ago.  Since then, the area has become red swollen and painful.  Denies any further discharge from the area.  Denies any fever or chills.  Denies any recent dental work.  HPI     Home Medications Prior to Admission medications   Medication Sig Start Date End Date Taking? Authorizing Provider  doxycycline (VIBRAMYCIN) 100 MG capsule Take 1 capsule (100 mg total) by mouth 2 (two) times daily for 7 days. 11/04/23 11/11/23 Yes Arabella Merles, PA-C  fluconazole (DIFLUCAN) 200 MG tablet Take 1 tablet (200 mg total) by mouth once for 1 dose. 11/04/23 11/04/23 Yes Arabella Merles, PA-C  acetaminophen (TYLENOL) 500 MG tablet Take 500-1,000 mg by mouth every 6 (six) hours as needed for moderate pain.    [provider]  albuterol (VENTOLIN HFA) 108 (90 Base) MCG/ACT inhaler Inhale 2 puffs into the lungs every 4 (four) hours as needed for wheezing or shortness of breath. 05/22/23   Pokhrel, Rebekah Chesterfield, MD  aspirin EC 81 MG tablet Take 1 tablet (81 mg total) by mouth daily. Swallow whole. 05/22/23   Pokhrel, Rebekah Chesterfield, MD  atorvastatin (LIPITOR) 80 MG tablet Take 1 tablet (80 mg total) by mouth daily. 09/20/23   Ronney Asters, NP  ibuprofen (ADVIL) 200 MG tablet Take 1 tablet (200 mg total) by mouth 2 (two) times daily as needed for moderate pain (pain). 05/22/23   Pokhrel, Rebekah Chesterfield, MD  metoprolol tartrate (LOPRESSOR) 25 MG tablet Take 1 tablet (25 mg total) by mouth 2 (two) times daily. 09/20/23   Ronney Asters, NP  rosuvastatin (CRESTOR) 40 MG tablet Take 1 tablet (40 mg total) by mouth  daily. 09/20/23 12/19/23  Ronney Asters, NP  ticagrelor (BRILINTA) 90 MG TABS tablet Take 1 tablet (90 mg total) by mouth 2 (two) times daily. 09/20/23   Ronney Asters, NP      Allergies    Iodinated contrast media, Penicillins, Bupropion, Clindamycin/lincomycin, Tetanus toxoids, Azithromycin, and Blue dyes (parenteral)    Review of Systems   Review of Systems  Constitutional:  Negative for fever.    Physical Exam Updated Vital Signs BP 129/78   Pulse (!) 115   Temp 97.8 F (36.6 C) (Oral)   Resp 17   Ht 5\' 3"  (1.6 m)   Wt 90.7 kg   LMP 09/24/2023 (Approximate)   SpO2 99%   BMI 35.43 kg/m  Physical Exam Vitals and nursing note reviewed.  Constitutional:      Appearance: Normal appearance.     Comments: Well-appearing, able to talk in full sentences, no difficulty breathing.  HENT:     Head: Atraumatic.     Mouth/Throat:     Comments: Patient's left upper lip is erythematous and edematous.  There is a scabbed over area where patient popped her pimple.  No drainage from the area. Cardiovascular:     Rate and Rhythm: Normal rate and regular rhythm.  Pulmonary:     Effort: Pulmonary effort is normal.  Neurological:     General: No focal deficit present.  Mental Status: She is alert.  Psychiatric:        Mood and Affect: Mood normal.        Behavior: Behavior normal.     ED Results / Procedures / Treatments   Labs (all labs ordered are listed, but only abnormal results are displayed) Labs Reviewed - No data to display  EKG None  Radiology No results found.  Procedures Procedures    Medications Ordered in ED Medications - No data to display  ED Course/ Medical Decision Making/ A&P                                 Medical Decision Making    Differential diagnosis includes but is not limited to cellulitis, abscess, dental infection  ED Course:  Patient with erythematous and edematous upper left lip.  She reports popping a pimple here 3 days  ago, and her lip has since become very swollen red and painful.  This appears like cellulitis of her upper lip.  Suspect infection from skin source, will treat with 7-day course of doxycycline.  Patient still able to swallow without difficulty, breathing comfortably.  Vital signs stable.  Appropriate for discharge home this time. Patient states she normally develops yeast infections after being started on antibiotics.  Her EKG was checked, no prolonged QT.  Will prescribe Diflucan to take if she develops yeast infection.   Impression: Upper left cellulitis  Disposition:  The patient was discharged home with instructions to take course of doxycycline as prescribed.  Warm compresses to the lip to help promote drainage.  Follow-up with PCP in the next 3 to 4 days for recheck.  Take Diflucan if you develop a yeast infection. Return precautions given.               Final Clinical Impression(s) / ED Diagnoses Final diagnoses:  Cellulitis of lip    Rx / DC Orders ED Discharge Orders          Ordered    doxycycline (VIBRAMYCIN) 100 MG capsule  2 times daily        11/04/23 1626    fluconazole (DIFLUCAN) 200 MG tablet   Once        11/04/23 1626              Arabella Merles, [place] 11/04/23 1630

## 2023-11-28 ENCOUNTER — Telehealth: Payer: Self-pay | Admitting: Internal Medicine

## 2023-11-28 ENCOUNTER — Telehealth: Payer: Self-pay | Admitting: *Deleted

## 2023-11-28 NOTE — Telephone Encounter (Signed)
Patient called and c/o of chest pain. Nitro taking times 2 with relief. Patient denies short of breath or chest pain at this time after taking Nitro. Advise patient if chest pain or sob resume to go to Emergency Room. Patient has an appt 11/30/23 @10 :55 am.  Patient verbalized an understanding.

## 2023-11-28 NOTE — Telephone Encounter (Signed)
Pt c/o of Chest Pain: STAT if active CP, including tightness, pressure, jaw pain, radiating pain to shoulder/upper arm/back, CP unrelieved by Nitro. Symptoms reported of SOB, nausea, vomiting, sweating.  1. Are you having CP right now? Yes   2. Are you experiencing any other symptoms (ex. SOB, nausea, vomiting, sweating)? SOB   3. Is your CP continuous or coming and going? Coming and going   4. Have you taken Nitroglycerin? Yes 1 tablet   5. How long have you been experiencing CP? 2-3 day ago, but now more frequent    6. If NO CP at time of call then end call with telling Pt to call back or call 911 if Chest pain returns prior to return call from triage team.

## 2023-11-28 NOTE — Telephone Encounter (Signed)
 Attempted to call patient, no answer left message requesting a call back.

## 2023-11-29 NOTE — Telephone Encounter (Signed)
Please see alternate 1/21 TE for documentation

## 2023-11-30 ENCOUNTER — Ambulatory Visit: Payer: Medicare HMO | Attending: Adult Health | Admitting: Adult Health

## 2023-11-30 ENCOUNTER — Encounter: Payer: Self-pay | Admitting: Adult Health

## 2023-11-30 VITALS — BP 138/82 | HR 91 | Ht 63.0 in | Wt 192.4 lb

## 2023-11-30 DIAGNOSIS — I259 Chronic ischemic heart disease, unspecified: Secondary | ICD-10-CM | POA: Diagnosis not present

## 2023-11-30 DIAGNOSIS — Z9861 Coronary angioplasty status: Secondary | ICD-10-CM

## 2023-11-30 DIAGNOSIS — I2583 Coronary atherosclerosis due to lipid rich plaque: Secondary | ICD-10-CM

## 2023-11-30 DIAGNOSIS — I2510 Atherosclerotic heart disease of native coronary artery without angina pectoris: Secondary | ICD-10-CM | POA: Diagnosis not present

## 2023-11-30 DIAGNOSIS — E7800 Pure hypercholesterolemia, unspecified: Secondary | ICD-10-CM

## 2023-11-30 DIAGNOSIS — Z720 Tobacco use: Secondary | ICD-10-CM | POA: Diagnosis not present

## 2023-11-30 MED ORDER — NITROGLYCERIN 0.4 MG SL SUBL: 0.4000 mg | SUBLINGUAL_TABLET | SUBLINGUAL | 3 refills | Status: DC | PRN

## 2023-11-30 MED ORDER — ISOSORBIDE MONONITRATE ER 30 MG PO TB24: 15.0000 mg | ORAL_TABLET | Freq: Every day | ORAL | 2 refills | Status: AC

## 2023-11-30 NOTE — Progress Notes (Signed)
Cardiology Office Note:  .   Date:  11/30/2023  ID:  Kathleen Marks, DOB 09-04-1971, MRN [id number] PCP: Greggory Keen  [facility] Providers Cardiologist:  [name], MD  }   History of Present Illness: .   Kathleen Marks is a 53 y.o. female of coronary artery disease, HLD, chronic systolic CHF, type 2 diabetes, tobacco abuse, asthma, and polysubstance abuse.   LHC 9/19 showed LAD 20%, proximal OM1 80%, CTO RCA, OM 2 99%, received PTCA/DES to proximal-mid OM2, LHC 9/21 mid LAD 30-40%, mid-distal LAD 20-30%, D1 50%, OM 2 100% in-stent restenosis, mid RCA 80-90%, PCI with DES to OM2.   LHC 7/24 D2 99%, mid LAD 90%, 40%, distal LAD 40%, OM2 was 20%, lateral OM 290%, proximal RCA CTO. Culprit lesion felt to be mid LAD. She received PCI with DES to mid LAD and balloon angioplasty to D2.   EF noted to be 30-35% 7/24, mild dyskinesis of left ventricle, apical anterior septal wall and anterior wall, G1 DD, normal RV function and no significant valvular abnormalities.. She reported smoking 1 pack of cigarettes per day. She denied cocaine use for 2 years.    When last seen by Edd Fabian, she reported that she has not taken her medications for 2 months. She also stopped taking her metoprolol and Brilinta. She has significant mental health issues and is working with mental health professionals.   She comes today with recurrent chest pain.  She has had to take nitroglycerin more often.  She states she notices this is her heart pain versus generalized pain.  She has been getting relief from the nitroglycerin.  She unfortunately continues to smoke.  She is also in need of a primary care provider as she has multiple issues with her skin and having mouth sores.  She has been medically compliant with Brilinta, aspirin, metoprolol, and atorvastatin.  Energy level is low.    ROS: As above otherwise negative.  Studies Reviewed: Marland Kitchen   LHC 05/19/2023     2nd Diag lesion  is 99% stenosed.   Mid LAD-1 lesion is 90% stenosed.   Mid LAD-2 lesion is 40% stenosed.   Dist LAD lesion is 40% stenosed.   2nd Mrg lesion is 20% stenosed.   Lat 2nd Mrg lesion is 90% stenosed.   Prox RCA lesion is 100% stenosed.   A drug-eluting stent was successfully placed using a SYNERGY [id number] [id number].   Balloon angioplasty was performed using a BALLN EMERGE MR [id number].   Post intervention, there is a 0% residual stenosis.   Post intervention, there is a 50% residual stenosis.   LV end diastolic pressure is normal.   Recommend uninterrupted dual antiplatelet therapy with Aspirin 81mg  daily and Ticagrelor 90mg  twice daily for a minimum of 12 months (ACS-Class I recommendation).   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 Subtotal second diagonal. Stents in OM2 are still patent. Chronic occlusion of a small nondominant RCA Normal LVEDP Successful PCI of the mid LAD with 2.75 x 16 mm Synergy stent post dilated to 3.0 Successful balloon angioplasty of the second diagonal   Plan: DAPT for one year. Aggressive risk factor modification. Guideline directed medical therapy for LV dysfunction.    EKG Interpretation Date/Time:  Thursday November 30 2023 11:02:15 EST Ventricular Rate:  91 PR Interval:  128 QRS Duration:  80 QT Interval:  360 QTC Calculation: 442 R Axis:   38  Text Interpretation: Normal sinus  rhythm Low voltage QRS Possible Inferior infarct , age undetermined When compared with ECG of 04-Nov-2023 16:12, PREVIOUS ECG IS PRESENT Confirmed by Kathleen Marks [phone] on 11/30/2023 12:06:59 PM    Physical Exam:   VS:  BP 138/82   Pulse 91   Ht 5\' 3"  (1.6 m)   Wt 192 lb 6.4 oz (87.3 kg)   LMP 09/24/2023 (Approximate)   SpO2 99%   BMI 34.08 kg/m    Wt Readings from Last 3 Encounters:  11/30/23 192 lb 6.4 oz (87.3 kg)  11/04/23 200 lb (90.7 kg)  09/20/23 197 lb (89.4 kg)    GEN: Well nourished, well developed in no acute distress NECK:  No JVD; No carotid bruits CARDIAC: RRR, no murmurs, rubs, gallops RESPIRATORY:  Clear to auscultation without rales, wheezing or rhonchi  ABDOMEN: Soft, non-tender, non-distended EXTREMITIES:  No edema; No deformity   ASSESSMENT AND PLAN: .   Coronary artery disease: Recent cardiac catheterization on 05/2023.  The patient required drug-eluting stents x  1 to the mid LAD, balloon angioplasty to a second diagonal lesion which was 99% stenosis reducing it to 50% stenosis.  OM 2 are patent.  She has chronic occlusion of small nondominant RCA.  She continues to be on aspirin, Brilinta, and has not skipped any doses since restarting a month ago.  She is having angina symptoms.  I am not certain if this was related to the nicotine when she smokes versus true angina from CAD.  The patient will be started on isosorbide 15 mg daily.  I am starting a low-dose that she is highly likely to have a headache from this.  I have advised her that she can take Tylenol with this medication until her body gets used to it.  She will continue other medications as directed.   I will order a cardiac PET scan to assess if she is having some perfusion abnormalities with recently placed stents.   2.  Hypercholesterolemia: She remains on atorvastatin 40 mg daily.  Goal of LDL less than 70.  This will need to be checked in the next 3 months for follow-up evaluation.  3.  Ongoing tobacco abuse: She continues to smoke a pack a day.  I have talked with her about this, she is aware she needs to stop but is not ready to do so at this time.  She is aware of the cardiovascular risk of ongoing smoking.       [place]  Signed, Bettey Mare. Liborio Nixon, ANP, AACC

## 2023-11-30 NOTE — Patient Instructions (Signed)
Medication Instructions:  Start Imdur 30 mg ( Take 1/2 Tablet 15 mg Daily). Nitroglycerin 0.4 mg ( Take As Directed). *If you need a refill on your cardiac medications before your next appointment, please call your pharmacy*   Lab Work: No Labs If you have labs (blood work) drawn today and your tests are completely normal, you will receive your results only by: MyChart Message (if you have MyChart) OR A paper copy in the mail If you have any lab test that is abnormal or we need to change your treatment, we will call you to review the results.   Testing/Procedures:    Please report to Radiology at the [facility] Main Entrance 30 minutes early for your test.  [address] [place], [place] [address]                         OR   Please report to Radiology at [facility] Main Entrance, medical mall, 30 mins prior to your test.  [address]  [place], [place]  How to Prepare for Your Cardiac PET/CT Stress Test:  Nothing to eat or drink, except water, 3 hours prior to arrival time.  NO caffeine/decaffeinated products, or chocolate 12 hours prior to arrival. (Please note decaffeinated beverages (teas/coffees) still contain caffeine).  If you have caffeine within 12 hours prior, the test will need to be rescheduled.  Medication instructions: Do not take erectile dysfunction medications for 72 hours prior to test (sildenafil, tadalafil) Do not take nitrates (isosorbide mononitrate, Ranexa) the day before or day of test Do not take tamsulosin the day before or morning of test Hold theophylline containing medications for 12 hours. Hold Dipyridamole 48 hours prior to the test.  Diabetic Preparation: If able to eat breakfast prior to 3 hour fasting, you may take all medications, including your insulin. Do not worry if you miss your breakfast dose of insulin - start at your next meal. If you do not eat prior to 3 hour fast-Hold all diabetes (oral  and insulin) medications. Patients who wear a continuous glucose monitor MUST remove the device prior to scanning.  You may take your remaining medications with water.  NO perfume, cologne or lotion on chest or abdomen area. FEMALES - Please avoid wearing dresses to this appointment.  Total time is 1 to 2 hours; you may want to bring reading material for the waiting time.  IF YOU THINK YOU MAY BE PREGNANT, OR ARE NURSING PLEASE INFORM THE TECHNOLOGIST.  In preparation for your appointment, medication and supplies will be purchased.  Appointment availability is limited, so if you need to cancel or reschedule, please call the Radiology Department Scheduler at [phone] 24 hours in advance to avoid a cancellation fee of $100.00  What to Expect When you Arrive:  Once you arrive and check in for your appointment, you will be taken to a preparation room within the Radiology Department.  A technologist or Nurse will obtain your medical history, verify that you are correctly prepped for the exam, and explain the procedure.  Afterwards, an IV will be started in your arm and electrodes will be placed on your skin for EKG monitoring during the stress portion of the exam. Then you will be escorted to the PET/CT scanner.  There, staff will get you positioned on the scanner and obtain a blood pressure and EKG.  During the exam, you will continue to be connected to the EKG and  blood pressure machines.  A small, safe amount of a radioactive tracer will be injected in your IV to obtain a series of pictures of your heart along with an injection of a stress agent.    After your Exam:  It is recommended that you eat a meal and drink a caffeinated beverage to counter act any effects of the stress agent.  Drink plenty of fluids for the remainder of the day and urinate frequently for the first couple of hours after the exam.  Your doctor will inform you of your test results within 7-10 business days.  For more  information and frequently asked questions, please visit our website: [email or web address]  For questions about your test or how to prepare for your test, please call: Cardiac Imaging Nurse Navigators Office: [phone]    Follow-Up: At [facility], you and your health needs are our priority.  As part of our continuing mission to provide you with exceptional heart care, we have created designated Provider Care Teams.  These Care Teams include your primary Cardiologist (physician) and Advanced Practice Providers (APPs -  Physician Assistants and Nurse Practitioners) who all work together to provide you with the care you need, when you need it.  We recommend signing up for the patient portal called "MyChart".  Sign up information is provided on this After Visit Summary.  MyChart is used to connect with patients for Virtual Visits (Telemedicine).  Patients are able to view lab/test results, encounter notes, upcoming appointments, etc.  Non-urgent messages can be sent to your provider as well.   To learn more about what you can do with MyChart, go to [email or web address].    Your next appointment:   6 week(s)  Provider:   Joni Reining, DNP, ANP    or, [name], MD

## 2023-12-20 ENCOUNTER — Telehealth: Payer: Self-pay | Admitting: Internal Medicine

## 2023-12-20 ENCOUNTER — Other Ambulatory Visit: Payer: Self-pay

## 2023-12-20 MED ORDER — ROSUVASTATIN CALCIUM 40 MG PO TABS: 40.0000 mg | ORAL_TABLET | Freq: Every day | ORAL | 2 refills | Status: DC

## 2023-12-20 NOTE — Telephone Encounter (Signed)
Pt c/o medication issue:  1. Name of Medication:  rosuvastatin (CRESTOR) 40 MG tablet (Expired)  atorvastatin (LIPITOR) 80 MG tablet   2. How are you currently taking this medication (dosage and times per day)?   3. Are you having a reaction (difficulty breathing--STAT)?   4. What is your medication issue?   Caller Santina Evans) wants confirmation on which medication patient should be taking.

## 2023-12-20 NOTE — Telephone Encounter (Signed)
Patient returned my phone call to clarify which statin she is taking.  Oris Drone started her on resuvastatin at her last office visit. She states that she is taking only the resuvastatin and has stopped taking the lipitor. New refill sent to pharmacy for the crestor.

## 2023-12-20 NOTE — Telephone Encounter (Signed)
Chart reviewed and unclear what dose patient is actually taking and which statin. Left VMM for her to call back to let us know which statin she is on and what dose.

## 2024-01-05 NOTE — Progress Notes (Unsigned)
 Cardiology Clinic Note   Patient Name: Kathleen Marks Date of Encounter: 01/08/2024  Primary Care Provider:  Glenna Durand, PA-C Primary Cardiologist:  Kathleen Nose, MD  Patient Profile    Kathleen Marks 54 year old female presents the clinic today for follow-up evaluation of her coronary artery disease and hyperlipidemia.  Past Medical History    Past Medical History:  Diagnosis Date   Asthma    Diabetes mellitus without complication (HCC)    Hyperlipemia    Past Surgical History:  Procedure Laterality Date   ARTHROSCOPIC REPAIR ACL     CORONARY STENT INTERVENTION N/A 05/19/2023   Procedure: CORONARY STENT INTERVENTION;  Surgeon: Marks, Kathleen M, MD;  Location: [facility] INVASIVE CV LAB;  Service: Cardiovascular;  Laterality: N/A;   FEMUR SURGERY     LEFT HEART CATH AND CORONARY ANGIOGRAPHY N/A 05/19/2023   Procedure: LEFT HEART CATH AND CORONARY ANGIOGRAPHY;  Surgeon: Marks, Kathleen M, MD;  Location: [facility] INVASIVE CV LAB;  Service: Cardiovascular;  Laterality: N/A;    Allergies  Allergies  Allergen Reactions   Iodinated Contrast Media Shortness Of Breath and Anaphylaxis   Penicillins Hives   Bupropion Itching    Pt states she has psychological reaction to generic Wellbutrin   Clindamycin/Lincomycin Nausea And Vomiting   Tetanus Toxoids Itching and Other (See Comments)    Pain/knots in arms    Azithromycin Nausea And Vomiting and Nausea Only    States she can tolerate.    Blue Dyes (Parenteral) Rash    History of Present Illness    Kathleen Marks has a PMH of coronary artery disease, HLD, chronic systolic CHF, type 2 diabetes, tobacco abuse, asthma, and polysubstance abuse.  LHC 9/19 showed LAD 20%, proximal OM1 80%, CTO RCA, OM 2 99%, received PTCA/DES to proximal-mid OM2, LHC 9/21 mid LAD 30-40%, mid-distal LAD 20-30%, D1 50%, OM 2 100% in-stent restenosis, mid RCA 80-90%, PCI with DES to OM2.  LHC 7/24 D2 99%, mid LAD 90%, 40%, distal LAD  40%, OM2 was 20%, lateral OM 290%, proximal RCA CTO.  Culprit lesion felt to be mid LAD.  She received PCI with DES to mid LAD and balloon angioplasty to D2.  EF noted to be 30-35% 7/24, mild dyskinesis of left ventricle, apical anterior septal wall and anterior wall, G1 DD, normal RV function and no significant valvular abnormalities.  She was seen in follow-up by Kathleen Levering NP-C on 06/20/2023.  She had been previously evaluated by Dr. Rennis Marks 7/24 for chest pain during hospitalization.  She had presented to the emergency department 05/15/2023 with complaints of left-sided chest discomfort radiating to her jaw and shortness of breath.  Her troponins were noted to be 16-80.  Her blood sugar was greater than 300.  Her triglycerides were greater than 600.  She reported smoking 1 pack of cigarettes per day.  She denied cocaine use for 2 years.  Her EKG showed atrial fibrillation in the emergency department.  EKG was reviewed and appeared to be sinus tachycardia with possible atrial tachycardia.  Additional troponin were ordered.  Patient refused insulin.  Echocardiogram showed moderate LV dysfunction and significant LAD wall motion territory abnormality.  A repeat EKG showed 1-2 mm anterior ST elevation.  She wished to defer heart catheterization and left AMA in order to take care of her son with MR.  She returned to the emergency department 05/17/2023 to undergo LHC.  She continued intermittent chest discomfort and described a light grabbing type sensation.  Repeat troponins were drawn and were noted to be 573-620.  She underwent LHC with PCI and DES to her mid LAD and balloon angioplasty to D2.  She was scheduled for discharge on 05/22/2023.  However the evening of 05/21/2023 she signed out AMA.  She presented back to the emergency department 05/21/2023 to get IV antibiotics for cellulitis on her face.  She was given vancomycin.  At the time of ED intake she expressed concern over being able to afford Brilinta.  She  was discharged on 05/22/2023.  She was seen in follow-up 06/20/2023.  During that time she reports continued episodes of a clinching type feeling.  She reported this was on the left side of her chest and would last for 30-60 minutes and resolve on its own.  She reported that it felt like a rippling across her chest from left to right.  She reported the sensation was happening more frequently.  It would occur with exertion.  She became very tearful and expressed her frustration over her treatment at the hospital.  She stated that she was not trying to be difficult but, needed to care for her son.  She felt that she was not being listened to her or allow to advocate for herself.  She reported a sensitivity to medications.  She has been able to arrange and limited transportation through [facility] which should help with her stress.  She continues to smoke 1 pack/day.  She was interested in quitting smoking.  She expressed interest in participating in cardiac rehab.  She presented to the clinic 09/20/23 for follow-up evaluation and stated she stopped taking her medications about 2 months ago.  She reported that no one would refill her medications.  She felt that atorvastatin increased her blood sugar.  She reported that she did not have trouble with her blood sugar until taking the medication.  She also stopped taking her metoprolol and Brilinta.  She noted activity intolerance.  She did report being very physically active.  She continued to notice a grabbing sharp type pain daily.  She was tearful in the exam room.  I reviewed her cardiac catheterization and importance of medication compliance.  She expressed understanding.  She reported that she continued to work with mental health professionals.  I planned follow-up in 1-2 months.   She was seen in follow-up on 11/30/2023 by Kathleen Reining, DNP.  During that time she presented with recurrent chest pain.  She noted that she was taking nitroglycerin more often.   She felt that her pain was related to heart pain versus generalized pain.  She noted relief from nitroglycerin.  She continued to smoke.  She was also in need of primary care for multiple issues with her skin and mouth sores.  She reported compliance with her aspirin, Brilinta, metoprolol, and atorvastatin.  She noted low energy.  Cardiac PET CT was ordered but has not yet been completed.  She presents to the clinic today for evaluation and states she was in the emergency department for chest discomfort 01/06/2024.  She was seen and evaluated at [facility].  We reviewed her emergency department visit and her diagnostic test.  Her cardiac troponins were reassuring.  Her chest x-ray showed no acute processes.  Her lab work showed hyponatremia.  We reviewed the importance of reducing fluid consumption.  She expressed understanding.  I also reviewed her previous clinic visit.  She expressed understanding.  She does feel that she is  doing better with Imdur therapy.  She continues to note intermittent episodes of sharp sticking type pain through her chest.  She reports that she was never called about her cardiac PET/CT.  I will go ahead and have her schedule this.  I will refill her nitroglycerin, have her continue her physical activity, and plan follow-up after cardiac PET/CT.  Today she denies shortness of breath, lower extremity edema, fatigue, palpitations, melena, hematuria, hemoptysis, diaphoresis, weakness, presyncope, syncope, orthopnea, and PND.      Home Medications    Prior to Admission medications   Medication Sig Start Date End Date Taking? Authorizing Provider  acetaminophen (TYLENOL) 500 MG tablet Take 500-1,000 mg by mouth every 6 (six) hours as needed for moderate pain.    [provider]  albuterol (VENTOLIN HFA) 108 (90 Base) MCG/ACT inhaler Inhale 2 puffs into the lungs every 4 (four) hours as needed for wheezing or shortness of  breath. 05/22/23   Pokhrel, Rebekah Chesterfield, MD  aspirin EC 81 MG tablet Take 1 tablet (81 mg total) by mouth daily. Swallow whole. 05/22/23   Pokhrel, Rebekah Chesterfield, MD  atorvastatin (LIPITOR) 80 MG tablet Take 1 tablet (80 mg total) by mouth daily. 05/22/23   Pokhrel, Rebekah Chesterfield, MD  fluconazole (DIFLUCAN) 200 MG tablet Take 200 mg by mouth 2 (two) times daily. Patient not taking: Reported on 07/05/2023 06/06/23   [provider]  ibuprofen (ADVIL) 200 MG tablet Take 1 tablet (200 mg total) by mouth 2 (two) times daily as needed for moderate pain (pain). 05/22/23   Pokhrel, Rebekah Chesterfield, MD  metFORMIN (GLUCOPHAGE-XR) 500 MG 24 hr tablet Take 1 tablet (500 mg total) by mouth daily with breakfast. 07/05/23   Hilty, Lisette Abu, MD  metoprolol tartrate (LOPRESSOR) 25 MG tablet Take 1 tablet (25 mg total) by mouth 2 (two) times daily. 05/22/23   Pokhrel, Rebekah Chesterfield, MD  oxyCODONE-acetaminophen (PERCOCET/ROXICET) 5-325 MG tablet Take 1 tablet by mouth every 6 (six) hours as needed for severe pain. 08/11/23   Glendale Chard, DO  ticagrelor (BRILINTA) 90 MG TABS tablet Take 1 tablet (90 mg total) by mouth 2 (two) times daily. 05/22/23   Pokhrel, Rebekah Chesterfield, MD    Family History    No family history on file. has no family status information on file.   Social History    Social History   Socioeconomic History   Marital status: Divorced    Spouse name: Not on file   Number of children: Not on file   Years of education: Not on file   Highest education level: Not on file  Occupational History   Not on file  Tobacco Use   Smoking status: Every Day    Current packs/day: 0.50    Types: Cigarettes   Smokeless tobacco: Not on file  Substance and Sexual Activity   Alcohol use: No   Drug use: No   Sexual activity: Not on file  Other Topics Concern   Not on file  Social History Narrative   Not on file   Social Drivers of Health   Financial Resource Strain: Medium Risk (01/04/2023)   Received from [facility], [facility]    Overall Financial Resource Strain (CARDIA)    Difficulty of Paying Living Expenses: Somewhat hard  Food Insecurity: No Food Insecurity (05/18/2023)   Hunger Vital Sign    Worried About Running Out of Food in the Last Year: Never true    Ran Out of Food in the Last Year: Never true  Recent Concern: Food Insecurity -  Food Insecurity Present (05/16/2023)   Hunger Vital Sign    Worried About Running Out of Food in the Last Year: Sometimes true    Ran Out of Food in the Last Year: Sometimes true  Transportation Needs: Unmet Transportation Needs (05/18/2023)   PRAPARE - Administrator, Civil Service (Medical): Yes    Lack of Transportation (Non-Medical): Yes  Physical Activity: Unknown (01/04/2023)   Received from [facility], [facility]   Exercise Vital Sign    Days of Exercise per Week: 0 days    Minutes of Exercise per Session: Not on file  Stress: Stress Concern Present (01/04/2023)   Received from [facility], [facility] - Occupational Stress Questionnaire    Feeling of Stress : Rather much  Social Connections: Socially Isolated (01/04/2023)   Received from [facility], [facility]   Social Network    How would you rate your social network (family, work, friends)?: Little participation, lonely and socially isolated  Intimate Partner Violence: Not At Risk (05/18/2023)   Humiliation, Afraid, Rape, and Kick questionnaire    Fear of Current or Ex-Partner: No    Emotionally Abused: No    Physically Abused: No    Sexually Abused: No     Review of Systems    General:  No chills, fever, night sweats or weight changes.  Cardiovascular:  No chest pain, dyspnea on exertion, edema, orthopnea, palpitations, paroxysmal nocturnal dyspnea. Dermatological: No rash, lesions/masses Respiratory: No cough, dyspnea Urologic: No hematuria, dysuria Abdominal:   No nausea, vomiting, diarrhea, bright red blood per rectum, melena, or  hematemesis Neurologic:  No visual changes, wkns, changes in mental status. All other systems reviewed and are otherwise negative except as noted above.  Physical Exam    VS:  BP 106/70 (BP Location: Right Arm, Patient Position: Sitting, Cuff Size: Normal)   Pulse 92   Ht 5\' 3"  (1.6 m)   Wt 190 lb 6.4 oz (86.4 kg)   SpO2 97%   BMI 33.73 kg/m  , BMI Body mass index is 33.73 kg/m. GEN: Well nourished, well developed, in no acute distress. HEENT: normal. Neck: Supple, no JVD, carotid bruits, or masses. Cardiac: RRR, no murmurs, rubs, or gallops. No clubbing, cyanosis, edema.  Radials/DP/PT 2+ and equal bilaterally.  Chest pain Respiratory:  Respirations regular and unlabored, clear to auscultation bilaterally. GI: Soft, nontender, nondistended, BS + x 4. MS: no deformity or atrophy. Skin: warm and dry, no rash. Neuro:  Strength and sensation are intact. Psych: Normal affect.  Accessory Clinical Findings    Recent Labs: 05/16/2023: TSH 0.774 05/21/2023: ALT 33 05/22/2023: BUN 14; Creatinine, Ser 0.68; Hemoglobin 13.6; Magnesium 1.5; Platelets 279; Potassium 3.6; Sodium 133   Recent Lipid Panel    Component Value Date/Time   CHOL 273 (H) 05/16/2023 0622   TRIG 645 (H) 05/16/2023 0622   HDL 32 (L) 05/16/2023 0622   CHOLHDL 8.5 05/16/2023 0622   VLDL UNABLE TO CALCULATE IF TRIGLYCERIDE OVER 400 mg/dL 09/81/1914 7829   LDLCALC UNABLE TO CALCULATE IF TRIGLYCERIDE OVER 400 mg/dL 56/21/3086 5784   LDLDIRECT 165 (H) 05/16/2023 0622         ECG personally reviewed by me today-none today.   LHC 05/19/2023    2nd Diag lesion is 99% stenosed.   Mid LAD-1 lesion is 90% stenosed.   Mid LAD-2 lesion is 40% stenosed.   Dist LAD lesion is 40% stenosed.   2nd Mrg lesion is 20%  stenosed.   Lat 2nd Mrg lesion is 90% stenosed.   Prox RCA lesion is 100% stenosed.   A drug-eluting stent was successfully placed using a SYNERGY XD [id number].   Balloon angioplasty was performed using a BALLN  EMERGE MR [id number].   Post intervention, there is a 0% residual stenosis.   Post intervention, there is a 50% residual stenosis.   LV end diastolic pressure is normal.   Recommend uninterrupted dual antiplatelet therapy with Aspirin 81mg  daily and Ticagrelor 90mg  twice daily for a minimum of 12 months (ACS-Class I recommendation).   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 Subtotal second diagonal. Stents in OM2 are still patent. Chronic occlusion of a small nondominant RCA Normal LVEDP Successful PCI of the mid LAD with 2.75 x 16 mm Synergy stent post dilated to 3.0 Successful balloon angioplasty of the second diagonal   Plan: DAPT for one year. Aggressive risk factor modification. Guideline directed medical therapy for LV dysfunction.  Echocardiogram 05/16/2023  IMPRESSIONS     1. There is no left ventricular thrombus (Definity contrast was used),  but there is apical swirling/slow flow. Left ventricular ejection  fraction, by estimation, is 30 to 35%. The left ventricle has moderately  decreased function. The left ventricle  demonstrates regional wall motion abnormalities (see scoring  diagram/findings for description). Left ventricular diastolic parameters  are consistent with Grade I diastolic dysfunction (impaired relaxation).  There is mild dyskinesis of the left  ventricular, apical anteroseptal wall and anterior wall. There is severe  hypokinesis of the left ventricular, mid-apical anteroseptal wall and  anterior wall.   2. Right ventricular systolic function is normal. The right ventricular  size is normal. Tricuspid regurgitation signal is inadequate for assessing  PA pressure.   3. The mitral valve is normal in structure. No evidence of mitral valve  regurgitation.   4. The aortic valve is tricuspid. Aortic valve regurgitation is not  visualized.   5. The inferior vena cava is normal in size with greater than 50%  respiratory variability,  suggesting right atrial pressure of 3 mmHg.   FINDINGS   Left Ventricle: There is no left ventricular thrombus (Definity contrast  was used), but there is apical swirling/slow flow. Left ventricular  ejection fraction, by estimation, is 30 to 35%. The left ventricle has  moderately decreased function. The left  ventricle demonstrates regional wall motion abnormalities. Mild dyskinesis  of the left ventricular, apical anteroseptal wall and anterior wall.  Severe hypokinesis of the left ventricular, mid-apical anteroseptal wall  and anterior wall. Definity contrast  agent was given IV to delineate the left ventricular endocardial borders.  The left ventricular internal cavity size was normal in size. There is no  left ventricular hypertrophy. Left ventricular diastolic parameters are  consistent with Grade I diastolic  dysfunction (impaired relaxation). Normal left ventricular filling  pressure.     LV Wall Scoring:  The apical septal segment, apical anterior segment, and apex are  dyskinetic.  The mid anteroseptal segment, apical lateral segment, mid anterior  segment,  and apical inferior segment are hypokinetic. The antero-lateral wall,  inferior wall, posterior wall, basal anteroseptal segment, mid  inferoseptal  segment, basal anterior segment, and basal inferoseptal segment are  normal.   Right Ventricle: The right ventricular size is normal. No increase in  right ventricular wall thickness. Right ventricular systolic function is  normal. Tricuspid regurgitation signal is inadequate for assessing PA  pressure.   Left Atrium: Left atrial size  was normal in size.   Right Atrium: Right atrial size was normal in size.   Pericardium: There is no evidence of pericardial effusion.   Mitral Valve: The mitral valve is normal in structure. No evidence of  mitral valve regurgitation.   Tricuspid Valve: The tricuspid valve is normal in structure. Tricuspid  valve regurgitation is  not demonstrated.   Aortic Valve: The aortic valve is tricuspid. Aortic valve regurgitation is  not visualized. Aortic valve mean gradient measures 4.0 mmHg. Aortic valve  peak gradient measures 6.0 mmHg. Aortic valve area, by VTI measures 2.28  cm.   Pulmonic Valve: The pulmonic valve was not well visualized. Pulmonic valve  regurgitation is not visualized. No evidence of pulmonic stenosis.   Aorta: The aortic root is normal in size and structure.   Venous: The inferior vena cava is normal in size with greater than 50%  respiratory variability, suggesting right atrial pressure of 3 mmHg.   IAS/Shunts: No atrial level shunt detected by color flow Doppler.        Assessment & Plan   1. Coronary artery disease-reports chest discomfort and presented to the emergency department 01/06/2024.  Her cardiac enzymes were reassuring.  Chest x-ray unremarkable.  Her lab work showed hyponatremia.  I educated her about drinking less water.  Continues to note intermittent periods of chest discomfort.  STEMI 7/24 with PCI and DES to mid LAD with angioplasty to D2.    Continue  aspirin,  nitroglycerin as needed Continue Brilinta, continue isosorbide, rosuvastatin Heart healthy low-sodium diet Increase physical activity as tolerated Restart cardiac rehab Proceed to cardiac PET CT  HLD-reviewed high fiber diet. Reports that atorvastatin was making her blood sugar elevated High-fiber diet Reorder Crestor 40 mg daily Increase physical activity as tolerated Plan for repeat fasting lipids and LFTs in 4 to 6 weeks  Chronic systolic CHF-weight today 190.4.  Continues to be euvolemic.  7/24 echocardiogram showed an EF of 30-35% and G1 DD.  She was previously hesitant to add GDMT. Continue metoprolol Daily weights Heart healthy low-sodium diet Elevate lower extremities when not active  Tobacco abuse- Now smoking 1 pack per day. Smoking cessation strongly encouraged   Disposition: Follow-up with  Dr. Rennis Marks or me in 1-2 months.   Kathleen Marks. Kathleen Carden NP-C     01/08/2024, 5:07 PM [facility] [address] Office [phone] Fax [phone]    I spent 14 minutes examining this patient, reviewing medications, and using patient centered shared decision making involving her cardiac care.   I spent  20 minutes reviewing her past medical history,  medications, and prior cardiac tests.

## 2024-01-08 ENCOUNTER — Ambulatory Visit: Payer: Medicare HMO | Admitting: Adult Health

## 2024-01-08 ENCOUNTER — Encounter: Payer: Self-pay | Admitting: General Practice

## 2024-01-08 ENCOUNTER — Ambulatory Visit: Payer: Medicare HMO | Attending: General Practice | Admitting: General Practice

## 2024-01-08 VITALS — BP 106/70 | HR 92 | Ht 63.0 in | Wt 190.4 lb

## 2024-01-08 DIAGNOSIS — I2583 Coronary atherosclerosis due to lipid rich plaque: Secondary | ICD-10-CM | POA: Diagnosis not present

## 2024-01-08 DIAGNOSIS — Z720 Tobacco use: Secondary | ICD-10-CM

## 2024-01-08 DIAGNOSIS — I5022 Chronic systolic (congestive) heart failure: Secondary | ICD-10-CM | POA: Diagnosis not present

## 2024-01-08 DIAGNOSIS — I2510 Atherosclerotic heart disease of native coronary artery without angina pectoris: Secondary | ICD-10-CM

## 2024-01-08 DIAGNOSIS — E7800 Pure hypercholesterolemia, unspecified: Secondary | ICD-10-CM

## 2024-01-08 MED ORDER — NITROGLYCERIN 0.4 MG SL SUBL: 0.4000 mg | SUBLINGUAL_TABLET | SUBLINGUAL | 3 refills | Status: AC | PRN

## 2024-01-08 MED ORDER — ROSUVASTATIN CALCIUM 40 MG PO TABS: 40.0000 mg | ORAL_TABLET | Freq: Every day | ORAL | 2 refills | Status: AC

## 2024-01-08 NOTE — Patient Instructions (Signed)
 Medication Instructions:  The current medical regimen is effective;  continue present plan and medications as directed. Please refer to the Current Medication list given to you today.  *If you need a refill on your cardiac medications before your next appointment, please call your pharmacy*   Lab Work: CBC AND BMET 3-7 DAYS BEFORE PET SCAN If you have labs (blood work) drawn today and your tests are completely normal, you will receive your results only by:  MyChart Message (if you have MyChart) OR A paper copy in the mail If you have any lab test that is abnormal or we need to change your treatment, we will call you to review the results.  Testing/Procedures: SCHEDULE PET SCAN  Follow-Up: At [facility], you and your health needs are our priority.  As part of our continuing mission to provide you with exceptional heart care, we have created designated Provider Care Teams.  These Care Teams include your primary Cardiologist (physician) and Advanced Practice Providers (APPs -  Physician Assistants and Nurse Practitioners) who all work together to provide you with the care you need, when you need it.  Your next appointment:   1-2 month(s)  Provider:   Edd Fabian, FNP-C   Other Instructions Steps to Quit Smoking Smoking tobacco is the leading cause of preventable death. It can affect almost every organ in the body. Smoking puts you and people around you at risk for many serious, long-lasting (chronic) diseases. Quitting smoking can be hard, but it is one of the best things that you can do for your health. It is never too late to quit. Do not give up if you cannot quit the first time. Some people need to try many times to quit. Do your best to stick to your quit plan, and talk with your doctor if you have any questions or concerns. How do I get ready to quit? Pick a date to quit. Set a date within the next 2 weeks to give you time to prepare. Write down the reasons why you are  quitting. Keep this list in places where you will see it often. Tell your family, friends, and co-workers that you are quitting. Their support is important. Talk with your doctor about the choices that may help you quit. Find out if your health insurance will pay for these treatments. Know the people, places, things, and activities that make you want to smoke (triggers). Avoid them. What first steps can I take to quit smoking? Throw away all cigarettes at home, at work, and in your car. Throw away the things that you use when you smoke, such as ashtrays and lighters. Clean your car. Empty the ashtray. Clean your home, including curtains and carpets. What can I do to help me quit smoking? Talk with your doctor about taking medicines and seeing a counselor. You are more likely to succeed when you do both. If you are pregnant or breastfeeding: Talk with your doctor about counseling or other ways to quit smoking. Do not take medicine to help you quit smoking unless your doctor tells you to. Quit right away Quit smoking completely, instead of slowly cutting back on how much you smoke over a period of time. Stopping smoking right away may be more successful than slowly quitting. Go to counseling. In-person is best if this is an option. You are more likely to quit if you go to counseling sessions regularly. Take medicine You may take medicines to help you quit. Some medicines need a prescription,  and some you can buy over-the-counter. Some medicines may contain a drug called nicotine to replace the nicotine in cigarettes. Medicines may: Help you stop having the desire to smoke (cravings). Help to stop the problems that come when you stop smoking (withdrawal symptoms). Your doctor may ask you to use: Nicotine patches, gum, or lozenges. Nicotine inhalers or sprays. Non-nicotine medicine that you take by mouth. Find resources Find resources and other ways to help you quit smoking and remain  smoke-free after you quit. They include: Online chats with a Veterinary surgeon. Phone quitlines. Printed Materials engineer. Support groups or group counseling. Text messaging programs. Mobile phone apps. Use apps on your mobile phone or tablet that can help you stick to your quit plan. Examples of free services include Quit Guide from the CDC and smokefree.gov  What can I do to make it easier to quit?  Talk to your family and friends. Ask them to support and encourage you. Call a phone quitline, such as 1-800-QUIT-NOW, reach out to support groups, or work with a Veterinary surgeon. Ask people who smoke to not smoke around you. Avoid places that make you want to smoke, such as: Bars. Parties. Smoke-break areas at work. Spend time with people who do not smoke. Lower the stress in your life. Stress can make you want to smoke. Try these things to lower stress: Getting regular exercise. Doing deep-breathing exercises. Doing yoga. Meditating. What benefits will I see if I quit smoking? Over time, you may have: A better sense of smell and taste. Less coughing and sore throat. A slower heart rate. Lower blood pressure. Clearer skin. Better breathing. Fewer sick days. Summary Quitting smoking can be hard, but it is one of the best things that you can do for your health. Do not give up if you cannot quit the first time. Some people need to try many times to quit. When you decide to quit smoking, make a plan to help you succeed. Quit smoking right away, not slowly over a period of time. When you start quitting, get help and support to keep you smoke-free. This information is not intended to replace advice given to you by your health care provider. Make sure you discuss any questions you have with your health care provider. Document Revised: 10/15/2021 Document Reviewed: 10/15/2021 Elsevier Patient Education  2024 [employer].

## 2024-03-15 ENCOUNTER — Other Ambulatory Visit: Payer: Self-pay | Admitting: General Practice

## 2024-03-15 ENCOUNTER — Encounter (HOSPITAL_COMMUNITY): Payer: Self-pay

## 2024-03-18 ENCOUNTER — Telehealth (HOSPITAL_COMMUNITY): Payer: Self-pay | Admitting: *Deleted

## 2024-03-18 NOTE — Telephone Encounter (Signed)
 Reaching out to patient to offer assistance regarding upcoming cardiac imaging study; pt request to cancel test d/t lack of transportation. Kerri Peed RN Navigator Cardiac Imaging [facility] [phone] office [phone] cell

## 2024-03-19 ENCOUNTER — Ambulatory Visit (HOSPITAL_COMMUNITY): Payer: Medicare HMO

## 2024-04-02 NOTE — Progress Notes (Deleted)
 Cardiology Clinic Note   Patient Name: Kathleen Marks Date of Encounter: 04/02/2024  Primary Care Provider:  Millicent Richerd LABOR, PA-C Primary Cardiologist:  Vinie JAYSON Maxcy, MD  Patient Profile    Kathleen Marks 53 year old female presents the clinic today for follow-up evaluation of her coronary artery disease and hyperlipidemia.  Past Medical History    Past Medical History:  Diagnosis Date   Asthma    Diabetes mellitus without complication (HCC)    Hyperlipemia    Past Surgical History:  Procedure Laterality Date   ARTHROSCOPIC REPAIR ACL     CORONARY STENT INTERVENTION N/A 05/19/2023   Procedure: CORONARY STENT INTERVENTION;  Surgeon: Swaziland, Peter M, MD;  Location: [facility] INVASIVE CV LAB;  Service: Cardiovascular;  Laterality: N/A;   FEMUR SURGERY     LEFT HEART CATH AND CORONARY ANGIOGRAPHY N/A 05/19/2023   Procedure: LEFT HEART CATH AND CORONARY ANGIOGRAPHY;  Surgeon: Swaziland, Peter M, MD;  Location: [facility] INVASIVE CV LAB;  Service: Cardiovascular;  Laterality: N/A;    Allergies  Allergies  Allergen Reactions   Iodinated Contrast Media Shortness Of Breath and Anaphylaxis   Penicillins Hives   Bupropion Itching    Pt states she has psychological reaction to generic Wellbutrin   Clindamycin/Lincomycin Nausea And Vomiting   Tetanus Toxoids Itching and Other (See Comments)    Pain/knots in arms    Azithromycin  Nausea And Vomiting and Nausea Only    States she can tolerate.    Blue Dyes (Parenteral) Rash    History of Present Illness    Kathleen Marks has a PMH of coronary artery disease, HLD, chronic systolic CHF, type 2 diabetes, tobacco abuse, asthma, and polysubstance abuse.  LHC 9/19 showed LAD 20%, proximal OM1 80%, CTO RCA, OM 2 99%, received PTCA/DES to proximal-mid OM2, LHC 9/21 mid LAD 30-40%, mid-distal LAD 20-30%, D1 50%, OM 2 100% in-stent restenosis, mid RCA 80-90%, PCI with DES to OM2.  LHC 7/24 D2 99%, mid LAD 90%, 40%, distal  LAD 40%, OM2 was 20%, lateral OM 290%, proximal RCA CTO.  Culprit lesion felt to be mid LAD.  She received PCI with DES to mid LAD and balloon angioplasty to D2.  EF noted to be 30-35% 7/24, mild dyskinesis of left ventricle, apical anterior septal wall and anterior wall, G1 DD, normal RV function and no significant valvular abnormalities.  She was seen in follow-up by Barnie Hila NP-C on 06/20/2023.  She had been previously evaluated by Dr. Maxcy 7/24 for chest pain during hospitalization.  She had presented to the emergency department 05/15/2023 with complaints of left-sided chest discomfort radiating to her jaw and shortness of breath.  Her troponins were noted to be 16-80.  Her blood sugar was greater than 300.  Her triglycerides were greater than 600.  She reported smoking 1 pack of cigarettes per day.  She denied cocaine use for 2 years.  Her EKG showed atrial fibrillation in the emergency department.  EKG was reviewed and appeared to be sinus tachycardia with possible atrial tachycardia.  Additional troponin were ordered.  Patient refused insulin .  Echocardiogram showed moderate LV dysfunction and significant LAD wall motion territory abnormality.  A repeat EKG showed 1-2 mm anterior ST elevation.  She wished to defer heart catheterization and left AMA in order to take care of her son with MR.  She returned to the emergency department 05/17/2023 to undergo LHC.  She continued intermittent chest discomfort and described a light grabbing type sensation.  Repeat troponins were drawn and were noted to be 573-620.  She underwent LHC with PCI and DES to her mid LAD and balloon angioplasty to D2.  She was scheduled for discharge on 05/22/2023.  However the evening of 05/21/2023 she signed out AMA.  She presented back to the emergency department 05/21/2023 to get IV antibiotics for cellulitis on her face.  She was given vancomycin .  At the time of ED intake she expressed concern over being able to afford Brilinta .   She was discharged on 05/22/2023.  She was seen in follow-up 06/20/2023.  During that time she reports continued episodes of a clinching type feeling.  She reported this was on the left side of her chest and would last for 30-60 minutes and resolve on its own.  She reported that it felt like a rippling across her chest from left to right.  She reported the sensation was happening more frequently.  It would occur with exertion.  She became very tearful and expressed her frustration over her treatment at the hospital.  She stated that she was not trying to be difficult but, needed to care for her son.  She felt that she was not being listened to her or allow to advocate for herself.  She reported a sensitivity to medications.  She has been able to arrange and limited transportation through [facility] which should help with her stress.  She continues to smoke 1 pack/day.  She was interested in quitting smoking.  She expressed interest in participating in cardiac rehab.  She presented to the clinic 09/20/23 for follow-up evaluation and stated she stopped taking her medications about 2 months ago.  She reported that no one would refill her medications.  She felt that atorvastatin  increased her blood sugar.  She reported that she did not have trouble with her blood sugar until taking the medication.  She also stopped taking her metoprolol  and Brilinta .  She noted activity intolerance.  She did report being very physically active.  She continued to notice a grabbing sharp type pain daily.  She was tearful in the exam room.  I reviewed her cardiac catheterization and importance of medication compliance.  She expressed understanding.  She reported that she continued to work with mental health professionals.  I planned follow-up in 1-2 months.   She was seen in follow-up on 11/30/2023 by Lamarr Satterfield, DNP.  During that time she presented with recurrent chest pain.  She noted that she was taking nitroglycerin  more often.   She felt that her pain was related to heart pain versus generalized pain.  She noted relief from nitroglycerin .  She continued to smoke.  She was also in need of primary care for multiple issues with her skin and mouth sores.  She reported compliance with her aspirin , Brilinta , metoprolol , and atorvastatin .  She noted low energy.  Cardiac PET CT was ordered but has not yet been completed.  She presented to the clinic 01/08/24 for evaluation and stated she was in the emergency department for chest discomfort 01/06/2024.  She was seen and evaluated at [facility].  We reviewed her emergency department visit and her diagnostic test.  Her cardiac troponins were reassuring.  Her chest x-ray showed no acute processes.  Her lab work showed hyponatremia.  We reviewed the importance of reducing fluid consumption.  She expressed understanding.  I also reviewed her previous clinic visit.  She expressed understanding.  She did feel that she was  doing better with Imdur  therapy.  She continued to note intermittent episodes of sharp sticking type pain through her chest.  She reported that she was never called about her cardiac PET/CT.  I went ahead and had her schedule this.  I refiledl her nitroglycerin ,  and planned follow-up after cardiac PET/CT.  She canceled PET/CT due to lack of transportation.  She was contacted by RN for assistance with this.  She presents to the clinic today for follow-up evaluation and states***.  Today she denies shortness of breath, lower extremity edema, fatigue, palpitations, melena, hematuria, hemoptysis, diaphoresis, weakness, presyncope, syncope, orthopnea, and PND.   Coronary artery disease-denies further episodes of chest discomfort.  She presented to the emergency department 01/06/2024 with chest discomfort.  Her cardiac enzymes were reassuring.  Chest x-ray unremarkable.  Her lab work showed hyponatremia.  At that time I educated her  about drinking less water.   STEMI 7/24 with PCI and DES to mid LAD with angioplasty to D2.    Continue  aspirin ,  nitroglycerin  as needed Continue Brilinta , continue isosorbide , rosuvastatin  Heart healthy low-sodium diet Increase physical activity as tolerated Restart cardiac rehab Proceed to cardiac PET CT-was unable to do due to lack of transportation.  I will reach out to social work to facilitate this.***  HLD-reports compliance with rosuvastatin . High-fiber diet Continue rosuvastatin  Repeat fasting lipids and LFTs  Chronic systolic CHF-weight today 1***90.4.  She is euvolemic today.  7/24 echocardiogram showed an EF of 30-35% and G1 DD.  She was previously hesitant to add GDMT. Continue metoprolol  Daily weights-weight log given Heart healthy low-sodium diet Elevate lower extremities when not active Continue reduced p.o. intake  Tobacco abuse- Now smoking 1 pack per***day.  Again encouraged to stop smoking. Smoking cessation strongly encouraged   Disposition: Follow-up with Dr. Mona or me after cardiac PET/CT   Home Medications    Prior to Admission medications   Medication Sig Start Date End Date Taking? Authorizing Provider  acetaminophen  (TYLENOL ) 500 MG tablet Take 500-1,000 mg by mouth every 6 (six) hours as needed for moderate pain.    [provider]  albuterol  (VENTOLIN  HFA) 108 (90 Base) MCG/ACT inhaler Inhale 2 puffs into the lungs every 4 (four) hours as needed for wheezing or shortness of breath. 05/22/23   Pokhrel, Vernal, MD  aspirin  EC 81 MG tablet Take 1 tablet (81 mg total) by mouth daily. Swallow whole. 05/22/23   Pokhrel, Laxman, MD  atorvastatin  (LIPITOR) 80 MG tablet Take 1 tablet (80 mg total) by mouth daily. 05/22/23   Pokhrel, Laxman, MD  fluconazole  (DIFLUCAN ) 200 MG tablet Take 200 mg by mouth 2 (two) times daily. Patient not taking: Reported on 07/05/2023 06/06/23   [provider]  ibuprofen  (ADVIL ) 200 MG tablet Take 1 tablet (200  mg total) by mouth 2 (two) times daily as needed for moderate pain (pain). 05/22/23   Pokhrel, Laxman, MD  metFORMIN  (GLUCOPHAGE -XR) 500 MG 24 hr tablet Take 1 tablet (500 mg total) by mouth daily with breakfast. 07/05/23   Hilty, Vinie BROCKS, MD  metoprolol  tartrate (LOPRESSOR ) 25 MG tablet Take 1 tablet (25 mg total) by mouth 2 (two) times daily. 05/22/23   Pokhrel, Laxman, MD  oxyCODONE -acetaminophen  (PERCOCET/ROXICET) 5-325 MG tablet Take 1 tablet by mouth every 6 (six) hours as needed for severe pain. 08/11/23   Cleotilde Perkins, DO  ticagrelor  (BRILINTA ) 90 MG TABS tablet Take 1 tablet (90 mg total) by mouth 2 (two) times daily. 05/22/23   Pokhrel, Laxman, MD  Family History    No family history on file. has no family status information on file.   Social History    Social History   Socioeconomic History   Marital status: Divorced    Spouse name: Not on file   Number of children: Not on file   Years of education: Not on file   Highest education level: Not on file  Occupational History   Not on file  Tobacco Use   Smoking status: Every Day    Current packs/day: 0.50    Types: Cigarettes   Smokeless tobacco: Not on file  Substance and Sexual Activity   Alcohol use: No   Drug use: No   Sexual activity: Not on file  Other Topics Concern   Not on file  Social History Narrative   Not on file   Social Drivers of Health   Financial Resource Strain: Medium Risk (01/04/2023)   Received from [facility], [facility]   Overall Financial Resource Strain (CARDIA)    Difficulty of Paying Living Expenses: Somewhat hard  Food Insecurity: No Food Insecurity (05/18/2023)   Hunger Vital Sign    Worried About Running Out of Food in the Last Year: Never true    Ran Out of Food in the Last Year: Never true  Recent Concern: Food Insecurity - Food Insecurity Present (05/16/2023)   Hunger Vital Sign    Worried About Running Out of Food in the Last Year: Sometimes true    Ran Out of Food in the  Last Year: Sometimes true  Transportation Needs: Unmet Transportation Needs (05/18/2023)   PRAPARE - Administrator, Civil Service (Medical): Yes    Lack of Transportation (Non-Medical): Yes  Physical Activity: Unknown (01/04/2023)   Received from [facility], [facility]   Exercise Vital Sign    Days of Exercise per Week: 0 days    Minutes of Exercise per Session: Not on file  Stress: Stress Concern Present (01/04/2023)   Received from [facility], [facility]   [employer] of Occupational Health - Occupational Stress Questionnaire    Feeling of Stress : Rather much  Social Connections: Socially Isolated (01/04/2023)   Received from [facility], [facility]   Social Network    How would you rate your social network (family, work, friends)?: Little participation, lonely and socially isolated  Intimate Partner Violence: Not At Risk (05/18/2023)   Humiliation, Afraid, Rape, and Kick questionnaire    Fear of Current or Ex-Partner: No    Emotionally Abused: No    Physically Abused: No    Sexually Abused: No     Review of Systems    General:  No chills, fever, night sweats or weight changes.  Cardiovascular:  No chest pain, dyspnea on exertion, edema, orthopnea, palpitations, paroxysmal nocturnal dyspnea. Dermatological: No rash, lesions/masses Respiratory: No cough, dyspnea Urologic: No hematuria, dysuria Abdominal:   No nausea, vomiting, diarrhea, bright red blood per rectum, melena, or hematemesis Neurologic:  No visual changes, wkns, changes in mental status. All other systems reviewed and are otherwise negative except as noted above.  Physical Exam    VS:  There were no vitals taken for this visit. , BMI There is no height or weight on file to calculate BMI. GEN: Well nourished, well developed, in no acute distress. HEENT: normal. Neck: Supple, no JVD, carotid bruits, or masses. Cardiac: RRR, no murmurs, rubs, or gallops. No clubbing, cyanosis,  edema.  Radials/DP/PT 2+ and equal bilaterally.  Chest pain Respiratory:  Respirations regular and unlabored, clear to auscultation bilaterally. GI: Soft, nontender, nondistended, BS + x 4. MS: no deformity or atrophy. Skin: warm and dry, no rash. Neuro:  Strength and sensation are intact. Psych: Normal affect.  Accessory Clinical Findings    Recent Labs: 05/16/2023: TSH 0.774 05/21/2023: ALT 33 05/22/2023: BUN 14; Creatinine, Ser 0.68; Hemoglobin 13.6; Magnesium  1.5; Platelets 279; Potassium 3.6; Sodium 133   Recent Lipid Panel    Component Value Date/Time   CHOL 273 (H) 05/16/2023 0622   TRIG 645 (H) 05/16/2023 0622   HDL 32 (L) 05/16/2023 0622   CHOLHDL 8.5 05/16/2023 0622   VLDL UNABLE TO CALCULATE IF TRIGLYCERIDE OVER 400 mg/dL 92/90/7975 9377   LDLCALC UNABLE TO CALCULATE IF TRIGLYCERIDE OVER 400 mg/dL 92/90/7975 9377   LDLDIRECT 165 (H) 05/16/2023 0622    No BP recorded.  {Refresh Note OR Click here to enter BP  :1}***    ECG personally reviewed by me today-none today.   LHC 05/19/2023    2nd Diag lesion is 99% stenosed.   Mid LAD-1 lesion is 90% stenosed.   Mid LAD-2 lesion is 40% stenosed.   Dist LAD lesion is 40% stenosed.   2nd Mrg lesion is 20% stenosed.   Lat 2nd Mrg lesion is 90% stenosed.   Prox RCA lesion is 100% stenosed.   A drug-eluting stent was successfully placed using a SYNERGY XD [id number].   Balloon angioplasty was performed using a BALLN EMERGE MR [id number].   Post intervention, there is a 0% residual stenosis.   Post intervention, there is a 50% residual stenosis.   LV end diastolic pressure is normal.   Recommend uninterrupted dual antiplatelet therapy with Aspirin  81mg  daily and Ticagrelor  90mg  twice daily for a minimum of 12 months (ACS-Class I recommendation).   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 Subtotal second diagonal. Stents in OM2 are still patent. Chronic occlusion of a small nondominant RCA Normal  LVEDP Successful PCI of the mid LAD with 2.75 x 16 mm Synergy stent post dilated to 3.0 Successful balloon angioplasty of the second diagonal   Plan: DAPT for one year. Aggressive risk factor modification. Guideline directed medical therapy for LV dysfunction.  Echocardiogram 05/16/2023  IMPRESSIONS     1. There is no left ventricular thrombus (Definity  contrast was used),  but there is apical swirling/slow flow. Left ventricular ejection  fraction, by estimation, is 30 to 35%. The left ventricle has moderately  decreased function. The left ventricle  demonstrates regional wall motion abnormalities (see scoring  diagram/findings for description). Left ventricular diastolic parameters  are consistent with Grade I diastolic dysfunction (impaired relaxation).  There is mild dyskinesis of the left  ventricular, apical anteroseptal wall and anterior wall. There is severe  hypokinesis of the left ventricular, mid-apical anteroseptal wall and  anterior wall.   2. Right ventricular systolic function is normal. The right ventricular  size is normal. Tricuspid regurgitation signal is inadequate for assessing  PA pressure.   3. The mitral valve is normal in structure. No evidence of mitral valve  regurgitation.   4. The aortic valve is tricuspid. Aortic valve regurgitation is not  visualized.   5. The inferior vena cava is normal in size with greater than 50%  respiratory variability, suggesting right atrial pressure of 3 mmHg.   FINDINGS   Left Ventricle: There is no left ventricular thrombus (Definity  contrast  was used), but there is apical swirling/slow flow. Left ventricular  ejection fraction, by  estimation, is 30 to 35%. The left ventricle has  moderately decreased function. The left  ventricle demonstrates regional wall motion abnormalities. Mild dyskinesis  of the left ventricular, apical anteroseptal wall and anterior wall.  Severe hypokinesis of the left ventricular, mid-apical  anteroseptal wall  and anterior wall. Definity  contrast  agent was given IV to delineate the left ventricular endocardial borders.  The left ventricular internal cavity size was normal in size. There is no  left ventricular hypertrophy. Left ventricular diastolic parameters are  consistent with Grade I diastolic  dysfunction (impaired relaxation). Normal left ventricular filling  pressure.     LV Wall Scoring:  The apical septal segment, apical anterior segment, and apex are  dyskinetic.  The mid anteroseptal segment, apical lateral segment, mid anterior  segment,  and apical inferior segment are hypokinetic. The antero-lateral wall,  inferior wall, posterior wall, basal anteroseptal segment, mid  inferoseptal  segment, basal anterior segment, and basal inferoseptal segment are  normal.   Right Ventricle: The right ventricular size is normal. No increase in  right ventricular wall thickness. Right ventricular systolic function is  normal. Tricuspid regurgitation signal is inadequate for assessing PA  pressure.   Left Atrium: Left atrial size was normal in size.   Right Atrium: Right atrial size was normal in size.   Pericardium: There is no evidence of pericardial effusion.   Mitral Valve: The mitral valve is normal in structure. No evidence of  mitral valve regurgitation.   Tricuspid Valve: The tricuspid valve is normal in structure. Tricuspid  valve regurgitation is not demonstrated.   Aortic Valve: The aortic valve is tricuspid. Aortic valve regurgitation is  not visualized. Aortic valve mean gradient measures 4.0 mmHg. Aortic valve  peak gradient measures 6.0 mmHg. Aortic valve area, by VTI measures 2.28  cm.   Pulmonic Valve: The pulmonic valve was not well visualized. Pulmonic valve  regurgitation is not visualized. No evidence of pulmonic stenosis.   Aorta: The aortic root is normal in size and structure.   Venous: The inferior vena cava is normal in size with  greater than 50%  respiratory variability, suggesting right atrial pressure of 3 mmHg.   IAS/Shunts: No atrial level shunt detected by color flow Doppler.        Assessment & Plan   1. ***  Josefa HERO. Tinzley Dalia NP-C     04/02/2024, 9:17 PM [facility] [address] Office [phone] Fax [phone]    I spent 14 *** minutes examining this patient, reviewing medications, and using patient centered shared decision making involving her cardiac care.   I spent  20 minutes reviewing her past medical history,  medications, and prior cardiac tests.

## 2024-04-04 ENCOUNTER — Ambulatory Visit: Attending: General Practice | Admitting: General Practice

## 2024-06-21 ENCOUNTER — Encounter (HOSPITAL_COMMUNITY): Payer: Self-pay | Admitting: Adult Health

## 2024-06-25 ENCOUNTER — Other Ambulatory Visit (HOSPITAL_COMMUNITY)
